# Patient Record
Sex: Female | Born: 1990 | Hispanic: Yes | Marital: Married | Smoking: Never smoker
Health system: Southern US, Community
[De-identification: ages and names within clinical notes are randomized; demographics above are authoritative.]

## PROBLEM LIST (undated history)

## (undated) DIAGNOSIS — E039 Hypothyroidism, unspecified: Secondary | ICD-10-CM

## (undated) DIAGNOSIS — O139 Gestational [pregnancy-induced] hypertension without significant proteinuria, unspecified trimester: Secondary | ICD-10-CM

## (undated) DIAGNOSIS — E119 Type 2 diabetes mellitus without complications: Secondary | ICD-10-CM

## (undated) HISTORY — DX: Hypothyroidism, unspecified: E03.9

---

## 2012-05-22 ENCOUNTER — Encounter (HOSPITAL_COMMUNITY): Payer: Self-pay | Admitting: *Deleted

## 2012-05-22 ENCOUNTER — Inpatient Hospital Stay (HOSPITAL_COMMUNITY)
Admission: AD | Admit: 2012-05-22 | Discharge: 2012-05-22 | Disposition: A | Discharge status: Home / Self Care | Payer: Self-pay | Source: Ambulatory Visit | Attending: Obstetrics & Gynecology | Admitting: Obstetrics & Gynecology

## 2012-05-22 ENCOUNTER — Inpatient Hospital Stay (HOSPITAL_COMMUNITY): Payer: Self-pay

## 2012-05-22 DIAGNOSIS — O00109 Unspecified tubal pregnancy without intrauterine pregnancy: Secondary | ICD-10-CM | POA: Insufficient documentation

## 2012-05-22 DIAGNOSIS — O039 Complete or unspecified spontaneous abortion without complication: Secondary | ICD-10-CM | POA: Insufficient documentation

## 2012-05-22 DIAGNOSIS — O209 Hemorrhage in early pregnancy, unspecified: Secondary | ICD-10-CM | POA: Insufficient documentation

## 2012-05-22 HISTORY — DX: Type 2 diabetes mellitus without complications: E11.9

## 2012-05-22 LAB — URINALYSIS, ROUTINE W REFLEX MICROSCOPIC
Ketones, ur: NEGATIVE mg/dL
Protein, ur: NEGATIVE mg/dL
Urobilinogen, UA: 0.2 mg/dL (ref 0.0–1.0)

## 2012-05-22 LAB — WET PREP, GENITAL
Clue Cells Wet Prep HPF POC: NONE SEEN
Trich, Wet Prep: NONE SEEN
Yeast Wet Prep HPF POC: NONE SEEN

## 2012-05-22 LAB — ABO/RH: ABO/RH(D): O POS

## 2012-05-22 LAB — CBC WITH DIFFERENTIAL/PLATELET
Basophils Absolute: 0 10*3/uL (ref 0.0–0.1)
Basophils Relative: 0 % (ref 0–1)
Eosinophils Absolute: 0.1 10*3/uL (ref 0.0–0.7)
Hemoglobin: 13.3 g/dL (ref 12.0–15.0)
MCHC: 34.3 g/dL (ref 30.0–36.0)
Neutro Abs: 4.8 10*3/uL (ref 1.7–7.7)
Neutrophils Relative %: 60 % (ref 43–77)
Platelets: 249 10*3/uL (ref 150–400)
RDW: 13.5 % (ref 11.5–15.5)

## 2012-05-22 LAB — URINE MICROSCOPIC-ADD ON

## 2012-05-22 IMAGING — US US OB COMP LESS 14 WK
1 series · 14 of 28 positions shown · non-contrast
Comparison: None.

CLINICAL DATA: Early pregnancy, vaginal bleeding, beta HCG 94

OBSTETRIC <14 WK US AND TRANSVAGINAL OB US
TECHNIQUE: Both transabdominal and transvaginal ultrasound
examinations were performed for complete evaluation of the
gestation as well as the maternal uterus, adnexal regions, and
pelvic cul-de-sac.  Transvaginal technique was performed to assess
early pregnancy.

[Series 1: us ob comp less 14 wks · 14 of 48 slices shown]
[im 2/48]
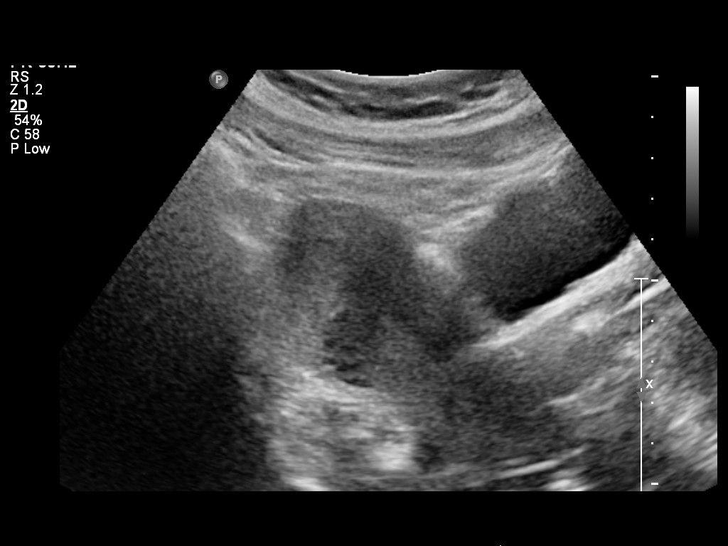
[im 6/48]
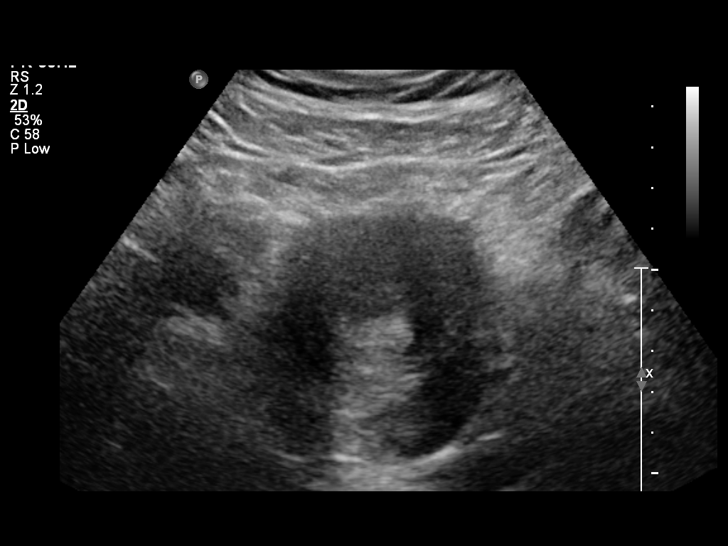
[im 9/48]
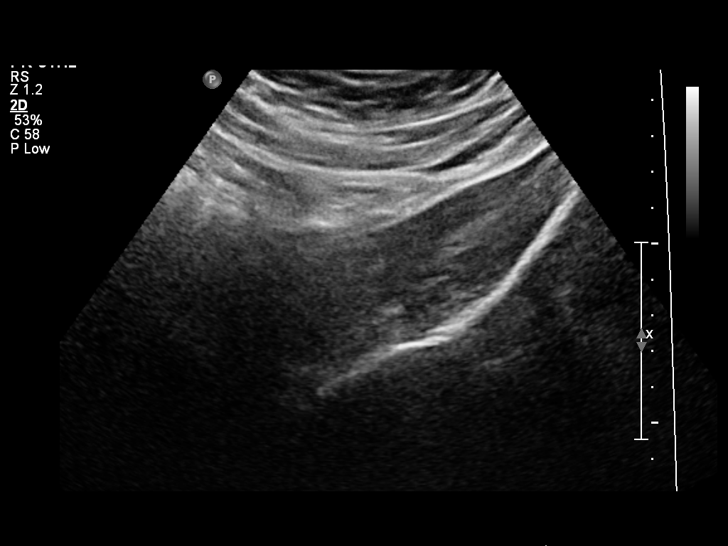
[im 13/48]
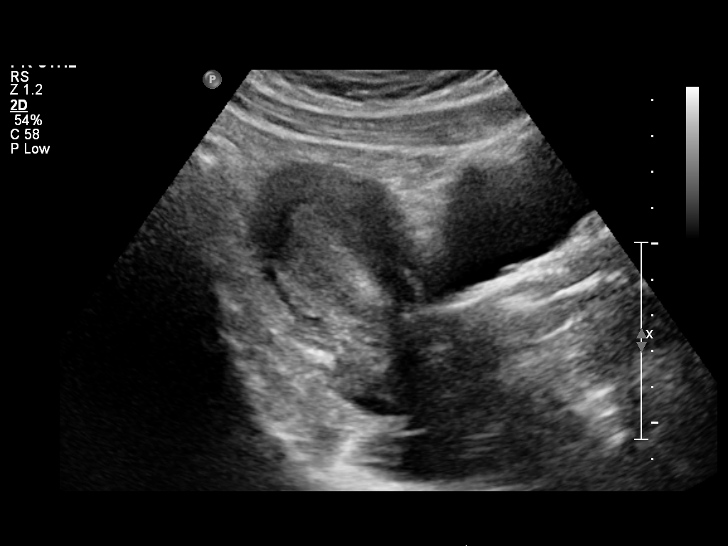
[im 16/48]
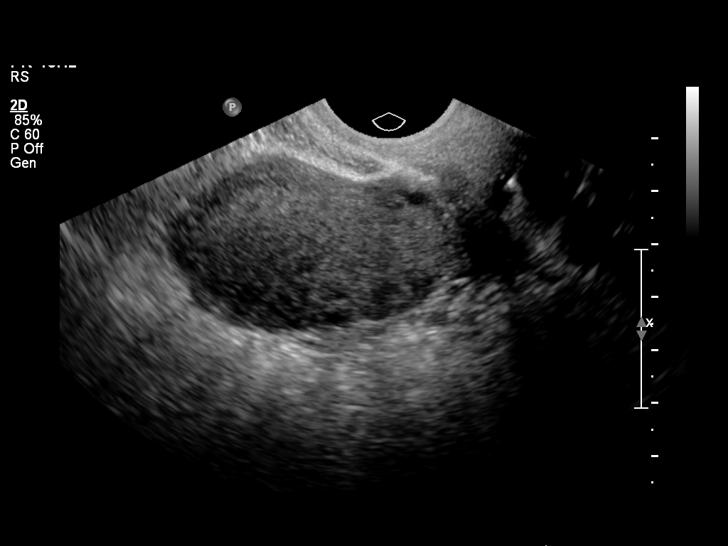
[im 20/48]
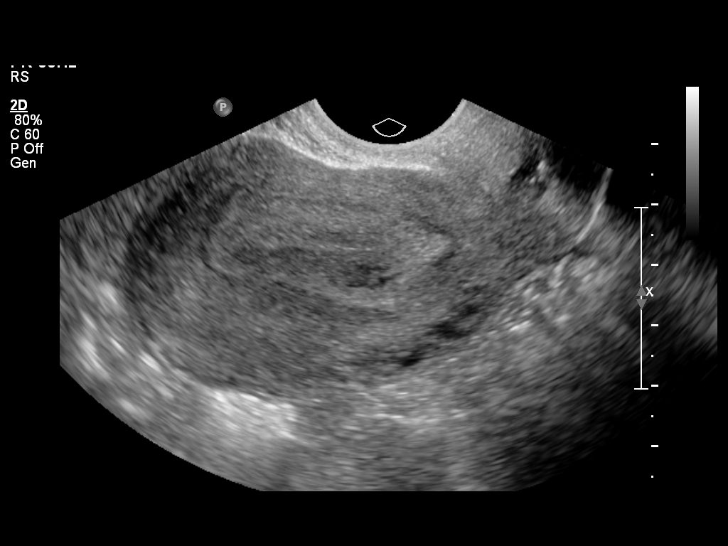
[im 23/48]
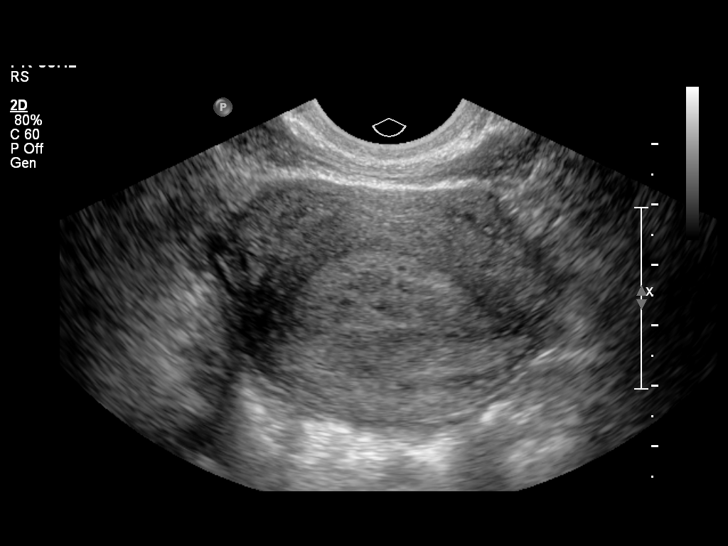
[im 27/48]
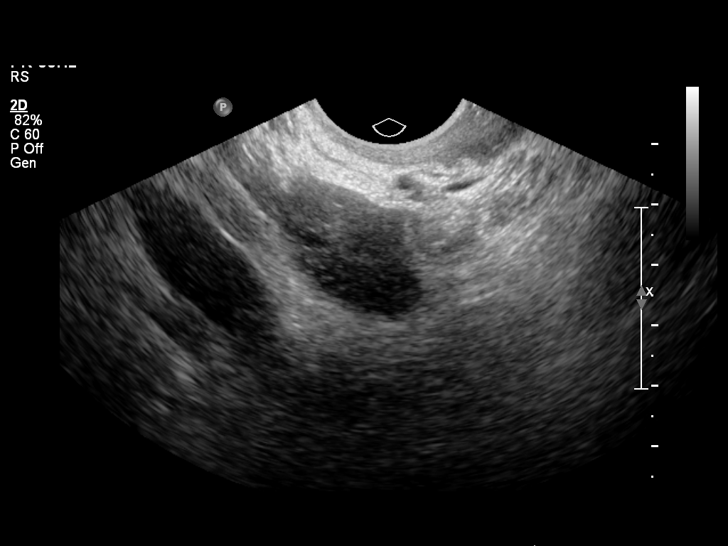
[im 30/48]
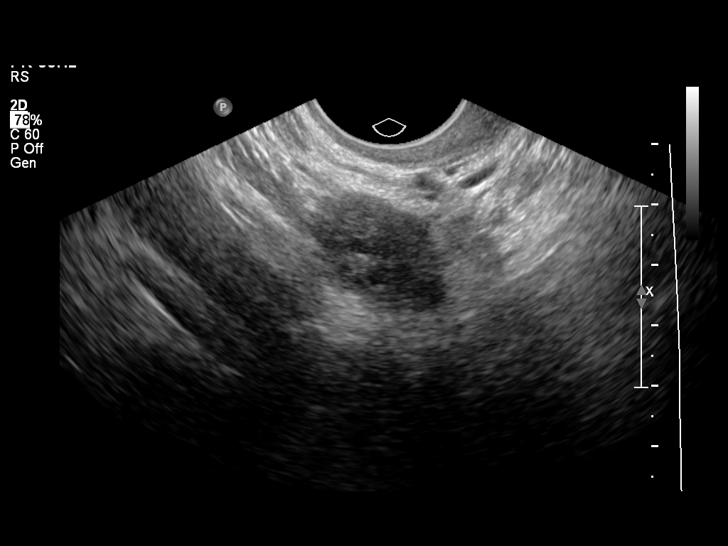
[im 34/48]
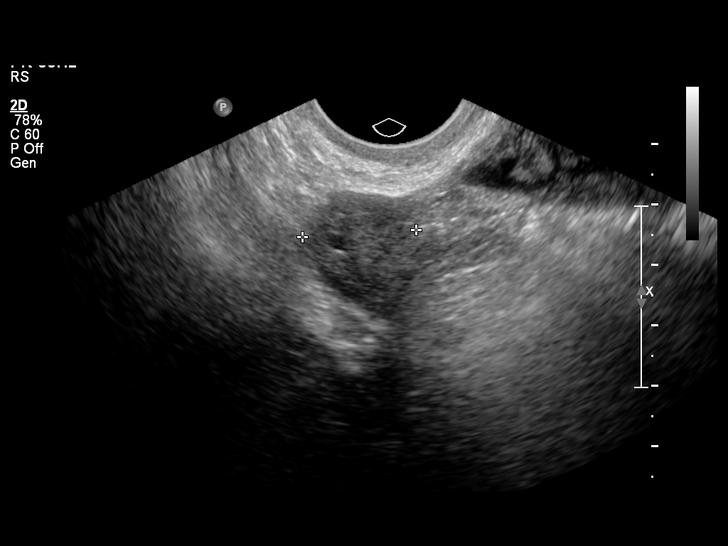
[im 37/48]
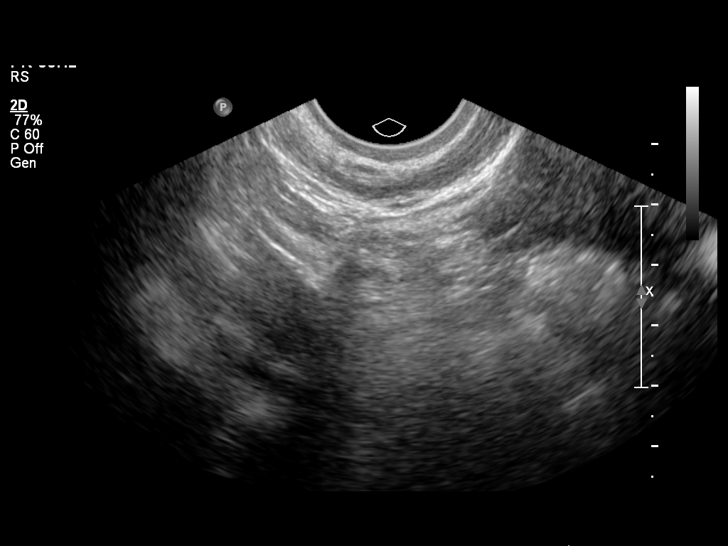
[im 41/48]
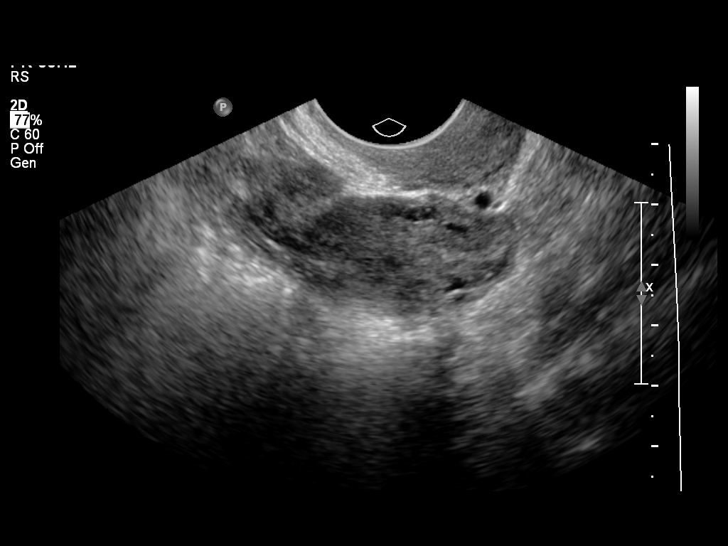
[im 44/48]
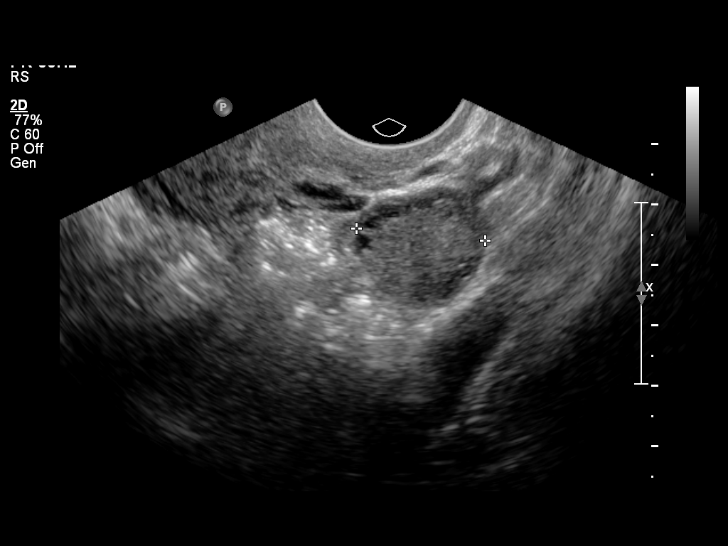
[im 48/48]
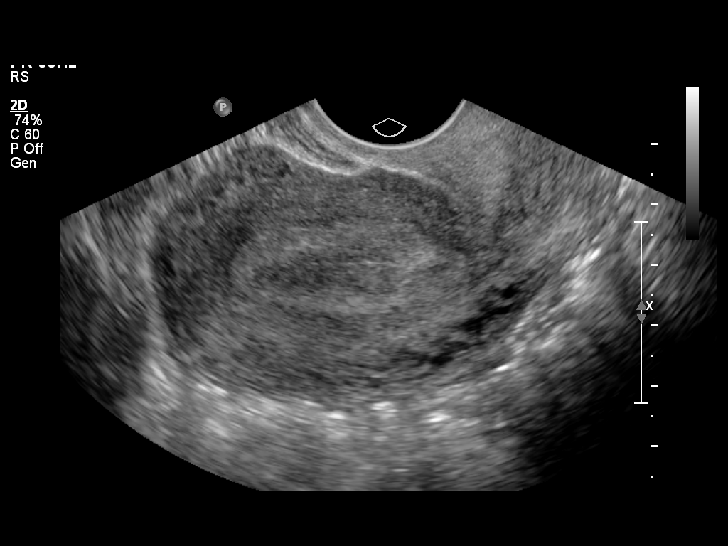

[14 of 28 positions shown; findings below may reference images not displayed]

Intrauterine gestational sac:  Not visualized.

Maternal uterus/adnexae:
[place] complex measures 1.5 cm in thickness.

Right ovary is within normal limits, measuring 2.8 x 1.9 x 1.9 cm.

Left ovary is within normal limits, measuring 2.0 x 3.3 x 2.1 cm.

No free fluid.
IMPRESSION: No IUP is seen.  This is not unexpected given the low beta HCG.

By definition, this reflects a pregnancy of unknown location.
Differential considerations include early IUP, abnormal IUP, or
nonvisualized ectopic pregnancy.

Serial beta HCG is suggested, supplemented by repeat ultrasound in
14 days.

## 2012-05-22 NOTE — MAU Provider Note (Signed)
Attestation of Attending Supervision of Advanced Practitioner (CNM/NP): Evaluation and management procedures were performed by the Advanced Practitioner under my supervision and collaboration. I have reviewed the Advanced Practitioner's note and chart, and I agree with the management and plan.  Texie Tupou H. 5:28 PM   

## 2012-05-22 NOTE — MAU Provider Note (Signed)
History     CSN: [id number]  Arrival date and time: 05/22/12 1451   None     Chief Complaint  Patient presents with  . Vaginal Bleeding   HPI Regina Jones Regina Jones is a 21 y.o. female who presents to MAU with vaginal bleeding.  She is early pregnant. The bleeding started 2 days ago. She describes the bleeding as less than a period.  Associated symptoms include lower abdominal pain that she rates as 4/10. The pain comes and goes. Last pap smear less than one year and was normal. Current sex partner x 6 years. No history of STI's. The history was provided by the patient using the Jones Spanish translator.  OB History    Grav Para Term Preterm Abortions TAB SAB Ect Mult Living   1               Past Medical History  Diagnosis Date  . Diabetes mellitus without complication   . Thyroid condition     History reviewed. No pertinent past surgical history.  History reviewed. No pertinent family history.  History  Substance Use Topics  . Smoking status: Never Smoker   . Smokeless tobacco: Not on file  . Alcohol Use: No    Allergies: No Known Allergies  Prescriptions prior to admission  Medication Sig Dispense Refill  . levothyroxine (SYNTHROID, LEVOTHROID) 25 MCG tablet Take 25 mcg by mouth daily.      . Prenatal Vit-Fe Fumarate-FA (PRENATAL MULTIVITAMIN) TABS Take 1 tablet by mouth at bedtime.        Review of Systems  Constitutional: Negative for fever, chills and weight loss.  HENT: Negative for ear pain, nosebleeds, congestion, sore throat and neck pain.   Eyes: Negative for blurred vision, double vision, photophobia and pain.  Respiratory: Negative for cough, shortness of breath and wheezing.   Cardiovascular: Negative for chest pain, palpitations and leg swelling.  Gastrointestinal: Positive for heartburn and abdominal pain. Negative for nausea, vomiting, diarrhea and constipation.  Genitourinary: Positive for dysuria and frequency. Negative for urgency.    Musculoskeletal: Positive for back pain. Negative for myalgias.  Skin: Negative for itching and rash.  Neurological: Negative for dizziness, sensory change, speech change, seizures, weakness and headaches.  Endo/Heme/Allergies: Does not bruise/bleed easily.  Psychiatric/Behavioral: Negative for depression. The patient is not nervous/anxious and does not have insomnia.    Physical Exam   Blood pressure 131/88, pulse 71, temperature 98.2 F (36.8 C), temperature source Oral, resp. rate 16, last menstrual period 03/19/2012.  Physical Exam  Nursing note and vitals reviewed. Constitutional: She is oriented to person, place, and time. She appears well-developed and well-nourished. No distress.  HENT:  Head: Normocephalic and atraumatic.  Eyes: EOM are normal.  Neck: Neck supple.  Cardiovascular: Normal rate.   Respiratory: Effort normal.  GI: Soft. There is no tenderness.  Genitourinary:       External genitalia without lesions. Small blood vaginal vault. Cervix inflamed, blood tinged mucous discharge from cervix. No CMT, no adnexal tenderness. Uterus without palpable enlargement.  Musculoskeletal: Normal range of motion.  Neurological: She is alert and oriented to person, place, and time.  Skin: Skin is warm and dry.  Psychiatric: She has a normal mood and affect. Her behavior is normal. Judgment and thought content normal.   Results for orders placed during the Jones encounter of 05/22/12 (from the past 24 hour(s))  URINALYSIS, ROUTINE W REFLEX MICROSCOPIC     Status: Abnormal   Collection Time   05/22/12  3:00 PM      Component Value Range   Color, Urine YELLOW  YELLOW   APPearance CLEAR  CLEAR   Specific Gravity, Urine 1.010  1.005 - 1.030   pH 6.0  5.0 - 8.0   Glucose, UA NEGATIVE  NEGATIVE mg/dL   Hgb urine dipstick TRACE (*) NEGATIVE   Bilirubin Urine NEGATIVE  NEGATIVE   Ketones, ur NEGATIVE  NEGATIVE mg/dL   Protein, ur NEGATIVE  NEGATIVE mg/dL   Urobilinogen, UA 0.2   0.0 - 1.0 mg/dL   Nitrite NEGATIVE  NEGATIVE   Leukocytes, UA NEGATIVE  NEGATIVE  URINE MICROSCOPIC-ADD ON     Status: Abnormal   Collection Time   05/22/12  3:00 PM      Component Value Range   Squamous Epithelial / LPF FEW (*) RARE   WBC, UA 0-2  <3 WBC/hpf   RBC / HPF 0-2  <3 RBC/hpf   Bacteria, UA FEW (*) RARE  POCT PREGNANCY, URINE     Status: Normal   Collection Time   05/22/12  3:11 PM      Component Value Range   Preg Test, Ur NEGATIVE  NEGATIVE  WET PREP, GENITAL     Status: Abnormal   Collection Time   05/22/12  3:45 PM      Component Value Range   Yeast Wet Prep HPF POC NONE SEEN  NONE SEEN   Trich, Wet Prep NONE SEEN  NONE SEEN   Clue Cells Wet Prep HPF POC NONE SEEN  NONE SEEN   WBC, Wet Prep HPF POC FEW (*) NONE SEEN  CBC WITH DIFFERENTIAL     Status: Normal   Collection Time   05/22/12  3:51 PM      Component Value Range   WBC 7.9  4.0 - 10.5 K/uL   RBC 4.11  3.87 - 5.11 MIL/uL   Hemoglobin 13.3  12.0 - 15.0 g/dL   HCT 16.1  09.6 - 04.5 %   MCV 94.4  78.0 - 100.0 fL   MCH 32.4  26.0 - 34.0 pg   MCHC 34.3  30.0 - 36.0 g/dL   RDW 40.9  81.1 - 91.4 %   Platelets 249  150 - 400 K/uL   Neutrophils Relative 60  43 - 77 %   Neutro Abs 4.8  1.7 - 7.7 K/uL   Lymphocytes Relative 31  12 - 46 %   Lymphs Abs 2.5  0.7 - 4.0 K/uL   Monocytes Relative 6  3 - 12 %   Monocytes Absolute 0.5  0.1 - 1.0 K/uL   Eosinophils Relative 2  0 - 5 %   Eosinophils Absolute 0.1  0.0 - 0.7 K/uL   Basophils Relative 0  0 - 1 %   Basophils Absolute 0.0  0.0 - 0.1 K/uL  HCG, QUANTITATIVE, PREGNANCY     Status: Abnormal   Collection Time   05/22/12  3:51 PM      Component Value Range   hCG, Beta Chain, Quant, S 94 (*) <5 mIU/mL  ABO/RH     Status: Normal (Preliminary result)   Collection Time   05/22/12  3:51 PM      Component Value Range   ABO/RH(D) O POS     MAU Course  Procedures Ultrasound shows no IUP or adnexal mass  Assessment: 21 y.o. female with bleeding in early  pregnancy  Diff Dx: Early IUP   SAB   Ectopic pregnancy  Plan:  Repeat Bhcg in  48 hours   Ectopic precautions  Regina Lesesne, RN, FNP, [facility] 05/22/2012, 4:42 PM

## 2012-05-23 ENCOUNTER — Inpatient Hospital Stay (HOSPITAL_COMMUNITY)
Admission: AD | Admit: 2012-05-23 | Discharge: 2012-05-23 | Disposition: A | Discharge status: Home / Self Care | Payer: Self-pay | Source: Ambulatory Visit | Attending: Obstetrics & Gynecology | Admitting: Obstetrics & Gynecology

## 2012-05-23 ENCOUNTER — Encounter (HOSPITAL_COMMUNITY): Payer: Self-pay

## 2012-05-23 DIAGNOSIS — O039 Complete or unspecified spontaneous abortion without complication: Secondary | ICD-10-CM | POA: Insufficient documentation

## 2012-05-23 LAB — HCG, QUANTITATIVE, PREGNANCY: hCG, Beta Chain, Quant, S: 70 m[IU]/mL — ABNORMAL HIGH (ref <5)

## 2012-05-23 MED ORDER — IBUPROFEN 600 MG PO TABS: 600.0000 mg | ORAL_TABLET | Freq: Four times a day (QID) | ORAL | Status: DC | PRN

## 2012-05-23 NOTE — MAU Note (Signed)
Patient is in with c/o increased abdominal pain and bleeding (the pad that she placed at 0400am at home have streak of blood). She was seen yesterday with the low bhcg and no iup visualized in ultrasound. Her abdomen is non tender to touch.

## 2012-05-23 NOTE — MAU Provider Note (Signed)
History   Chief Complaint:  Vaginal Bleeding   Regina Jones Regina Jones is  21 y.o. G1P0 Patient's last menstrual period was 03/19/2012.Marland Kitchen Seen in MAU yesterday for abd pain and bleeding in early pregnancy. US showed nothing in uterus or adnexa. Patient is here for worsening bleeding, passing clots and cramping, now significantly improved and ongoing surveillance of pregnancy status. She is [redacted]w[redacted]d weeks gestation  by LMP.  Pain 5/10 on pain scale.   General ROS:  negative  Her previous Quantitative HCG values are:  05/22/12: 94   Physical Exam   Blood pressure 140/80, pulse 73, temperature 97 F (36.1 C), temperature source Oral, resp. rate 18, last menstrual period 03/19/2012.  Focused Gynecological Exam: examination not indicated  Labs: Results for orders placed during the hospital encounter of 05/23/12 (from the past 24 hour(s))  HCG, QUANTITATIVE, PREGNANCY     Status: Abnormal   Collection Time   05/23/12  6:18 AM      Component Value Range   hCG, Beta Chain, Quant, S 70 (*) <5 mIU/mL   Ultrasound Studies:   NA  Assessment: 1. SAB (spontaneous abortion)   2.  IUP not confirmed  Plan: Follow-up Information    Follow up with [facility]. In 1 week. (for bloodwork) until quant hcg <1   Contact information:   [address] [place] [address] [phone]      Follow up with [facility] [place] [facility]ERN[facility] ADMISSIONS. (As needed if symptoms worsen)    Contact information:   [address] [id number] mc [place] [address] [phone]          Medication List     As of 05/23/2012  7:48 AM    START taking these medications         ibuprofen 600 MG tablet   Commonly known as: ADVIL,MOTRIN   Take 1 tablet (600 mg total) by mouth every 6 (six) hours as needed for pain.      CONTINUE taking these medications         levothyroxine 25 MCG tablet   Commonly known as: SYNTHROID, LEVOTHROID     prenatal multivitamin Tabs          Where to get your medications    These are the prescriptions that you need to pick up.   You may get these medications from any pharmacy.         ibuprofen 600 MG tablet            Regina Jones 05/23/2012, 6:05 AM

## 2012-05-24 LAB — POCT PREGNANCY, URINE: Preg Test, Ur: POSITIVE — AB

## 2012-05-31 ENCOUNTER — Other Ambulatory Visit: Payer: Self-pay

## 2012-05-31 DIAGNOSIS — O039 Complete or unspecified spontaneous abortion without complication: Secondary | ICD-10-CM

## 2012-06-07 NOTE — MAU Provider Note (Signed)
Attestation of Attending Supervision of Advanced Practitioner (CNM/NP): Evaluation and management procedures were performed by the Advanced Practitioner under my supervision and collaboration. I have reviewed the Advanced Practitioner's note and chart, and I agree with the management and plan.  LEGGETT,KELLY H. 9:23 PM

## 2012-06-16 ENCOUNTER — Ambulatory Visit (INDEPENDENT_AMBULATORY_CARE_PROVIDER_SITE_OTHER): Payer: Self-pay | Admitting: Obstetrics and Gynecology

## 2012-06-16 ENCOUNTER — Encounter: Payer: Self-pay | Admitting: Obstetrics and Gynecology

## 2012-06-16 VITALS — BP 125/84 | HR 81 | Temp 96.6°F | Resp 20 | Wt 154.1 lb

## 2012-06-16 DIAGNOSIS — O039 Complete or unspecified spontaneous abortion without complication: Secondary | ICD-10-CM

## 2012-06-16 NOTE — Progress Notes (Signed)
Pt here for f/u SAB

## 2012-06-16 NOTE — Patient Instructions (Signed)
Aborto espontneo  (Miscarriage) El aborto espontneo es la prdida de un beb que no ha nacido (feto) antes de la semana 20 del embarazo. La mayor parte de estos abortos ocurre en los primeros 3 meses. En algunos casos ocurre antes de que la mujer sepa que est embarazada. Tambin se denomina "aborto espontneo" o "prdida prematura del embarazo". El aborto espontneo puede ser una experiencia que afecte emocionalmente a la persona. Converse con su mdico si tiene dudas, cmo es el proceso de duelo, y sobre planes futuros de embarazo.  CAUSAS   Algunos problemas cromosmicos pueden hacer imposible que el beb se desarrolle normalmente. Los problemas con los genes o cromosomas del beb son generalmente el resultado de errores que se producen, por casualidad, cuando el embrin se divide y crece. Estos problemas no se heredan de los padres.  Infeccin en el cuello del tero.   Problemas hormonales.   Problemas en el cuello del tero, como tener un tero incompetente. Esto ocurre cuando los tejidos no son lo suficientemente fuertes como para contener el embarazo.   Problemas del tero, como un tero con forma anormal, los fibromas o anormalidades congnitas.   Ciertas enfermedades crnicas.   No fume, no beba alcohol, ni consuma drogas.   Traumatismos  A veces, la causa es desconocida.  SNTOMAS   Sangrado o manchado vaginal, con o sin clicos o dolor.  Dolor o clicos en el abdomen o en la cintura.  Eliminacin de lquido, tejidos o cogulos grandes por la vagina. DIAGNSTICO  El mdico le har un examen fsico. Tambin le indicar una ecografa para confirmar el aborto. Es posible que se realicen anlisis de sangre.  TRATAMIENTO   En algunos casos el tratamiento no es necesario, si se eliminan naturalmente todos los tejidos embrionarios que se encontraban en el tero. Si el feto o la placenta quedan dentro del tero (aborto incompleto), pueden infectarse, los tejidos que quedan  pueden infectarse y deben retirarse. Generalmente se realiza un procedimiento de dilatacin y curetaje (D y C). Durante el procedimiento de dilatacin y curetaje, el cuello del tero se abre (dilata) y se retira cualquier resto de tejido fetal o placentario del tero.  Si hay una infeccin, le recetarn antibiticos. Podrn recetarle otros medicamentos para reducir el tamao del tero (contraerlo) si hay una mucho sangrado.  Si su sangre es Rh negativa y su beb es Rh positivo, usted necesitar la inyeccin de inmunoglobulina Rh. Esta inyeccin proteger a los futuros bebs de tener problemas de compatibilidad Rh en futuros embarazos. INSTRUCCIONES PARA EL CUIDADO EN EL HOGAR   El mdico le indicar reposo en cama o le permitir realizar actividades livianas. Vuelva a la actividad lentamente o segn las indicaciones de su mdico.  Pdale a alguien que la ayude con las responsabilidades familiares y del hogar durante este tiempo.   Lleve un registro de la cantidad y la saturacin de las toallas higinicas que utiliza cada da. Anote esta informacin   No use tampones. No No se haga duchas vaginales ni tenga relaciones sexuales hasta que el mdico la autorice.   Slo tome medicamentos de venta libre o recetados para calmar el dolor o el malestar, segn las indicaciones de su mdico.   No tome aspirina. La aspirina puede ocasionar hemorragias.   Concurra puntualmente a las citas de control con el mdico.   Si usted o su pareja tienen dificultades con el duelo, hable con su mdico para buscar la ayuda psicolgica que los ayude a enfrentar la prdida   del embarazo. Permtase el tiempo suficiente de duelo antes de quedar embarazada nuevamente.  SOLICITE ATENCIN MDICA DE INMEDIATO SI:   Siente calambres intensos o dolor en la espalda o en el abdomen.  Tiene fiebre.  Elimina grandes cogulos de sangre (del tamao de una nuez o ms) o tejidos por la vagina. Guarde lo que ha eliminado para  que su mdico lo examine.   La hemorragia aumenta.   Observa una secrecin vaginal espesa y con mal olor.  Se siente mareada, dbil, o se desmaya.   Siente escalofros.  ASEGRESE DE QUE:   Comprende estas instrucciones.  Controlar su enfermedad.  Solicitar ayuda de inmediato si no mejora o si empeora. Document Released: 04/02/2005 Document Revised: 12/23/2011 [employer] Patient Information 2013 [employer].  

## 2012-06-17 NOTE — Progress Notes (Signed)
CC: Referral    None     HPI Regina Jones Regina Jones is a 21 y.o. G1P0010 who is here for F/U probable SAB. Had hx bleeding heavier than a period then came to MAU and had quant of 70 and [place] as below.  Past Medical History  Diagnosis Date  . Thyroid condition   . Diabetes mellitus without complication     OB History    Grav Para Term Preterm Abortions TAB SAB Ect Mult Living   1              # Outc Date GA Lbr Len/2nd Wgt Sex Del Anes PTL Lv   1 GRA            Comments: System Generated. Please review and update pregnancy details.      History reviewed. No pertinent past surgical history.  History   Social History  . Marital Status: Married    Spouse Name: N/A    Number of Children: N/A  . Years of Education: N/A   Occupational History  . Not on file.   Social History Main Topics  . Smoking status: Never Smoker   . Smokeless tobacco: Not on file  . Alcohol Use: No  . Drug Use: No  . Sexually Active: Not Currently    Birth Control/ Protection: None     Comment: desires to conceive in6 mos- will use condoms   Other Topics Concern  . Not on file   Social History Narrative  . No narrative on file    Current Outpatient Prescriptions on File Prior to Visit  Medication Sig Dispense Refill  . levothyroxine (SYNTHROID, LEVOTHROID) 25 MCG tablet Take 25 mcg by mouth daily.      . metFORMIN (GLUCOPHAGE) 500 MG tablet Take 500 mg by mouth 2 (two) times daily with a meal.      . Prenatal Vit-Fe Fumarate-FA (PRENATAL MULTIVITAMIN) TABS Take 1 tablet by mouth at bedtime.      Marland Kitchen ibuprofen (ADVIL,MOTRIN) 600 MG tablet Take 1 tablet (600 mg total) by mouth every 6 (six) hours as needed for pain.  30 tablet  1    No Known Allergies  ROS Pertinent items in HPI  PHYSICAL EXAM Filed Vitals:   06/16/12 1550  BP: 125/84  Pulse: 81  Temp: 96.6 F (35.9 C)  Resp: 20   General: Well nourished, well developed female in no acute distress Cardiovascular: Normal  rate Respiratory: Normal effort Abdomen: Soft, nontender Back: No CVAT Extremities: No edema Neurologic: Alert and oriented Speculum exam: NEFG; vagina with physiologic discharge, no blood; cervix clean Bimanual exam: cervix closed, no CMT; uterus NSSP; no adnexal tenderness or masses  LAB RESULTS No results found for this or any previous visit (from the past 24 hour(s)).  IMAGING US Ob Comp Less 14 Wks  05/22/2012  *RADIOLOGY REPORT*  Clinical Data: Early pregnancy, vaginal bleeding, beta HCG 94  OBSTETRIC <14 WK [place] AND TRANSVAGINAL OB US  Technique:  Both transabdominal and transvaginal ultrasound examinations were performed for complete evaluation of the gestation as well as the maternal uterus, adnexal regions, and pelvic cul-de-sac.  Transvaginal technique was performed to assess early pregnancy.  Comparison:  None.  Intrauterine gestational sac:  Not visualized.  Maternal uterus/adnexae: Regina Jones measures 1.5 cm in thickness.  Right ovary is within normal limits, measuring 2.8 x 1.9 x 1.9 cm.  Left ovary is within normal limits, measuring 2.0 x 3.3 x 2.1 cm.  No free fluid.  IMPRESSION: No IUP is seen.  This is not unexpected given the low beta HCG.  By definition, this reflects a pregnancy of unknown location. Differential considerations include early IUP, abnormal IUP, or nonvisualized ectopic pregnancy.  Serial beta HCG is suggested, supplemented by repeat ultrasound in 14 days.   Original Report Authenticated By: Regina Jones, M.D.    US Ob Transvaginal  05/22/2012  *RADIOLOGY REPORT*  Clinical Data: Early pregnancy, vaginal bleeding, beta HCG 94  OBSTETRIC <14 WK [place] AND TRANSVAGINAL OB US  Technique:  Both transabdominal and transvaginal ultrasound examinations were performed for complete evaluation of the gestation as well as the maternal uterus, adnexal regions, and pelvic cul-de-sac.  Transvaginal technique was performed to assess early pregnancy.  Comparison:  None.   Intrauterine gestational sac:  Not visualized.  Maternal uterus/adnexae: Regina Jones measures 1.5 cm in thickness.  Right ovary is within normal limits, measuring 2.8 x 1.9 x 1.9 cm.  Left ovary is within normal limits, measuring 2.0 x 3.3 x 2.1 cm.  No free fluid.  IMPRESSION: No IUP is seen.  This is not unexpected given the low beta HCG.  By definition, this reflects a pregnancy of unknown location. Differential considerations include early IUP, abnormal IUP, or nonvisualized ectopic pregnancy.  Serial beta HCG is suggested, supplemented by repeat ultrasound in 14 days.   Original Report Authenticated By: Regina Jones, M.D.      ASSESSMENT  1. SAB (spontaneous abortion)     PLAN Quant bHCG sent. Will follow to 0. Discussed waiting 1 menstrual cycle before attempting another pregnancy and continue PNVs.  See AVS for patient education.      Regina Jones, CNM 06/17/2012  Addendum: Regina Jones is <2 and pt will be called that no more F/U blood work needed.  8:23 PM

## 2012-06-18 ENCOUNTER — Telehealth: Payer: Self-pay | Admitting: *Deleted

## 2012-06-18 NOTE — Telephone Encounter (Signed)
Called patient with Regina Jones, her voicemail is not set up.

## 2012-06-18 NOTE — Telephone Encounter (Signed)
Message copied by Mannie Stabile on Fri Jun 18, 2012 11:04 AM ------      Message from: Odelia Gage A      Created: Fri Jun 18, 2012  7:43 AM       This was sent to the [facility]. Call her with an appointment, and we can put it in if it's just a lab                        ----- Message -----         From: Danae Orleans, CNM         Sent: 06/17/2012   8:25 PM           To: [facility] Admin Pool            Please call to tell her the [name] is down and she will not need F/U

## 2012-06-23 NOTE — Telephone Encounter (Signed)
Called pt with Spanish interpreter, Byrd Hesselbach, and informed pt that her levels are where they should be and she does not need to come in for a f/u.  Pt stated understanding and did not have any further questions.

## 2012-08-26 ENCOUNTER — Other Ambulatory Visit (HOSPITAL_COMMUNITY): Payer: Self-pay | Admitting: Obstetrics

## 2012-08-26 DIAGNOSIS — O3680X0 Pregnancy with inconclusive fetal viability, not applicable or unspecified: Secondary | ICD-10-CM

## 2012-08-31 ENCOUNTER — Encounter (HOSPITAL_COMMUNITY): Payer: Self-pay

## 2012-08-31 ENCOUNTER — Ambulatory Visit (HOSPITAL_COMMUNITY)
Admission: RE | Admit: 2012-08-31 | Discharge: 2012-08-31 | Disposition: A | Discharge status: Home / Self Care | Payer: Self-pay | Source: Ambulatory Visit | Attending: Obstetrics | Admitting: Obstetrics

## 2012-08-31 VITALS — BP 131/84 | HR 82 | Wt 155.8 lb

## 2012-08-31 DIAGNOSIS — O24911 Unspecified diabetes mellitus in pregnancy, first trimester: Secondary | ICD-10-CM

## 2012-08-31 DIAGNOSIS — E039 Hypothyroidism, unspecified: Secondary | ICD-10-CM | POA: Insufficient documentation

## 2012-08-31 DIAGNOSIS — O3680X0 Pregnancy with inconclusive fetal viability, not applicable or unspecified: Secondary | ICD-10-CM

## 2012-08-31 DIAGNOSIS — E079 Disorder of thyroid, unspecified: Secondary | ICD-10-CM | POA: Insufficient documentation

## 2012-08-31 DIAGNOSIS — O24919 Unspecified diabetes mellitus in pregnancy, unspecified trimester: Secondary | ICD-10-CM | POA: Insufficient documentation

## 2012-08-31 DIAGNOSIS — Z3689 Encounter for other specified antenatal screening: Secondary | ICD-10-CM | POA: Insufficient documentation

## 2012-08-31 LAB — COMPREHENSIVE METABOLIC PANEL
AST: 17 U/L (ref 0–37)
Alkaline Phosphatase: 53 U/L (ref 39–117)
BUN: 7 mg/dL (ref 6–23)
CO2: 24 mEq/L (ref 19–32)
Chloride: 104 mEq/L (ref 96–112)
Creatinine, Ser: 0.56 mg/dL (ref 0.50–1.10)
GFR calc non Af Amer: >90 mL/min (ref >90)
Potassium: 3.4 mEq/L — ABNORMAL LOW (ref 3.5–5.1)
Total Bilirubin: 0.1 mg/dL — ABNORMAL LOW (ref 0.3–1.2)

## 2012-08-31 LAB — TSH: TSH: 2.95 u[IU]/mL (ref 0.350–4.500)

## 2012-08-31 LAB — T4, FREE: Free T4: 1.21 ng/dL (ref 0.80–1.80)

## 2012-08-31 IMAGING — US US OB COMP LESS 14 WK
1 series · 13 of 19 positions shown · non-contrast
Comparison: none

[Series 1: us ob comp less 14 wk · 0.16mm/px · 13 of 19 slices shown]
[im 1/19]
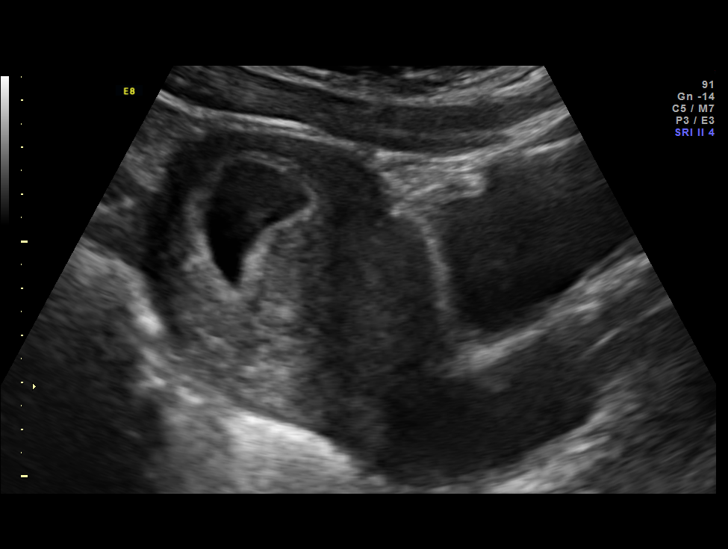
[im 3/19]
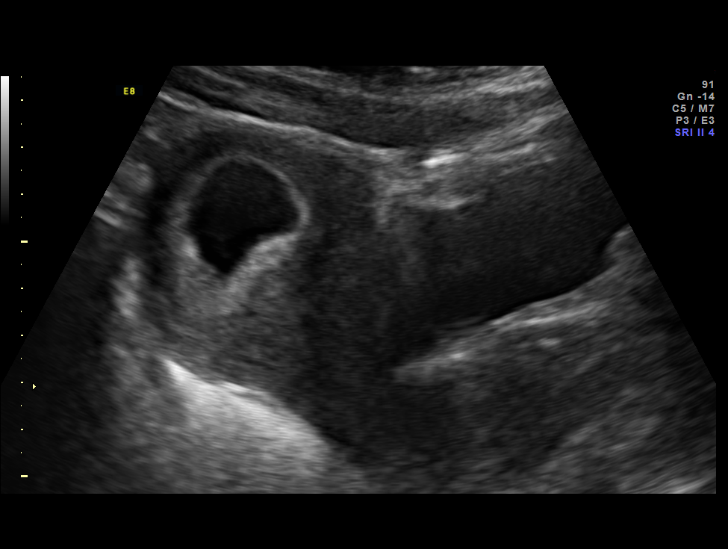
[im 4/19]
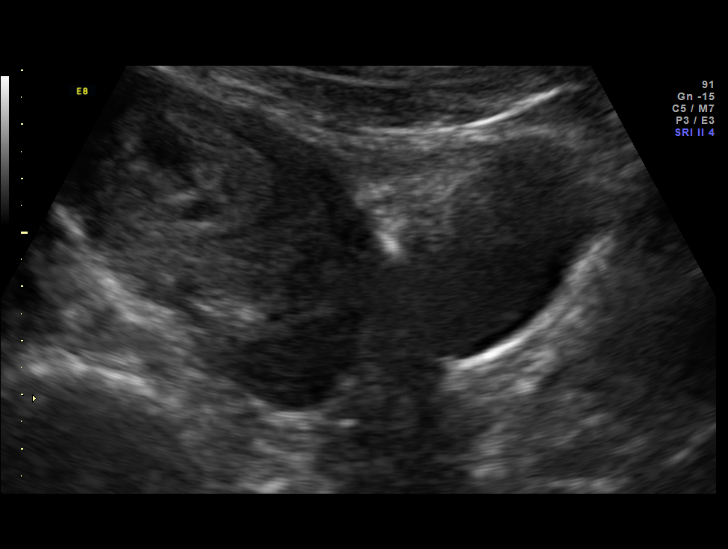
[im 6/19]
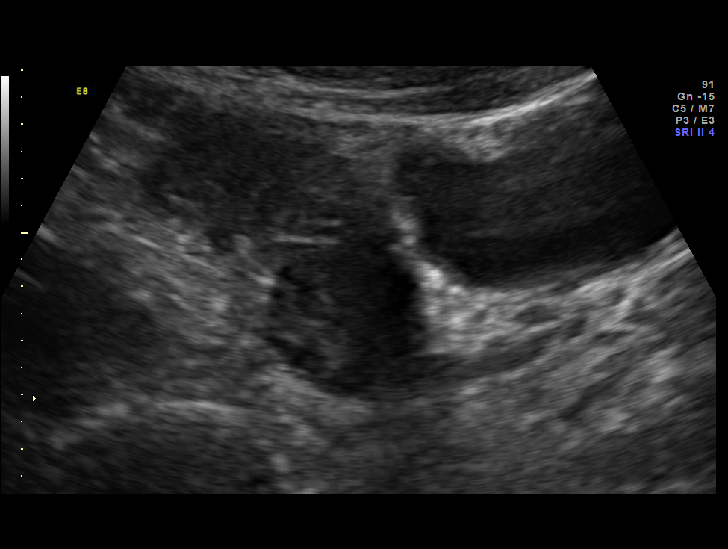
[im 7/19]
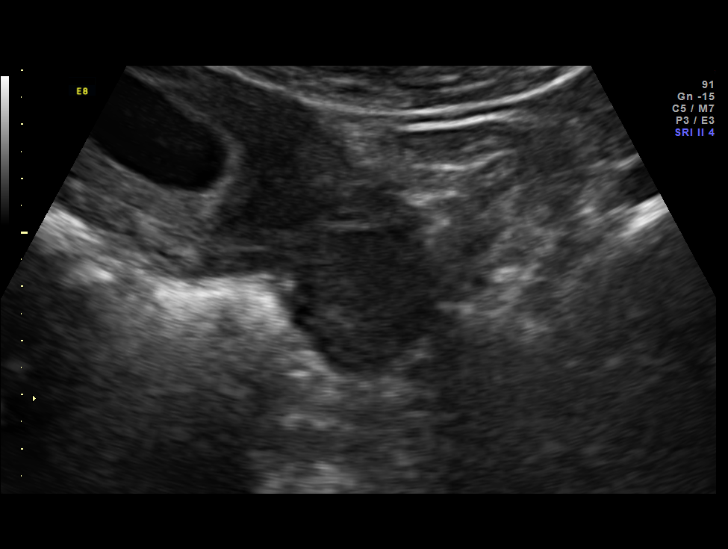
[im 9/19]
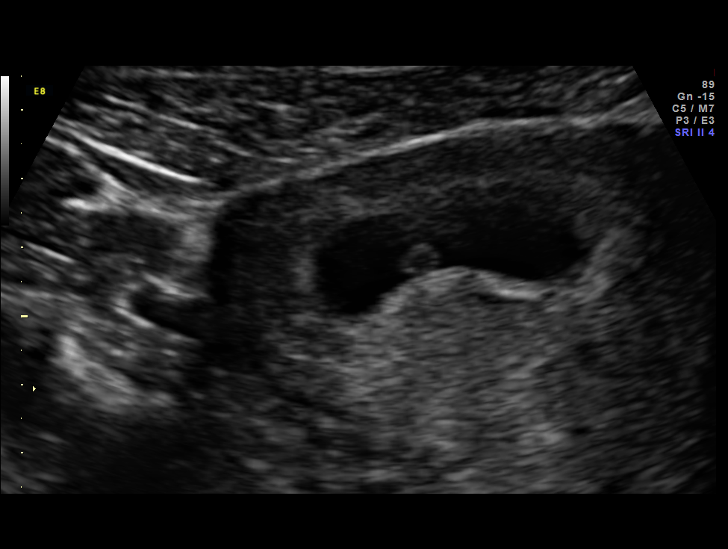
[im 10/19]
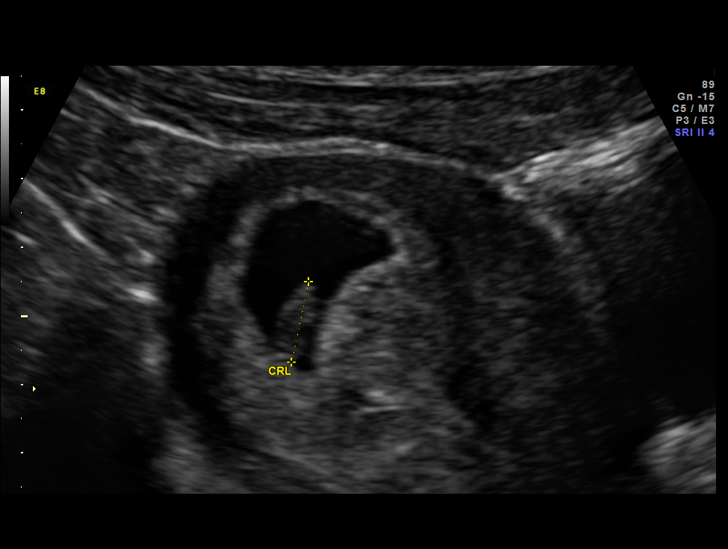
[im 11/19]
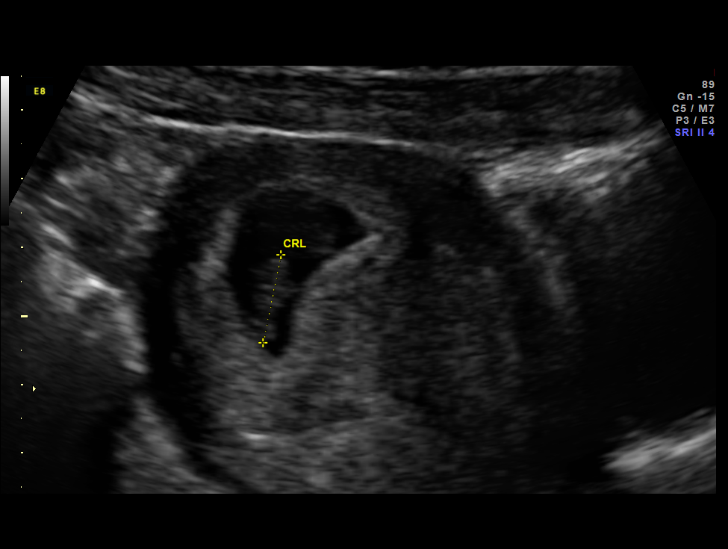
[im 13/19]
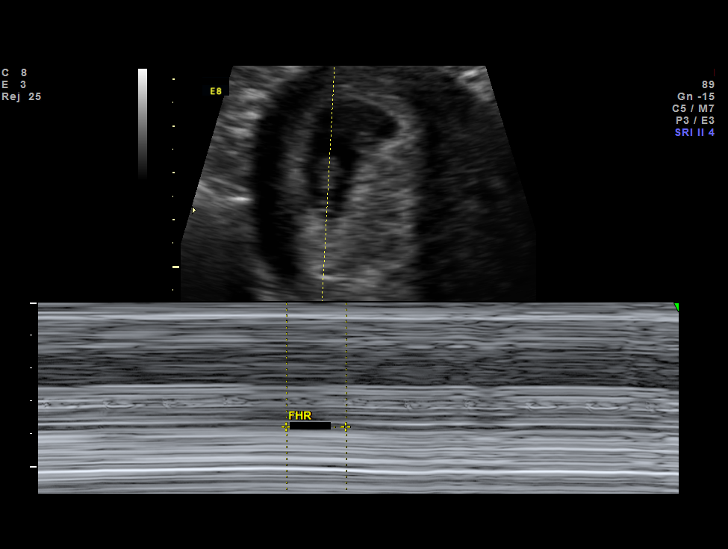
[im 14/19]
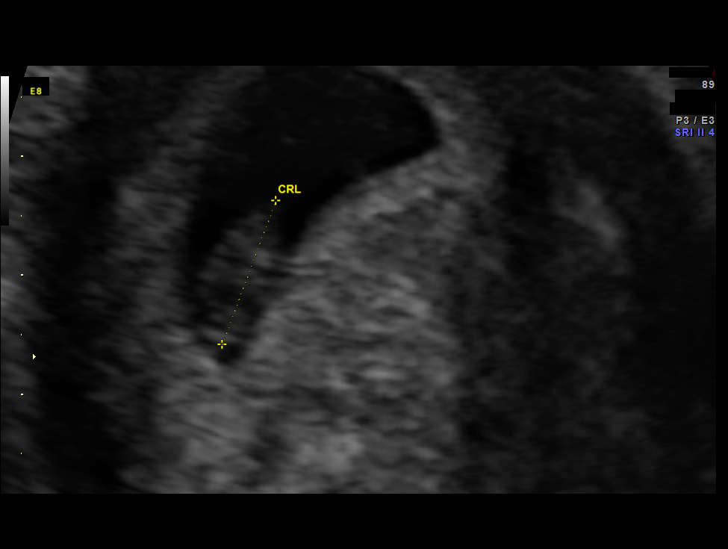
[im 16/19]
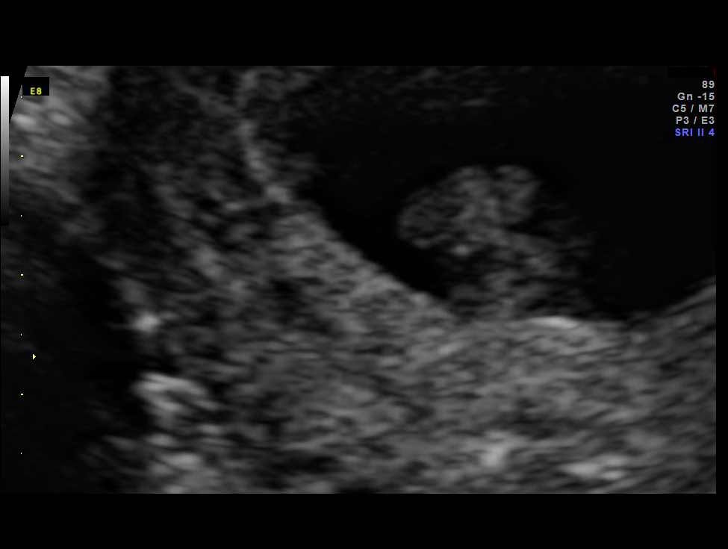
[im 17/19]
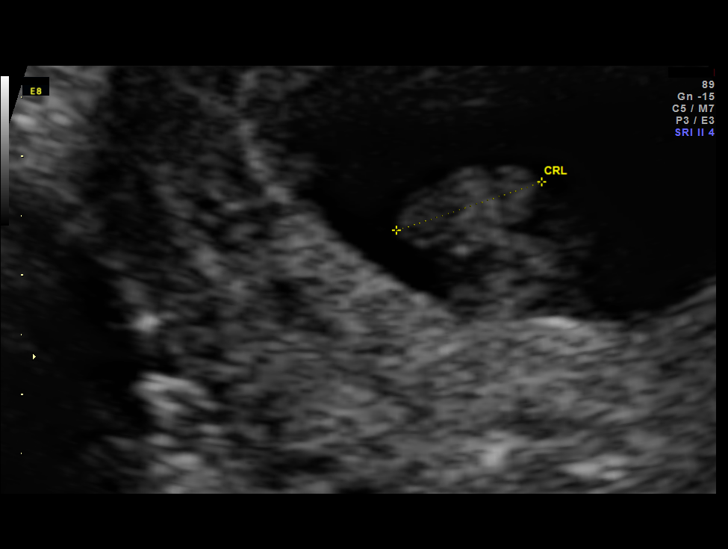
[im 19/19]
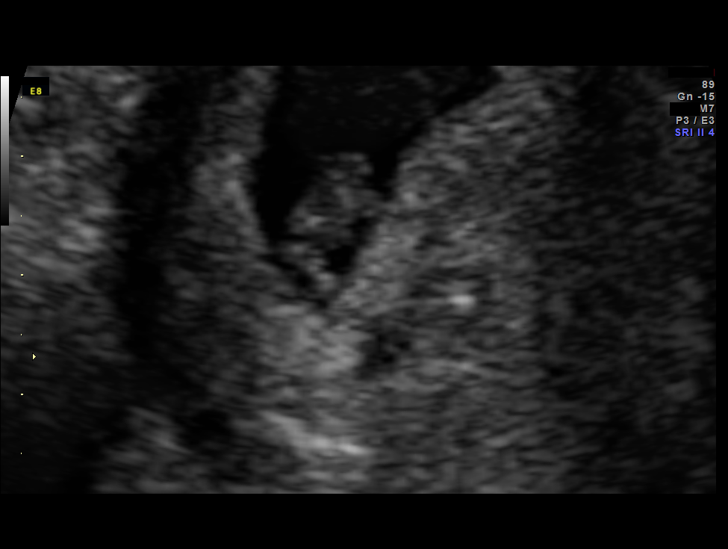

[13 of 19 positions shown; findings below may reference images not displayed]

OBSTETRICS REPORT
                      (Signed Final 08/31/2012 [DATE])

             WIANDA

Service(s) Provided

 US OB COMP LESS 14 WKS                                76801.0
Indications

 Unsure of LMP;  Establish Gestational [AGE]
 Hypothyroid
 Diabetes - Pregestational, Class C (onset ate 10-
 19, with duration of 10-19 yrs)
Fetal Evaluation

 Num Of Fetuses:    1
 Preg. Location:    Intrauterine
 Gest. Sac:         Intrauterine
 [place] Sac:          Visualized
 Fetal Pole:        Visualized
 Fetal Heart Rate:  169                          bpm
 Cardiac Activity:  Observed

 Amniotic Fluid
 AFI FV:      Subjectively within normal limits
Biometry

 CRL:       13  mm     G. Age:  7w 4d                  EDD:    04/15/13
Gestational Age

 LMP:           8w 2d         Date:  07/04/12                 EDD:   04/10/13
 Best:          7w 4d      Det. By:  U/S C R L (08/31/12)     EDD:   04/15/13
Cervix Uterus Adnexa

 Cervix:       Normal appearance by transabdominal scan.
 Left Ovary:    Within normal limits.
 Right Ovary:   Within normal limits.
Impression

 Single IUP at 7 [DATE] weeks by ultrasound today.
 Fetal heart activity noted.
Recommendations

 See separate consult letter.
 Recommend follow up at approx 18 weeks for detailed
 anatomy.
 Fetal echo at 20-22 weeks due to pregestational diabetes.

 BRODERICK LANTIGUA with us.  Please do not hesitate to contact

## 2012-08-31 NOTE — Progress Notes (Signed)
Maternal Fetal Medicine Consultation  Requesting Provider(s): Francoise Ceo, MD  Reason for consultation: hypothyroidism, class B diabetes  HPI: Regina Jones is a 22 yo G2P0010 currently at 8 2/7 weeks (by dates) who is referred for a dating ultrasound and consultation due to pre gestational diabetes and hypothyroidism.  The patient reports that she was first diagnosed with diabetes about 1 year ago - was treated with Metformin 500 mg BID.  No records available for review, but reports that her blood sugars were well-controlled on this regimen.  She has no known end-organ damage; had a normal eye grounds about a year ago, no hypertension or renal impairment to her knowledge.  Additionally, she reports a history of hypothyroidism- currently on 25 mcg of synthroid daily.  She stopped all medications when she found out that she was pregnant.  She has medications available, and after our discussion, will resume her medications again.  The patient reports some "old blood" vaginal discharge earlier during her pregnancy that has since resolved.  She is otherwise without complaints today.  OB History: OB History   Grav Para Term Preterm Abortions TAB SAB Ect Mult Living   2 0 0 0 1 0 1 0 0 0     Early SAB  PMH:  Past Medical History  Diagnosis Date  . Thyroid condition   . Diabetes mellitus without complication     PSH: History reviewed. No pertinent past surgical history.  Meds: Metformin 500 mg BID, Synthroid 25 mcg daily, PNV, folic acid and "nausea medications"  Allergies: NKDA FH: Soc:  Review of Systems: no vaginal bleeding or cramping/contractions, no LOF, no nausea/vomiting. All other systems reviewed and are negative.  PE:   Filed Vitals:   08/31/12 0803  BP: 131/84  Pulse: 82    GEN: well-appearing female ABD: gravid, NT  Ultrasound: Single IUP at 7 4/7 weeks by ultrasound today. Fetal heart activity noted.  A/P: 1) Single IUP at 7 4/7 weeks by ultrasound  today (recommend adjusting dates accordingly)         2) Hypothyroidism - will resume synthroid at previous dose (25 mcg daily).  Will check freeT4 and TSH today.         3) Class B / type 2 diabetes - will resume metformin as previously written.  Will check baseline labs (HbA1C, CMP, 24-hr urine protein and creatinine clearance).  The patient will meet with our diabetic educator and resume fingerstick checks (fasting, 2-hr post prandial values).  Recommend follow up ultrasound and 18 weeks for detailed anatomy (scheduled) and fetal echo at 290-[redacted] weeks gestation.  Will continue serial growth scans thereafter.  Recommend antepartum fetal testing (NSTs) beginning at 32 weeks and induction of labor at 39 weeks due to pre gestational diabetes.           Thank you for the opportunity to be a part of the care of [facility]. Please contact our office if we can be of further assistance.   I spent approximately 30 minutes with this patient with over 50% of time spent in face-to-face counseling.  Alpha Gula, MD Maternal Fetal Medicine

## 2012-09-01 ENCOUNTER — Ambulatory Visit (HOSPITAL_COMMUNITY)
Admission: RE | Admit: 2012-09-01 | Discharge: 2012-09-01 | Disposition: A | Discharge status: Home / Self Care | Payer: Self-pay | Source: Ambulatory Visit | Attending: Obstetrics | Admitting: Obstetrics

## 2012-09-01 DIAGNOSIS — O99280 Endocrine, nutritional and metabolic diseases complicating pregnancy, unspecified trimester: Secondary | ICD-10-CM | POA: Insufficient documentation

## 2012-09-01 DIAGNOSIS — O99810 Abnormal glucose complicating pregnancy: Secondary | ICD-10-CM | POA: Insufficient documentation

## 2012-09-01 DIAGNOSIS — E079 Disorder of thyroid, unspecified: Secondary | ICD-10-CM | POA: Insufficient documentation

## 2012-09-01 DIAGNOSIS — E039 Hypothyroidism, unspecified: Secondary | ICD-10-CM | POA: Insufficient documentation

## 2012-09-01 LAB — PROTEIN, URINE, 24 HOUR
Collection Interval-UPROT: 24 hours
Protein, 24H Urine: 30 mg/d — ABNORMAL LOW (ref 50–100)
Protein, Urine: 3 mg/dL
Urine Total Volume-UPROT: 1000 mL

## 2012-09-01 LAB — CREATININE CLEARANCE, URINE, 24 HOUR: Urine Total Volume-CRCL: 1000 mL

## 2012-09-09 ENCOUNTER — Ambulatory Visit (HOSPITAL_COMMUNITY): Payer: Self-pay

## 2012-09-09 ENCOUNTER — Encounter: Payer: Self-pay | Attending: Obstetrics | Admitting: Dietician

## 2012-09-09 ENCOUNTER — Ambulatory Visit (HOSPITAL_COMMUNITY)
Admission: RE | Admit: 2012-09-09 | Discharge: 2012-09-09 | Disposition: A | Discharge status: Home / Self Care | Payer: Self-pay | Source: Ambulatory Visit | Attending: Obstetrics | Admitting: Obstetrics

## 2012-09-09 DIAGNOSIS — Z713 Dietary counseling and surveillance: Secondary | ICD-10-CM | POA: Insufficient documentation

## 2012-09-09 DIAGNOSIS — E119 Type 2 diabetes mellitus without complications: Secondary | ICD-10-CM | POA: Insufficient documentation

## 2012-09-09 NOTE — ED Notes (Signed)
Spanish speaking lady seen with the assistance of the Spanish interpreter.  She is a G2P0, with a history of spontaneous abortion with the first pregnancy and a history of type 2 diabetes controlled with Metformin.  Has a family history (Mom and Dad) of type 2 diabetes.  Her EDD: 04/10/2013 Ht: 60.75 in  WT: 154 lbs  Meds: Metformin 500 mg daily, Levothyroxine, and Prenatal vitamin daily. Blood Glucose Monitoring:  Has a Reli-On meter and has been trying to monitor 4 times per day.  Unsure of specific times.   Fasting: 74, 86,   PC Breakfast: 141, 120   PC Lunch: 107 124,   PC Dinner: 100, 167.  Reviewed the recommended testing times and blood glucose goals. Reviewed the carbohydrate restricted diet and carb counting. Provided handouts: Nutrition, Diabetes, and Pregnancy; Carbohydrate Counting Guide, Blood Glucose Log and all were written in Spanish. Today at 4:30 about 3 hours after lunch, her glucose was 85 mg/dl. Instructed her to continue to take the Metformin as prescribed and to monitor her glucose levels and have her English speaking friend call in her glucose levels to [facility] Tuesday, [facility]th.  At that time the MD can evaluate the need for Medication changes.  Her pharmacy is th [address] on [address] [place], [place]:[phone]. Currently she has been advised by Dr. Gaynell Face to not walk during the first few months of the pregnancy. I will plan to see her if her blood glucose levels are not being controlled and she is in need of diet or medication review.  Maggie May, RN, RD, CDE

## 2012-10-01 ENCOUNTER — Other Ambulatory Visit (HOSPITAL_COMMUNITY): Payer: Self-pay | Admitting: Obstetrics

## 2012-10-01 ENCOUNTER — Ambulatory Visit (HOSPITAL_COMMUNITY)
Admission: RE | Admit: 2012-10-01 | Discharge: 2012-10-01 | Disposition: A | Discharge status: Home / Self Care | Payer: Self-pay | Source: Ambulatory Visit | Attending: Obstetrics | Admitting: Obstetrics

## 2012-10-01 DIAGNOSIS — O26859 Spotting complicating pregnancy, unspecified trimester: Secondary | ICD-10-CM

## 2012-10-01 DIAGNOSIS — O209 Hemorrhage in early pregnancy, unspecified: Secondary | ICD-10-CM | POA: Insufficient documentation

## 2012-10-01 DIAGNOSIS — E039 Hypothyroidism, unspecified: Secondary | ICD-10-CM | POA: Insufficient documentation

## 2012-10-01 DIAGNOSIS — E079 Disorder of thyroid, unspecified: Secondary | ICD-10-CM | POA: Insufficient documentation

## 2012-10-01 IMAGING — US US OB COMP LESS 14 WK
1 series · 13 of 17 positions shown · non-contrast
Comparison: none

[Series 1: us ob comp less 14 wk · 0.15mm/px · 13 of 17 slices shown]
[im 1/17]
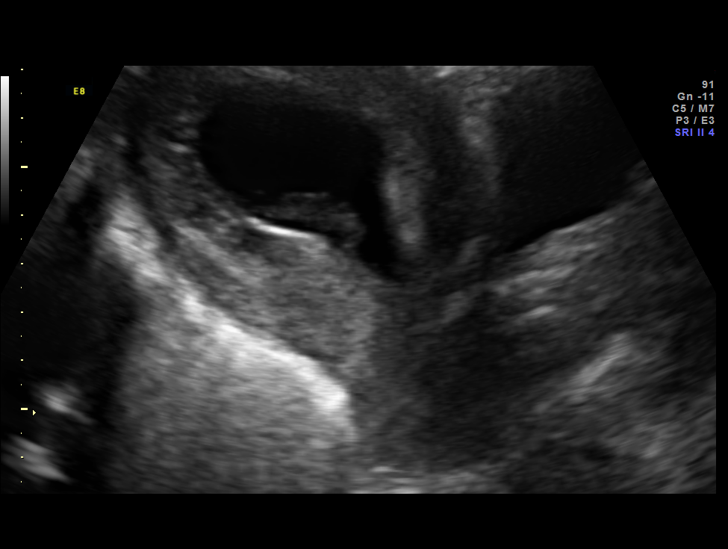
[im 2/17]
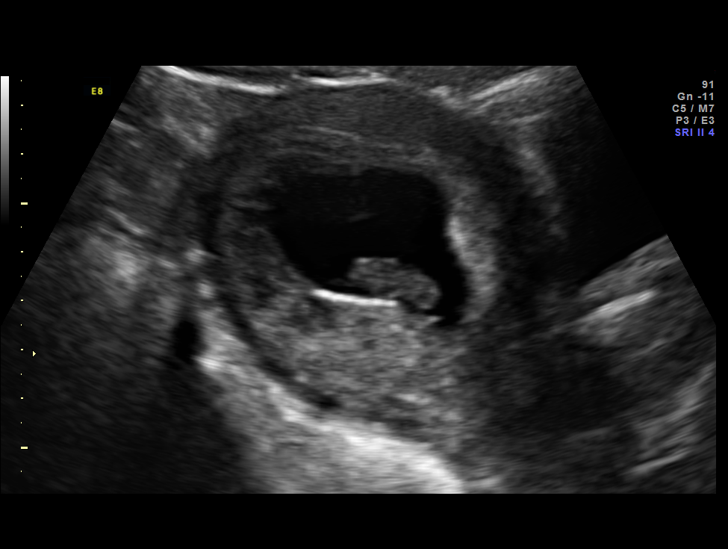
[im 4/17]
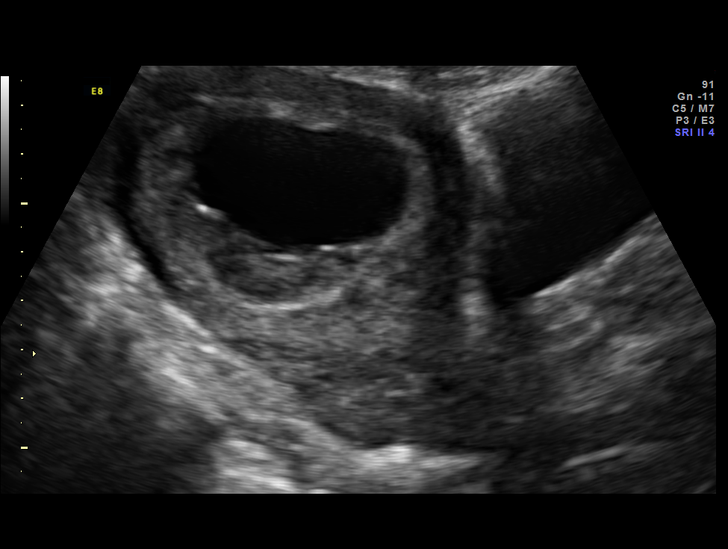
[im 5/17]
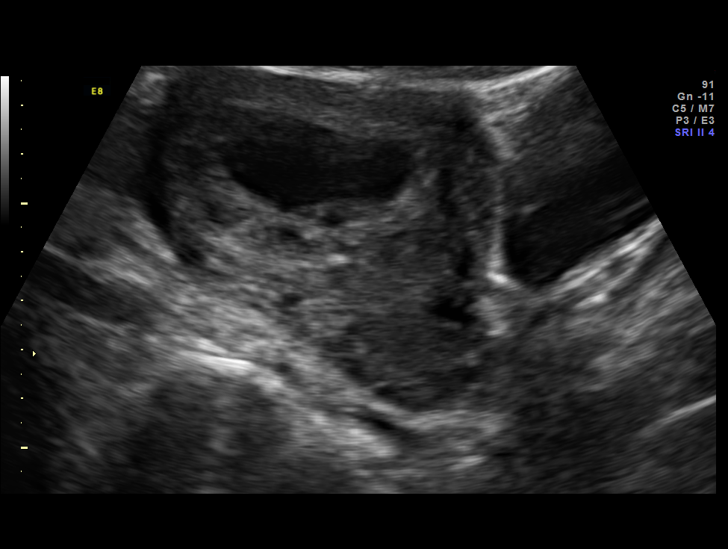
[im 6/17]
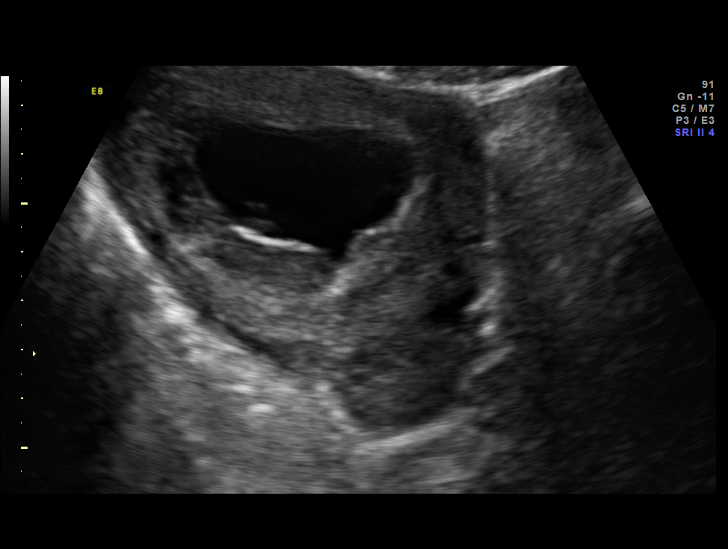
[im 8/17]
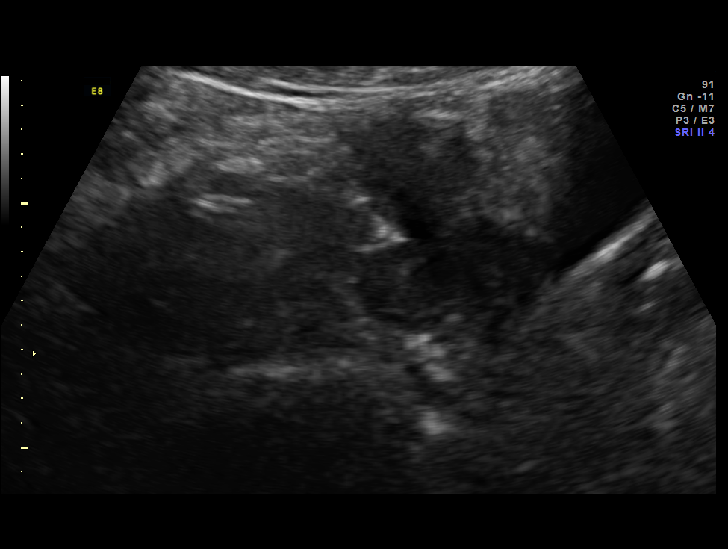
[im 9/17]
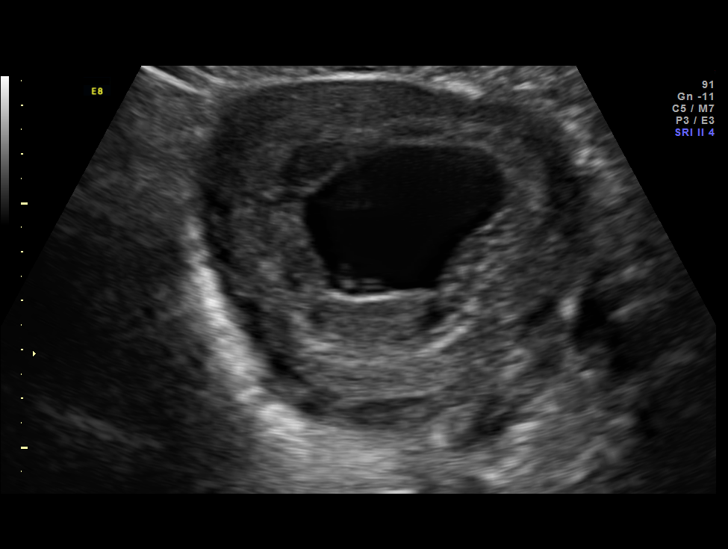
[im 10/17]
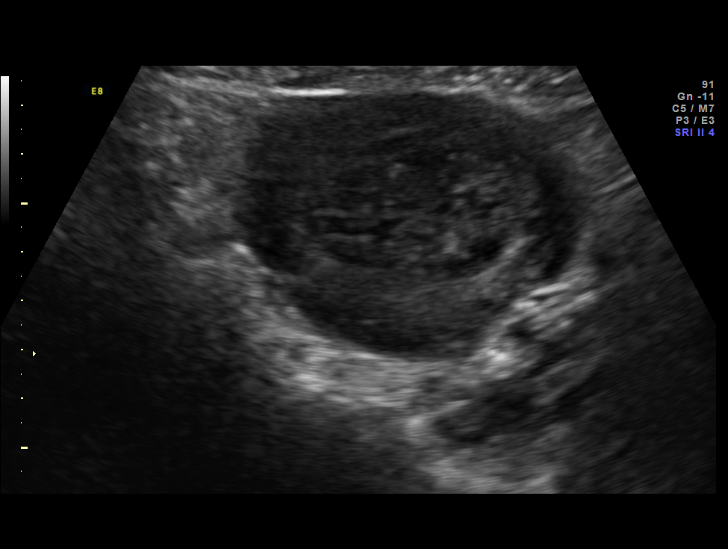
[im 12/17]
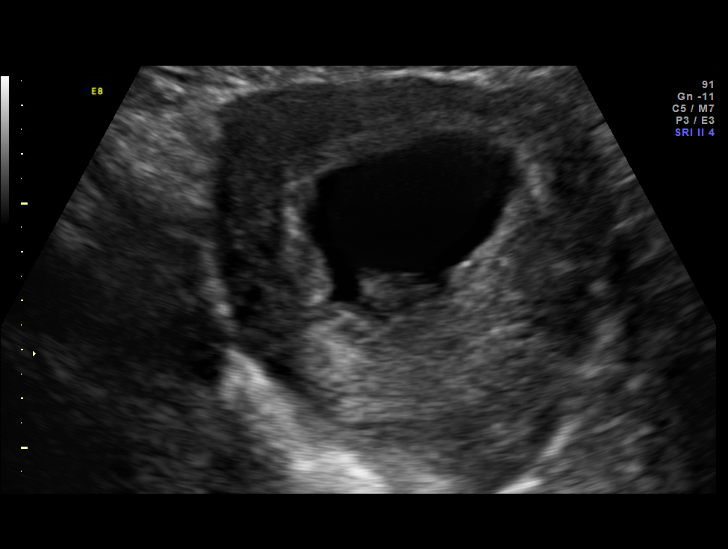
[im 13/17]
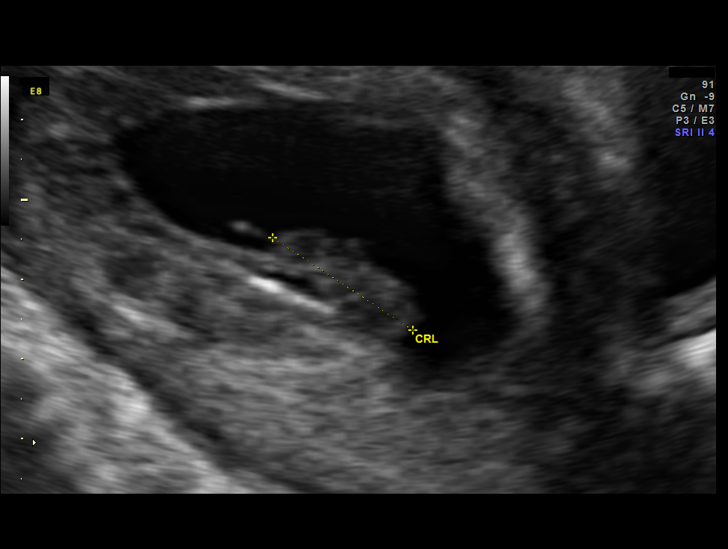
[im 14/17]
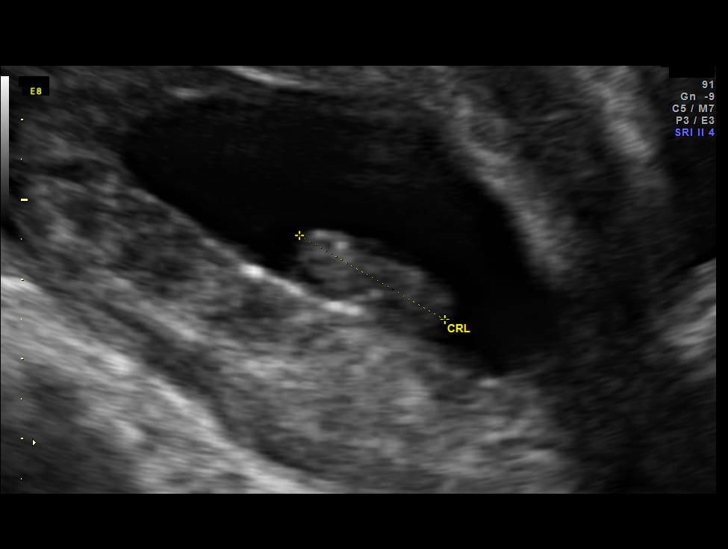
[im 16/17]
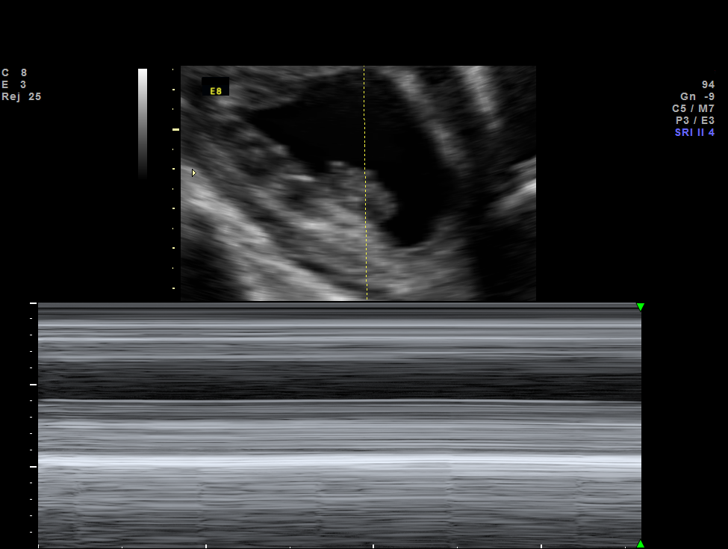
[im 17/17]
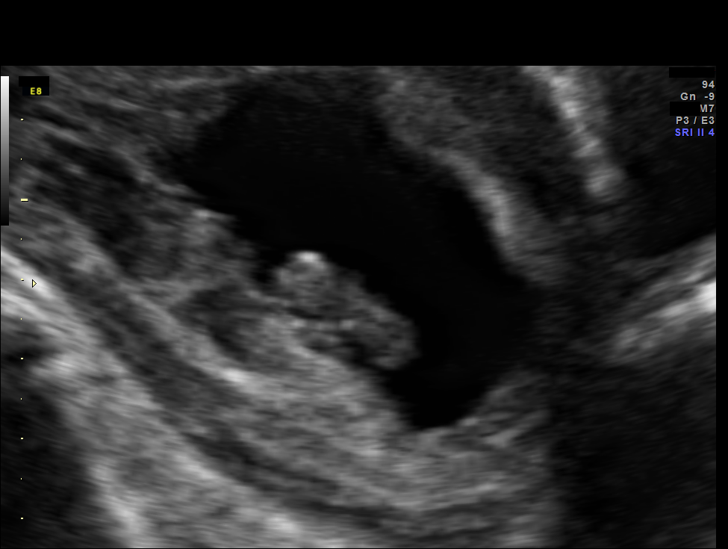

[13 of 17 positions shown; findings below may reference images not displayed]

OBSTETRICS REPORT
                      (Signed Final 10/01/2012 [DATE])

             [facility]

Service(s) Provided

 US OB COMP LESS 14 WKS                                76801.0
Indications

 Hypothyroid
 Vaginal bleeding, unknown etiology
Fetal Evaluation

 Num Of Fetuses:    1
 Preg. Location:    Intrauterine
 Gest. Sac:         Intrauterine
 Cardiac Activity:  Not visualized
Biometry

 CRL:       21  mm     G. Age:  8w 4d                  EDD:    05/09/13
Gestational Age

 LMP:           12w 5d        Date:  07/04/12                 EDD:   04/10/13
 Best:          12w 0d     Det. By:  U/S C R L (08/31/12)     EDD:   04/15/13
Cervix Uterus Adnexa

 Left Ovary:    Within normal limits.
 Right Ovary:   Within normal limits.
Comments

 Ms. Hetal Lundquist was seen due to vaginal bleeding since
 [REDACTED].  Unfortunately, today's ultrasound showed a missed
 abortion with the fetus measuring 8 weeks 4 days.  I
 explained the ultrasound findings with Ms. Hetal Lundquist
 and her husband using a Spanish interpreter.  We also
 discussed her management options including expectant
 management, medical management with misoprostol, and
 surgical management with D&C.  I reviewed the risks and
 benefits of each approach with Ms. Hetal Lundquist and her
 husband.  She elected to proceed with misoprostol.  I
 provided her with a prescription for 800 mcg of misoprostol to
 be placed her vagina once.  Ms. Hetal Lundquist requested to
 follow up in [facility] here in the
 [REDACTED].  I spoke with Dr. Peterson
 from the clinic who agreed to arrange a follow up
 appointment next week.  The clinic will call Ms. Elkier
 Locklear and arrange an appointment next week.  I provided
 Ms. Hetal Lundquist with the phone numbers for the [facility]
 and the [facility] and instructed her to call
 the clinic or come to the hospital if she experiences heavy
 bleeding (soaking through a heavy pad once per hour for two
 consecutive hours), severe abdominal pain, lightheadedness,
 or dizziness.  She will be accompanied by her husband all
 weekend, so will have emergency transportation to the
 hospital in the unlikely event it is needed.

 I spent 20 minutes counseling Ms. Gabriel Real Lebs
 face regarding her management options.
Impression

 Single living intrauterine pregnancy at 12 weeks 0 days.
 Missed abortion.
 See comments.
Recommendations

 Follow-up ultrasounds as clinically indicated.

 HHUMB SALAZAR ORELLANA with us.  Please do not hesitate to contact
                Noella, Alhajiba

## 2012-10-01 NOTE — Progress Notes (Signed)
Ms. Demitra Danley was seen for ultrasound appointment today.  Please see [facility] report for details.

## 2012-10-06 ENCOUNTER — Ambulatory Visit (INDEPENDENT_AMBULATORY_CARE_PROVIDER_SITE_OTHER): Payer: Self-pay | Admitting: Obstetrics & Gynecology

## 2012-10-06 ENCOUNTER — Encounter: Payer: Self-pay | Admitting: Obstetrics & Gynecology

## 2012-10-06 VITALS — BP 122/89 | HR 71 | Temp 98.4°F | Wt 154.0 lb

## 2012-10-06 DIAGNOSIS — O039 Complete or unspecified spontaneous abortion without complication: Secondary | ICD-10-CM

## 2012-10-06 DIAGNOSIS — E119 Type 2 diabetes mellitus without complications: Secondary | ICD-10-CM

## 2012-10-06 MED ORDER — METFORMIN HCL 500 MG PO TABS: 500.0000 mg | ORAL_TABLET | Freq: Two times a day (BID) | ORAL | Status: DC

## 2012-10-06 NOTE — Progress Notes (Signed)
Subjective:     Patient ID: Regina Jones, female   DOB: 1991-06-16, 22 y.o.   MRN: [id number]  HPI Pt is a G2P0020 who was seen 5 days prev my MFM for sono and noted to have a missed AB.  Pt was given a rx for cytotec which she did not take because she was given the option to take it or to allow the tissue to pass on its own.  She had heavier bleeding following the appt and was afraid to take the meds.  She is worried about her 2 SABs.  She recently relocated here from [place] and does not have a primary care physician.  Past Medical History  Diagnosis Date  . Thyroid condition   . Diabetes mellitus without complication   History reviewed. No pertinent past surgical history. Current Outpatient Prescriptions on File Prior to Visit  Medication Sig Dispense Refill  . ibuprofen (ADVIL,MOTRIN) 600 MG tablet Take 1 tablet (600 mg total) by mouth every 6 (six) hours as needed for pain.  30 tablet  1  . levothyroxine (SYNTHROID, LEVOTHROID) 25 MCG tablet Take 25 mcg by mouth daily.      . Prenatal Vit-Fe Fumarate-FA (PRENATAL MULTIVITAMIN) TABS Take 1 tablet by mouth at bedtime.       No current facility-administered medications on file prior to visit.  No Known Allergies History   Social History  . Marital Status: Married    Spouse Name: N/A    Number of Children: N/A  . Years of Education: N/A   Occupational History  . Not on file.   Social History Main Topics  . Smoking status: Never Smoker   . Smokeless tobacco: Not on file  . Alcohol Use: No  . Drug Use: No  . Sexually Active: Yes    Birth Control/ Protection: None     Comment: desires to conceive in6 mos- will use condoms   Other Topics Concern  . Not on file   Social History Narrative  . No narrative on file        Review of Systems     Objective:   Physical Exam BP 122/89  Pulse 71  Temp(Src) 98.4 F (36.9 C)  Wt 154 lb (69.854 kg)  LMP 07/04/2012  Breastfeeding? Unknown Pt in NAD Abd: soft, NT,  ND GYN: not done  sono 10/01/12 missed ab @ 8 6/[redacted] weeks gestation      Assessment:     completed Ab at 8 weeks- pt counseled not to get pregnant within 3 months to decrease the chance of further miscarriage DM- d/w pt importance of control to decrease the chance to SAB        Plan:     Pt to take cytotec F/u in 3 months or sooner prn to discuss recurrent AB F/u with primary care physician Refilled Metformin 500mg  bid

## 2012-10-06 NOTE — Patient Instructions (Addendum)
Aborto espontneo  (Miscarriage) El aborto espontneo es la prdida de un beb que no ha nacido (feto) antes de la semana 20 del embarazo. La mayor parte de estos abortos ocurre en los primeros 3 meses. En algunos casos ocurre antes de que la mujer sepa que est embarazada. Tambin se denomina "aborto espontneo" o "prdida prematura del embarazo". El aborto espontneo puede ser una experiencia que afecte emocionalmente a la persona. Converse con su mdico si tiene dudas, cmo es el proceso de duelo, y sobre planes futuros de embarazo.  CAUSAS   Algunos problemas cromosmicos pueden hacer imposible que el beb se desarrolle normalmente. Los problemas con los genes o cromosomas del beb son generalmente el resultado de errores que se producen, por casualidad, cuando el embrin se divide y crece. Estos problemas no se heredan de los padres.  Infeccin en el cuello del tero.   Problemas hormonales.   Problemas en el cuello del tero, como tener un tero incompetente. Esto ocurre cuando los tejidos no son lo suficientemente fuertes como para contener el embarazo.   Problemas del tero, como un tero con forma anormal, los fibromas o anormalidades congnitas.   Ciertas enfermedades crnicas.   No fume, no beba alcohol, ni consuma drogas.   Traumatismos  A veces, la causa es desconocida.  SNTOMAS   Sangrado o manchado vaginal, con o sin clicos o dolor.  Dolor o clicos en el abdomen o en la cintura.  Eliminacin de lquido, tejidos o cogulos grandes por la vagina. DIAGNSTICO  El mdico le har un examen fsico. Tambin le indicar una ecografa para confirmar el aborto. Es posible que se realicen anlisis de sangre.  TRATAMIENTO   En algunos casos el tratamiento no es necesario, si se eliminan naturalmente todos los tejidos embrionarios que se encontraban en el tero. Si el feto o la placenta quedan dentro del tero (aborto incompleto), pueden infectarse, los tejidos que quedan  pueden infectarse y deben retirarse. Generalmente se realiza un procedimiento de dilatacin y curetaje (D y C). Durante el procedimiento de dilatacin y curetaje, el cuello del tero se abre (dilata) y se retira cualquier resto de tejido fetal o placentario del tero.  Si hay una infeccin, le recetarn antibiticos. Podrn recetarle otros medicamentos para reducir el tamao del tero (contraerlo) si hay una mucho sangrado.  Si su sangre es Rh negativa y su beb es Rh positivo, usted necesitar la inyeccin de inmunoglobulina Rh. Esta inyeccin proteger a los futuros bebs de tener problemas de compatibilidad Rh en futuros embarazos. INSTRUCCIONES PARA EL CUIDADO EN EL HOGAR   El mdico le indicar reposo en cama o le permitir realizar actividades livianas. Vuelva a la actividad lentamente o segn las indicaciones de su mdico.  Pdale a alguien que la ayude con las responsabilidades familiares y del hogar durante este tiempo.   Lleve un registro de la cantidad y la saturacin de las toallas higinicas que utiliza cada da. Anote esta informacin   No use tampones. No No se haga duchas vaginales ni tenga relaciones sexuales hasta que el mdico la autorice.   Slo tome medicamentos de venta libre o recetados para calmar el dolor o el malestar, segn las indicaciones de su mdico.   No tome aspirina. La aspirina puede ocasionar hemorragias.   Concurra puntualmente a las citas de control con el mdico.   Si usted o su pareja tienen dificultades con el duelo, hable con su mdico para buscar la ayuda psicolgica que los ayude a enfrentar la prdida   del embarazo. Permtase el tiempo suficiente de duelo antes de quedar embarazada nuevamente.  SOLICITE ATENCIN MDICA DE INMEDIATO SI:   Siente calambres intensos o dolor en la espalda o en el abdomen.  Tiene fiebre.  Elimina grandes cogulos de sangre (del tamao de una nuez o ms) o tejidos por la vagina. Guarde lo que ha eliminado para  que su mdico lo examine.   La hemorragia aumenta.   Observa una secrecin vaginal espesa y con mal olor.  Se siente mareada, dbil, o se desmaya.   Siente escalofros.  ASEGRESE DE QUE:   Comprende estas instrucciones.  Controlar su enfermedad.  Solicitar ayuda de inmediato si no mejora o si empeora. Document Released: 04/02/2005 Document Revised: 12/23/2011 [employer] Patient Information 2013 [employer].  

## 2012-10-07 ENCOUNTER — Ambulatory Visit (HOSPITAL_COMMUNITY): Payer: Self-pay

## 2012-11-15 ENCOUNTER — Ambulatory Visit (HOSPITAL_COMMUNITY): Payer: Self-pay

## 2014-01-19 ENCOUNTER — Encounter: Payer: Self-pay | Admitting: Obstetrics & Gynecology

## 2014-01-19 ENCOUNTER — Ambulatory Visit (INDEPENDENT_AMBULATORY_CARE_PROVIDER_SITE_OTHER): Payer: Self-pay | Admitting: Obstetrics & Gynecology

## 2014-01-19 VITALS — BP 109/74 | HR 57 | Ht 61.0 in | Wt 151.2 lb

## 2014-01-19 DIAGNOSIS — E039 Hypothyroidism, unspecified: Secondary | ICD-10-CM | POA: Insufficient documentation

## 2014-01-19 DIAGNOSIS — O24919 Unspecified diabetes mellitus in pregnancy, unspecified trimester: Secondary | ICD-10-CM

## 2014-01-19 DIAGNOSIS — O99281 Endocrine, nutritional and metabolic diseases complicating pregnancy, first trimester: Secondary | ICD-10-CM

## 2014-01-19 DIAGNOSIS — E038 Other specified hypothyroidism: Secondary | ICD-10-CM

## 2014-01-19 DIAGNOSIS — Z3480 Encounter for supervision of other normal pregnancy, unspecified trimester: Secondary | ICD-10-CM

## 2014-01-19 DIAGNOSIS — O24311 Unspecified pre-existing diabetes mellitus in pregnancy, first trimester: Secondary | ICD-10-CM

## 2014-01-19 DIAGNOSIS — E119 Type 2 diabetes mellitus without complications: Secondary | ICD-10-CM | POA: Insufficient documentation

## 2014-01-19 DIAGNOSIS — O99280 Endocrine, nutritional and metabolic diseases complicating pregnancy, unspecified trimester: Secondary | ICD-10-CM

## 2014-01-19 DIAGNOSIS — E0590 Thyrotoxicosis, unspecified without thyrotoxic crisis or storm: Secondary | ICD-10-CM

## 2014-01-19 DIAGNOSIS — Z3200 Encounter for pregnancy test, result unknown: Secondary | ICD-10-CM

## 2014-01-19 DIAGNOSIS — E079 Disorder of thyroid, unspecified: Secondary | ICD-10-CM

## 2014-01-19 DIAGNOSIS — O24319 Unspecified pre-existing diabetes mellitus in pregnancy, unspecified trimester: Secondary | ICD-10-CM | POA: Insufficient documentation

## 2014-01-19 MED ORDER — METFORMIN HCL 500 MG PO TABS: 750.0000 mg | ORAL_TABLET | Freq: Two times a day (BID) | ORAL | Status: DC

## 2014-01-19 NOTE — Patient Instructions (Signed)
Cuidados prenatales  (Prenatal Care ) QU SON LOS CUIDADOS PRENATALES?  Los cuidados prenatales se relacionan con la atencin de la salud durante el embarazo, antes de que nazca el beb. Durante el embarazo, es muy importante que haga lo siguiente para cuidar de usted y de su beb:   Recibir cuidados prenatales de forma temprana. Si sabe que est embarazada, o cree que podra estarlo, llame a su mdico lo antes posible. Programe una visita para un examen prenatal.  Recibir cuidados prenatales con regularidad. Cumpla con el esquema de anlisis de sangre y otros estudios necesarios que le indique su mdico. No falte a las citas.  Hacer todo lo posible para que usted y su beb estn sanos durante el embarazo.  Recibir todos los cuidados necesarios. Los cuidados prenatales deben incluir la evaluacin de las necesidades mdicas, nutricionales, educativas, psicolgicas y sociales de la embarazada y su pareja. Debe hablar con su mdico sobre la historia clnica y gentica de su familia y de la familia del padre del beb.  Informar a su mdico sobre lo siguiente:  Los medicamentos recetados, de venta libre y a base de hierbas que toma.  Cualquier antecedente de abuso de sustancias, consumo de alcohol, hbito de fumar y uso de drogas ilegales.  Cualquier antecedente de violencia domstica o de otro tipo.  Las vacunas que ha recibido.  Su nutricin y su dieta.  La cantidad de ejercicio que hace.  Cualquier peligro ambiental y ocupacional al que est expuesta.  Antecedentes de infecciones de transmisin sexual, tanto suyos como de su pareja.  Embarazos previos. POR QU SON TAN IMPORTANTES LOS CUIDADOS PRENATALES?  Al visitar con regularidad a su mdico, usted ayuda a garantizar que los problemas se identifiquen de forma temprana, para poder tratarlos lo antes posible. Tambin podran prevenirse otros problemas. Muchos estudios han demostrado que los cuidados prenatales brindados de forma  temprana y con regularidad son importantes para la salud de las madres y los bebs.  CMO PUEDO CUIDARME DURANTE EL EMBARAZO?  Puede cuidar de usted y de su beb de los siguientes modos:   Comience a tomar un multivitamnico con 400microgramos (mcg) de cido flico todos los das, o siga tomndolo.  Reciba cuidados prenatales de forma temprana y con regularidad. Es muy importante que vea a un mdico durante el embarazo. Su mdico la examinar en cada visita para asegurarse de que usted y el beb estn sanos. Si hay algn problema, se puede actuar de inmediato para ayudarlos a usted y al beb.  Consuma una dieta saludable que incluya:  Frutas.  Verduras.  Alimentos con bajo contenido de grasas saturadas.  Cereales integrales.  Alimentos con alto contenido de calcio, como leche, yogur y quesos duros.  Beba entre 6 y 8 vasos de lquidos por da.  A menos que su mdico le indique otra cosa, intente hacer actividad fsica durante 30minutos, todos los das. Si no le alcanza el tiempo, puede hacer actividad fsica en perodos de 10minutos, tres veces al da.  No fume, no beba alcohol ni consuma drogas. Estos pueden causarle daos a largo plazo al beb. Hable con su mdico sobre los pasos a seguir para dejar de fumar. Hable con un miembro de su comunidad religiosa, un orientador, un amigo de confianza o con su mdico en el caso de que consuma alcohol o drogas y est preocupada al respecto.  Pregntele a su mdico antes de tomar cualquier medicamento, incluso los de venta libre. No es seguro tomar algunos medicamentos durante el embarazo.    Descanse y duerma lo suficiente.  Evite jacuzzis y saunas durante el embarazo.  No se haga radiografas, a menos que sean absolutamente necesarias y que se las indique su mdico. Pueden colocarle una pantalla de plomo sobre el abdomen para proteger al beb cuando le hagan radiografas en otras partes del cuerpo.  No limpie la arena higinica para gatos  durante el embarazo. Puede contener un parsito que causa una infeccin llamada toxoplasmosis, que puede provocar defectos congnitos. Adems, use guantes al trabajar en reas del jardn que usen los gatos.  No coma carne de vaca, pollo o pescado que est cruda o poco cocida.  No coma quesos madurados con hongos (brie, camembert y de cabra) ni quesos azules blandos (dans y roquefort).  Aljese de sustancias qumicas txicas como las siguientes:  Insecticidas.  Solventes (algunos agentes de limpieza o diluyentes de pintura).  Plomo.  Mercurio.  Puede tener relaciones sexuales hasta el final del embarazo, excepto si tiene algn problema mdico o alguna dificultad en el embarazo y su mdico le indica que no las tenga.  No use tacones altos, especialmente durante la segunda mitad del embarazo. Puede perder el equilibrio y caerse.  No haga viajes largos, a menos que sean absolutamente necesarios. Asegrese de visitar al mdico antes de hacer el viaje.  No permanezca sentada en una posicin durante ms de 2 horas cuando viaje.  Lleve una copia de su historia clnica cuando viaje. Averige adnde hay un hospital en la ciudad que visitar, en caso de emergencia.  La mayora de los productos de limpieza tienen advertencias sobre el embarazo en la etiqueta. Si no est segura, consulte a su mdico acerca de los productos.  Limite o elimine la cafena de su dieta, evite el caf, t, bebidas gaseosas, medicamentos y chocolate.  Muchas mujeres siguen trabajando durante el embarazo. Estar activa puede ayudarla a estar ms sana. Si tiene dudas sobre la seguridad de un trabajo especfico o las horas que debera trabajar, hable con su mdico.  Infrmese:  Lea libros.  Mire videos.  Vaya a las clases de preparto con su pareja.  Hable con mams experimentadas.  Pregntele a su mdico sobre las clases preparto para usted y su pareja. Las clases la ayudarn a usted y a su compaero a prepararse  para el nacimiento de su beb.  Busque un mdico de bebs (pediatra) y consulte acerca de los mtodos y los medicamentos para aliviar el dolor durante el trabajo de parto, el parto y en el caso de que se le haga una cesrea. CON QU FRECUENCIA DEBO VISITAR A MI MDICO DURANTE EL EMBARAZO?  Su mdico le dar un esquema con las visitas prenatales. Las visitas sern ms frecuentes a medida que se acerque el final del embarazo. Un embarazo promedio dura aproximadamente de 40semanas.  Un esquema habitual incluye visitar al mdico con la siguiente frecuencia:   Aproximadamente una vez al mes durante los primeros 6meses de embarazo.  Cada 2semanas durante los prximos 2meses.  Una vez por semana durante el ltimo mes, hasta la fecha de parto. Es probable que su mdico quiera verla ms a menudo en los siguientes casos:  Tiene ms de 35aos.  Su embarazo es de alto riesgo debido a que usted tiene ciertos problemas de salud o con el embarazo, por ejemplo:  Diabetes.  Hipertensin arterial.  El beb no se desarrolla como debera, segn las fechas del embarazo. Su mdico le har estudios especiales para asegurarse de que usted y el beb no tengan   problemas graves. QU SUCEDE DURANTE LAS VISITAS PRENATALES?   En la primera visita prenatal, su mdico le har un examen fsico y hablar con usted sobre sus antecedentes mdicos, los de su pareja y su familia. Su mdico podr decirle la fecha probable de parto.  Su primer examen fsico incluir controles de la presin arterial, el peso y la altura, y un examen de los rganos de la pelvis. Su mdico le har un Papan[place]ola[place] si no le han hecho uno recientemente y har cultivos del cuello del tero para descartar infecciones.  En cada visita prenatal, le harn anlisis de [place] y [place], Cytogeneticist la presin arterial, le controlarn el peso y verificarn el desarrollo del beb.  En sus ltimas visitas prenatales, su mdico examinar su estado y el  desarrollo del beb. Es posible que le hagan varios estudios a medida que avance el [place].  A menudo, se hacen ecografas para verificar el crecimiento y la salud del beb.  Puede ser que le hagan ms anlisis de sangre y [place], [place] de estudios especiales, segn sea necesario. Estos pueden incluir una amniocentesis para examinar el lquido del saco amnitico, pruebas de estrs para ver cmo responde el beb a las contracciones o un perfil biofsico para Teacher, music del beb. Su mdico le [employer] estudios y la necesidad de cada uno.  Entre la semana 24 y 28 de Psychiatrist, Chief Executive Officer harn un anlisis para saber si tiene un nivel alto de azcar en la sangre (diabetes gestacional).  Debe hablar con el mdico sobre sus planes de amamantar o darle el bibern a su beb.  En cada visita, tambin tendr la posibilidad de aprender a mantenerse saludable durante el embarazo y de hacer preguntas. Document Released: 12/10/2007 Document Revised: 06/28/2013 [facility] Patient Information 2015 [place], [place]. This information is not intended to replace advice given to you by your health care provider. Make sure you discuss any questions you have with your health care provider.   Embarazo - Primer trimestre  (Pregnancy - First Trimester)  Durante el acto sexual, millones de espermatozoides entran en la vagina. Slo 1 espermatozoide penetra y fertiliza al vulo mientras se encuentra en la trompa de [place]. Una semana ms tarde, el vulo fertilizado se implanta en la pared del tero. Un embrin comienza a desarrollarse para ser un beb. A las 6 a 8 semanas se forman los ojos y la cara y los latidos del corazn se pueden ver en la ecografa. Al final de las 12 semanas (primer trimestre) todos los rganos del beb estn formados. Ahora que est embarazada, querr hacer todo lo que est a su alcance para tener un beb sano. Dos de las cosas ms importantes son: Winferd Humphrey buena atencin prenatal y seguir las  indicaciones del profesional que la asiste. La atencin prenatal incluye toda la asistencia mdica que usted recibe antes del nacimiento del beb. Se lleva a cabo para prevenir y tratar problemas durante el [address] y [place]. EXAMENES PRENATALES   Durante las visitas prenatales se [employer], la presin arterial y se solicitan anlisis de [place]. Esto se hace para asegurarse de que usted est sana y el embarazo progrese normalmente.  Una mujer embarazada debe aumentar de 25 a 35 libras durante el embarazo. Sin embargo, si usted tiene sobrepeso o Cam[place], su mdico Administrator, Civil Service.  El podr hacerle preguntas y responder todas las que usted le haga.  Durante los exmenes prenatales se solicitan anlisis de [place], cultivos del [place],  un [place] y otros anlisis necesarios. Estas pruebas se realizan para controlar su salud y la del beb. Se recomienda que se haga la prueba para el diagnstico del VIH, con su autorizacin. Este es el virus que causa el [place]. Estas pruebas se realizan porque existen medicamentos que podran administrarle para prevenir que el beb nazca con esta infeccin, si usted estuviera infectada y no lo supiera. Los [employer] Corporation de sangre tambin se Radiographer, therapeutic para Warehouse manager tipo de [place], si tuvo infecciones previas y Chief Operating Officer sus niveles en la sangre (hemoglobina).  Tener un recuento bajo de hemoglobina (anemia) es comn durante Firefighter. Para prevenirla, se administran hierro y vitaminas. En una etapa ms avanzada del [place], le indicarn exmenes de sangre para saber si tiene diabetes, junto con otros anlisis, en caso de que [place].  Es necesario que se haga las pruebas para asegurarse de que usted y el beb estn bien. CAMBIOS DURANTE EL PRIMER TRIMESTRE  Su organismo atravesar numerosos cambios durante el [place]. Estos pueden variar de Neomia Dear persona a otra. Converse con el mdico acerca los cambios que usted nota y que la preocupan. Ellos  son:   El perodo menstrual se detiene.  El vulo y los espermatozoides llevan los genes que determinan cmo seremos. Sus genes y los de su pareja forman el beb. Los genes del varn determinan si ser un nio o una nia.  La circunferencia de la cintura va a ir aumentando y podr sentirse hinchada.  Puede tener Programme researcher, broadcasting/film/video (nuseas) y vmitos. Si no puede controlar los vmitos, consulte a su mdico.  Sus mamas comenzarn a agrandarse y sensibilizarse.  Los pezones pueden sobresalir ms y ser ms oscuros.  Tendr necesidad de orinar ms. El dolor al orinar puede significar que usted tiene una infeccin de la vejiga.  Se cansar con facilidad.  Prdida del apetito.  Sentir un fuerte deseo de consumir ciertos alimentos.  Al principio, usted puede ganar o perder un par de kilos.  Podr tener cambios emocionales de un da a otro (entusiasmo por estar embarazada o preocupacin por Firefighter y el beb).  Tendr sueos ms vvidos y extraos. INSTRUCCIONES PARA EL CUIDADO EN EL HOGAR   Es muy importante evitar el cigarrillo, el alcohol y los frmacos no recetados Academic librarian. Estas sustancias afectan la formacin y el desarrollo del beb. Evite los productos qumicos durante el embarazo para Games developer salud del beb.  Comience las consultas prenatales alrededor de la 12 semana de [place]. Generalmente se programan cada mes al principio y se hacen ms frecuentes en los 2 ltimos meses antes del parto. Cumpla con las citas de control. Siga las indicaciones del mdico con respecto al uso de [place], los anlisis y pruebas de [place], los ejercicios y Psychologist, forensic.  Durante el embarazo debe obtener nutrientes para usted y para su beb. Consuma alimentos balanceados. Elija alimentos como carne, pescado, [place] y otros productos lcteos descremados, vegetales, frutas, panes integrales y cereales. El [employer] informar cul es el aumento de peso ideal.  Las nuseas  matinales pueden aliviarse si come algunas galletitas saladas en la cama. Coma dos galletitas antes de levantarse por la maana. Tambin puede comer galletitas sin sal.  Hacer 4 o 5 comidas pequeas en lugar de 3 comidas grandes por da tambin puede [employer] nuseas y los vmitos.  Beber lquidos [place] comidas en lugar de tomarlos durante las comidas tambin puede ayudar a Optician, dispensing las nuseas y los vmitos.  Puede continuar teniendo  relaciones sexuales durante todo el embarazo si no hay otros problemas. Los problemas pueden ser una prdida precoz (prematura) de lquido amnitico, sangrado vaginal, o dolor en el vientre (abdominal).  Realice Tes[employer] Corporation, si no tiene restricciones. Consulte con su mdico o terapeuta fsico si no est segura de algunos de sus ejercicios. El mayor aumento de peso se producir en los ltimos 2 trimestres del Psychiatrist. El ejercicio le ayudar a:  Engineering geologist.  Mantenerse en forma.  Prepararse para el parto.  La ayudar a perder el peso del embarazo despus de que nazca su beb.  Use un buen sostn o como los que se usan para hacer deportes para Paramedic la sensibilidad de las [place]. Tambin puede serle til si lo [place] mientras duerme.  Consulte cuando puede comenzar con las clases de pre parto. Comience con las clases cuando estn disponibles.  No utilice la baera con agua caliente, baos turcos y saunas.   Colquese el cinturn de seguridad cuando conduzca. Este la proteger a usted y al beb en caso de accidente.  Evite comer carne cruda y el contacto con los utensilios y desperdicios de los gatos. Estos elementos contienen grmenes que pueden causar defectos de nacimiento en el beb.  El primer trimestre es un buen momento para visitar a su dentista y Software engineer. Es importante mantener los dientes limpios. Use un cepillo de dientes suave y cepllese con ms suavidad durante el [employer].  Pida ayuda si tienen  necesidades financieras, teraputicas o nutricionales. El profesional podr ayudarla con respecto a estas necesidades, o derivarla a otros especialistas.  No tome medicamentos o hierbas excepto aquellos que Fish farm manager.  Informe a su mdico si sufre violencia familiar mental o fsica.  Haga una lista de nmeros de telfono de Associate Professor de la familia, los amigos, el hospital y los departamentos de polica y bomberos.  Escriba sus preguntas. Llvelas cuando concurra a su visita prenatal.  No se haga duchas vaginales.  No cruce las piernas.  Si usted tiene que estar parada por largos perodos de [place], gire los pies o de pequeos pasos en crculo.  Es posible que tenga ms secreciones vaginales que puedan requerir una toalla higinica. No use tampones o toallas higinicas perfumadas. EL CONSUMO DE MEDICAMENTOS Y FRMACOS DURANTE EL EMBARAZO   Tome las vitaminas para la etapa prenatal tal como se le indic. Las vitaminas deben contener un miligramo de cido flico. Guarde todas las vitaminas fuera del alcance de los nios. La ingestin de slo un par de vitaminas o tabletas que contengan hierro pueden ocasionar la [employer] en un beb o en un nio pequeo.  Evite el uso de [employer] Mutual [employer] Omaha, incluyendo hierbas, medicamentos de [place], sin receta o que no hayan sido sugeridos por su mdico. Slo tome medicamentos de venta libre o medicamentos recetados para Chief Technology Officer, Environmental health practitioner o fiebre como lo indique su mdico. No tome aspirina, ibuprofeno o naproxeno excepto que su mdico se lo indique.  Infrmele al profesional si consume medicamentos de hierbas.  El alcohol se relaciona con ciertos defectos congnitos. Incluye el sndrome de alcoholismo fetal. Debe evitar absolutamente el consumo de alcohol, en cualquier forma. El fumar causar baja tasa de natalidad y bebs prematuros.  Las drogas ilegales o de la calle son muy perjudiciales para el beb. Estn absolutamente  prohibidas. Un beb que nace de [employer], ser adicto al nacer. Ese beb tendr los mismos sntomas de abstinencia que un  adulto.  Informe a su mdico acerca de los medicamentos que ha tomado y el motivo por el que los tom. SOLICITE ATENCIN MDICA SI:  Tiene preguntas o preocupaciones relacionadas con el embarazo. Es mejor que llame para formular las preguntas si no puede esperar hasta la prxima visita, que sentirse preocupada por ellas.  SOLICITE ATENCIN MDICA DE INMEDIATO SI:   La temperatura oral le sube a ms de 38,9 C (102 F) o lo que su mdico le indique.  Tiene una prdida de lquido por la vagina (canal de parto). Si sospecha una ruptura de las Lake Morton-Berrydalemembranas, tmese la temperatura y llame al profesional para informarlo sobre esto.  Observa unas pequeas manchas o una hemorragia vaginal. Notifique al profesional acerca de la cantidad y de cuntos apsitos est utilizando.  Presenta un olor desagradable en la secrecin vaginal y observa un cambio en el color.  Contina con las nuseas y no obtiene alivio de los remedios indicados. Vomita sangre o algo similar a la borra del caf.  Pierde ms de 2 libras (1 Kg) en una semana.  Aumenta ms de 1 Kg en una semana y [address] [place] el rostro, las manos, los pies o las piernas hinchados.  Aumenta ms de 2,5 Kg en una semana (aunque no tenga las manos, pies, piernas o el rostro hinchados).  Ha estado expuesta a la rubola y no ha sufrido la enfermedad.  Ha estado expuesta a la quinta enfermedad o a la varicela.  Siente dolor en el vientre (abdominal). Las Federal-Mogulmolestias en el ligamento redondo son Neomia Dearuna causa benigna frecuente de dolor abdominal durante el embarazo. El profesional que la asiste deber evaluarla.  Presenta dolor de Renne Muscacabeza, fiebre, diarrea, dolor al orinar o le falta la respiracin.  Se cae, se ve involucrada en un accidente automovilstico o sufre algn tipo de traumatismo.  En su hogar hay violencia mental o  fsica. Document Released: 04/02/2005 Document Revised: 03/17/2012 [facility] Patient Information 2015 [place], [place]. This information is not intended to replace advice given to you by your health care provider. Make sure you discuss any questions you have with your health care provider.

## 2014-01-19 NOTE — Progress Notes (Signed)
Subjective:    [facility] a 23 y.o.[id number] being seen today for her first obstetrical visit.  Patient is Spanish-speaking only, Spanish interpreter present for this encounter.  Her obstetrical history is significant for Type II DM and hypothyroidism; also early SAB x 2. Last HgA1C was 6.3 on 08/31/12; normal TSH at that time. She checks her blood sugars but will run out of supplies soon. Did not bring meter or log book but reports that fastings are in 100-130s, postprandials are less than 140s. On Metformin 500 mg bid and Synthroid 25 mcg qd. Patient does intend to breast feed. Pregnancy history fully reviewed.  Patient reports no complaints.  Filed Vitals:   01/19/14 0942 01/19/14 0944  BP: 109/74   Pulse: 57   Height:  5\' 1"  (1.549 m)  Weight: 151 lb 3.2 oz (68.584 kg)     HISTORY: OB History  Gravida Para Term Preterm AB SAB TAB Ectopic Multiple Living  3 0 0 0 2 2 0 0 0 0     # Outcome Date GA Lbr Len/2nd Weight Sex Delivery Anes PTL Lv  3 CUR           2 SAB              Comments: System Generated. Please review and update pregnancy details.  1 SAB              Past Medical History  Diagnosis Date  . Hypothyroidism   . Diabetes mellitus without complication    History reviewed. No pertinent past surgical history. Family History  Problem Relation Age of Onset  . Diabetes Mother   . Asthma Mother   . Diabetes Father      Exam    Uterus:     Pelvic Exam: Deferred. Normal pap in 06/2013.   System: Breast:  normal appearance, no masses or tenderness   Skin: normal coloration and turgor, no rashes   Neurologic: oriented, normal   Extremities: normal strength, tone, and muscle mass   HEENT PERRLA and extra ocular movement intact   Mouth/Teeth mucous membranes moist, pharynx normal without lesions and dental hygiene good   Neck supple and no masses   Cardiovascular: regular rate and rhythm   Respiratory:  appears well, vitals normal, no  respiratory distress, acyanotic, normal RR, chest clear, no wheezing, crepitations, rhonchi, normal symmetric air entry   Abdomen: soft, non-tender; bowel sounds normal; no masses,  no organomegaly   Clinic scan: 5 week IUGS, no YS, no fetal pole  Assessment:    Pregnancy: [id number] Patient Active Problem List   Diagnosis Date Noted  . Preexisting diabetes complicating pregnancy, antepartum 01/19/2014  . Hypothyroidism affecting pregnancy, antepartum 01/19/2014  . Diabetes mellitus without complication   . Hypothyroidism   Probable intrauterine pregnancy ~ 5 weeks     Plan:   [facility] Will get DM testing supplies there. Return in 4 weeks for viability check, OB visit Discussed implications of DM in pregnancy, need for optimizing glycemic control to decrease DM associated maternal -fetal morbidity and mortality. Metformin increased to 750 mg bid for now, reevaluate at next visit. Discussed the need for items below for DM in pregnancy. [ ]  Baseline labs [ ]  Baby ASA prescribed at 13 weeks [ ]  Fetal ECHO [ ]  Optho exam [ ]  Serial growth scans 20-24-28-32-35-38 [ ]  Antenatal testing starting at 32 weeks [ ]  Delivery by 39 weeks or earlier  if needed At next visit, once viability is confirmed, will check prenatal labs and baseline DM labs and TSH Continue prenatal vitamins. Problem list reviewed and updated. Genetic Screening discussed First Screen, Integrated Screen and Quad Screen: undecided. Ultrasound discussed; fetal survey: to be ordered later. Follow up in 4 weeks.  Routine obstetric precautions reviewed.     Jaynie Collins, MD, FACOG Attending Obstetrician & Gynecologist Center for [employer], [facility]

## 2014-01-19 NOTE — Progress Notes (Signed)
Informal ultrasound shows gestational sac of 5wks 2 days. Fetal pole was seen but not large enough to take an accurate measurement.  Not able to detect fetal heart rate on ultrasound.  Patient is reassured that she is early and will have a return visit to determine viability and dating in approximately two to three weeks.  Patient had normal pap smear done in December [place] [address] [facility] [address]

## 2014-01-30 ENCOUNTER — Ambulatory Visit (INDEPENDENT_AMBULATORY_CARE_PROVIDER_SITE_OTHER): Payer: Self-pay | Admitting: Family Medicine

## 2014-01-30 ENCOUNTER — Encounter: Payer: Self-pay | Attending: Obstetrics & Gynecology | Admitting: *Deleted

## 2014-01-30 DIAGNOSIS — O24919 Unspecified diabetes mellitus in pregnancy, unspecified trimester: Secondary | ICD-10-CM | POA: Insufficient documentation

## 2014-01-30 DIAGNOSIS — E119 Type 2 diabetes mellitus without complications: Secondary | ICD-10-CM | POA: Insufficient documentation

## 2014-01-30 DIAGNOSIS — Z713 Dietary counseling and surveillance: Secondary | ICD-10-CM | POA: Insufficient documentation

## 2014-01-30 DIAGNOSIS — O24311 Unspecified pre-existing diabetes mellitus in pregnancy, first trimester: Secondary | ICD-10-CM

## 2014-01-30 MED ORDER — METFORMIN HCL 500 MG PO TABS: 1000.0000 mg | ORAL_TABLET | Freq: Two times a day (BID) | ORAL | Status: DC

## 2014-01-30 MED ORDER — GLYBURIDE 2.5 MG PO TABS: ORAL_TABLET | ORAL | Status: DC

## 2014-01-30 NOTE — Progress Notes (Signed)
  Patient was seen on 01/30/14 for Diabetes self-management complicated by pregnancy. Regina Jones has been testing her glucose with ReliOn meter she purchased herself. She has run out of supplies. At last visit patient Metformin was increased from Metformin 500BID to Metformin 7.5BID. In review of her glucose readings her FBS range 89-154m/dl and 2hpp range 110-1613mdl. Per Dr. AnHarolyn Rutherforde will increase Metformin to 100037mD and add Glyburide 1.69m75mth dinner. The following learning objectives were met by the patient :   States why dietary management is important in controlling blood glucose  Describes the effects of carbohydrates on blood glucose levels  Demonstrates ability to create a balanced meal plan  Demonstrates carbohydrate counting   States when to check blood glucose levels  States the effect of stress and exercise on blood glucose levels  States the importance of limiting caffeine and abstaining from alcohol and smoking  Plan:  Aim for 2 Carb Choices per meal (30 grams) +/- 1 either way for breakfast Aim for 3 Carb Choices per meal (45 grams) +/- 1 either way from lunch and dinner Aim for 1-2 Carbs per snack Begin reading food labels for Total Carbohydrate and sugar grams of foods Consider  increasing your activity level by walking daily as tolerated Begin checking BG before breakfast and 2 hours after first bit of breakfast, lunch and dinner after  as directed by MD  Take medication  as directed by MD  Blood glucose monitor given: TrueTest, lancets and strips Lot # [id number]: 2016/03/26 Blood glucose reading: FBS this AM 93mg69mper patient  Patient instructed to monitor glucose levels: FBS: 60 - <90 2 hour: <120  Patient received the following handouts:  Nutrition Diabetes and Pregnancy  Carbohydrate Counting List  Meal Planning worksheet  [facility] then will transfer [facility] to [facility]

## 2014-02-15 ENCOUNTER — Ambulatory Visit (INDEPENDENT_AMBULATORY_CARE_PROVIDER_SITE_OTHER): Payer: Self-pay | Admitting: Obstetrics & Gynecology

## 2014-02-15 ENCOUNTER — Encounter: Payer: Self-pay | Admitting: Obstetrics & Gynecology

## 2014-02-15 VITALS — BP 136/87 | HR 68 | Wt 145.0 lb

## 2014-02-15 DIAGNOSIS — E079 Disorder of thyroid, unspecified: Secondary | ICD-10-CM

## 2014-02-15 DIAGNOSIS — O24311 Unspecified pre-existing diabetes mellitus in pregnancy, first trimester: Secondary | ICD-10-CM

## 2014-02-15 DIAGNOSIS — E119 Type 2 diabetes mellitus without complications: Secondary | ICD-10-CM

## 2014-02-15 DIAGNOSIS — O99280 Endocrine, nutritional and metabolic diseases complicating pregnancy, unspecified trimester: Secondary | ICD-10-CM

## 2014-02-15 DIAGNOSIS — O99281 Endocrine, nutritional and metabolic diseases complicating pregnancy, first trimester: Secondary | ICD-10-CM

## 2014-02-15 DIAGNOSIS — O24919 Unspecified diabetes mellitus in pregnancy, unspecified trimester: Secondary | ICD-10-CM

## 2014-02-15 DIAGNOSIS — E0590 Thyrotoxicosis, unspecified without thyrotoxic crisis or storm: Secondary | ICD-10-CM

## 2014-02-15 DIAGNOSIS — Z3480 Encounter for supervision of other normal pregnancy, unspecified trimester: Secondary | ICD-10-CM

## 2014-02-15 DIAGNOSIS — E039 Hypothyroidism, unspecified: Secondary | ICD-10-CM

## 2014-02-15 LAB — HEMOGLOBIN A1C
Hgb A1c MFr Bld: 5.9 % — ABNORMAL HIGH (ref <5.7)
Mean Plasma Glucose: 123 mg/dL — ABNORMAL HIGH (ref <117)

## 2014-02-15 LAB — COMPREHENSIVE METABOLIC PANEL
ALBUMIN: 4.8 g/dL (ref 3.5–5.2)
ALK PHOS: 48 U/L (ref 39–117)
ALT: 31 U/L (ref 0–35)
AST: 17 U/L (ref 0–37)
BUN: 9 mg/dL (ref 6–23)
CALCIUM: 9.4 mg/dL (ref 8.4–10.5)
CHLORIDE: 100 meq/L (ref 96–112)
CO2: 23 meq/L (ref 19–32)
Creat: 0.59 mg/dL (ref 0.50–1.10)
GLUCOSE: 69 mg/dL — AB (ref 70–99)
POTASSIUM: 4.7 meq/L (ref 3.5–5.3)
SODIUM: 135 meq/L (ref 135–145)
TOTAL PROTEIN: 7.6 g/dL (ref 6.0–8.3)
Total Bilirubin: 0.3 mg/dL (ref 0.2–1.2)

## 2014-02-15 LAB — TSH: TSH: 1.997 u[IU]/mL (ref 0.350–4.500)

## 2014-02-15 NOTE — Patient Instructions (Signed)
Return to clinic for any obstetric concerns or go to [facility] for evaluation  

## 2014-02-15 NOTE — Progress Notes (Signed)
Patient is Spanish-speaking only, Spanish interpreter present for this encounter. Accompanied by her husband. Clinic ultrasound showed [id number] viable IUP. On review of blood sugars, she has three fastings >100.  A few elevated postprandials in 130s (about 2 or 3).  Will increase Glyburide to 2.5 mg po qhs, continue Metformin 1000 mg bid and diet adherence. Initial prenatal labs, baseline DM labs and TSH checked today.  Patient also wants quad screen, will be drawn later. [facility] given her medical issues and need for multiple [facility]; she understands and accepts this plan. No other significant complaints or concerns.  Routine obstetric precautions reviewed.

## 2014-02-16 LAB — PROTEIN / CREATININE RATIO, URINE
Creatinine, Urine: 99.3 mg/dL
Protein Creatinine Ratio: 0.03 (ref <0.15)
TOTAL PROTEIN, URINE: 3 mg/dL

## 2014-02-16 LAB — CULTURE, OB URINE
Colony Count: NO GROWTH
Organism ID, Bacteria: NO GROWTH

## 2014-02-16 LAB — HIV ANTIBODY (ROUTINE TESTING W REFLEX): HIV: NONREACTIVE

## 2014-02-27 ENCOUNTER — Ambulatory Visit (INDEPENDENT_AMBULATORY_CARE_PROVIDER_SITE_OTHER): Payer: Self-pay | Admitting: Obstetrics & Gynecology

## 2014-02-27 VITALS — BP 128/74 | HR 74 | Temp 98.3°F | Wt 143.7 lb

## 2014-02-27 DIAGNOSIS — O99280 Endocrine, nutritional and metabolic diseases complicating pregnancy, unspecified trimester: Secondary | ICD-10-CM

## 2014-02-27 DIAGNOSIS — E119 Type 2 diabetes mellitus without complications: Secondary | ICD-10-CM

## 2014-02-27 DIAGNOSIS — E039 Hypothyroidism, unspecified: Secondary | ICD-10-CM

## 2014-02-27 DIAGNOSIS — E0590 Thyrotoxicosis, unspecified without thyrotoxic crisis or storm: Secondary | ICD-10-CM

## 2014-02-27 DIAGNOSIS — O24311 Unspecified pre-existing diabetes mellitus in pregnancy, first trimester: Secondary | ICD-10-CM

## 2014-02-27 DIAGNOSIS — O24919 Unspecified diabetes mellitus in pregnancy, unspecified trimester: Secondary | ICD-10-CM

## 2014-02-27 DIAGNOSIS — O99281 Endocrine, nutritional and metabolic diseases complicating pregnancy, first trimester: Secondary | ICD-10-CM

## 2014-02-27 DIAGNOSIS — E079 Disorder of thyroid, unspecified: Secondary | ICD-10-CM

## 2014-02-27 LAB — POCT URINALYSIS DIP (DEVICE)
Bilirubin Urine: NEGATIVE
Glucose, UA: NEGATIVE mg/dL
Hgb urine dipstick: NEGATIVE
KETONES UR: NEGATIVE mg/dL
LEUKOCYTES UA: NEGATIVE
Nitrite: NEGATIVE
PH: 6.5 (ref 5.0–8.0)
PROTEIN: NEGATIVE mg/dL
Specific Gravity, Urine: 1.02 (ref 1.005–1.030)
UROBILINOGEN UA: 0.2 mg/dL (ref 0.0–1.0)

## 2014-02-27 NOTE — Progress Notes (Signed)
Needs OB panel drawn.

## 2014-02-27 NOTE — Patient Instructions (Signed)
Regrese a la clinica cuando tenga su cita. Si tiene problemas o preguntas, llama a la clinica o vaya a la sala de emerg[facility] al [facility].    

## 2014-02-27 NOTE — Progress Notes (Signed)
Patient is Spanish-speaking only, Spanish interpreter present for this encounter. On blood sugar review, just 3 abnormal PP lunch; continue Glyburide 2.5 mg po qpm and Metformin 1000 mg po bid Patient had normal pap in 06/2013; does not need another pap smear. No other complaints or concerns.  Routine obstetric precautions reviewed.

## 2014-02-28 LAB — OBSTETRIC PANEL
ANTIBODY SCREEN: NEGATIVE
BASOS ABS: 0 10*3/uL (ref 0.0–0.1)
BASOS PCT: 0 % (ref 0–1)
EOS PCT: 1 % (ref 0–5)
Eosinophils Absolute: 0.1 10*3/uL (ref 0.0–0.7)
HEMATOCRIT: 38.6 % (ref 36.0–46.0)
Hemoglobin: 13.2 g/dL (ref 12.0–15.0)
Hepatitis B Surface Ag: NEGATIVE
Lymphocytes Relative: 34 % (ref 12–46)
Lymphs Abs: 2.2 10*3/uL (ref 0.7–4.0)
MCH: 32 pg (ref 26.0–34.0)
MCHC: 34.2 g/dL (ref 30.0–36.0)
MCV: 93.5 fL (ref 78.0–100.0)
MONO ABS: 0.4 10*3/uL (ref 0.1–1.0)
Monocytes Relative: 6 % (ref 3–12)
Neutro Abs: 3.8 10*3/uL (ref 1.7–7.7)
Neutrophils Relative %: 59 % (ref 43–77)
PLATELETS: 259 10*3/uL (ref 150–400)
RBC: 4.13 MIL/uL (ref 3.87–5.11)
RDW: 14.5 % (ref 11.5–15.5)
Rh Type: POSITIVE
Rubella: 6.75 Index — ABNORMAL HIGH (ref <0.90)
WBC: 6.5 10*3/uL (ref 4.0–10.5)

## 2014-03-20 ENCOUNTER — Encounter: Payer: Self-pay | Admitting: Obstetrics and Gynecology

## 2014-03-20 ENCOUNTER — Ambulatory Visit (INDEPENDENT_AMBULATORY_CARE_PROVIDER_SITE_OTHER): Payer: Self-pay | Admitting: Obstetrics and Gynecology

## 2014-03-20 VITALS — BP 129/77 | HR 80 | Temp 98.3°F | Wt 142.6 lb

## 2014-03-20 DIAGNOSIS — E0590 Thyrotoxicosis, unspecified without thyrotoxic crisis or storm: Secondary | ICD-10-CM

## 2014-03-20 DIAGNOSIS — O24311 Unspecified pre-existing diabetes mellitus in pregnancy, first trimester: Secondary | ICD-10-CM

## 2014-03-20 DIAGNOSIS — O24919 Unspecified diabetes mellitus in pregnancy, unspecified trimester: Secondary | ICD-10-CM

## 2014-03-20 DIAGNOSIS — E119 Type 2 diabetes mellitus without complications: Secondary | ICD-10-CM

## 2014-03-20 DIAGNOSIS — O99280 Endocrine, nutritional and metabolic diseases complicating pregnancy, unspecified trimester: Secondary | ICD-10-CM

## 2014-03-20 DIAGNOSIS — E039 Hypothyroidism, unspecified: Secondary | ICD-10-CM

## 2014-03-20 DIAGNOSIS — E079 Disorder of thyroid, unspecified: Secondary | ICD-10-CM

## 2014-03-20 DIAGNOSIS — Z23 Encounter for immunization: Secondary | ICD-10-CM

## 2014-03-20 DIAGNOSIS — O99282 Endocrine, nutritional and metabolic diseases complicating pregnancy, second trimester: Secondary | ICD-10-CM

## 2014-03-20 LAB — POCT URINALYSIS DIP (DEVICE)
BILIRUBIN URINE: NEGATIVE
GLUCOSE, UA: NEGATIVE mg/dL
Hgb urine dipstick: NEGATIVE
KETONES UR: NEGATIVE mg/dL
Leukocytes, UA: NEGATIVE
Nitrite: NEGATIVE
Protein, ur: NEGATIVE mg/dL
SPECIFIC GRAVITY, URINE: 1.02 (ref 1.005–1.030)
Urobilinogen, UA: 0.2 mg/dL (ref 0.0–1.0)
pH: 6.5 (ref 5.0–8.0)

## 2014-03-20 NOTE — Progress Notes (Signed)
Patient is doing well without complaints. Patient is following the diet and all CBGs are within range. Patient with majority of fastings values in the 60's. She states she feels very jittery and diaphoretic. Will decrease glyburide to 1.25 mg qpm. Anatomy ultrasound ordered

## 2014-03-20 NOTE — Progress Notes (Signed)
Has some back pain. Would like flu vaccine.

## 2014-03-20 NOTE — Progress Notes (Signed)
04/24/14 @ 830a.  Spanish interpreter present.

## 2014-04-24 ENCOUNTER — Ambulatory Visit (HOSPITAL_COMMUNITY)
Admission: RE | Admit: 2014-04-24 | Discharge: 2014-04-24 | Disposition: A | Discharge status: Home / Self Care | Payer: Self-pay | Source: Ambulatory Visit | Attending: Obstetrics and Gynecology | Admitting: Obstetrics and Gynecology

## 2014-04-24 ENCOUNTER — Other Ambulatory Visit: Payer: Self-pay | Admitting: Obstetrics and Gynecology

## 2014-04-24 DIAGNOSIS — O24311 Unspecified pre-existing diabetes mellitus in pregnancy, first trimester: Secondary | ICD-10-CM

## 2014-04-24 DIAGNOSIS — E039 Hypothyroidism, unspecified: Secondary | ICD-10-CM | POA: Insufficient documentation

## 2014-04-24 DIAGNOSIS — E119 Type 2 diabetes mellitus without complications: Secondary | ICD-10-CM | POA: Insufficient documentation

## 2014-04-24 DIAGNOSIS — Z3A00 Weeks of gestation of pregnancy not specified: Secondary | ICD-10-CM | POA: Insufficient documentation

## 2014-04-24 DIAGNOSIS — O99280 Endocrine, nutritional and metabolic diseases complicating pregnancy, unspecified trimester: Secondary | ICD-10-CM | POA: Insufficient documentation

## 2014-04-24 DIAGNOSIS — O99282 Endocrine, nutritional and metabolic diseases complicating pregnancy, second trimester: Secondary | ICD-10-CM

## 2014-04-24 DIAGNOSIS — O24119 Pre-existing diabetes mellitus, type 2, in pregnancy, unspecified trimester: Secondary | ICD-10-CM | POA: Insufficient documentation

## 2014-04-24 DIAGNOSIS — O24312 Unspecified pre-existing diabetes mellitus in pregnancy, second trimester: Secondary | ICD-10-CM

## 2014-04-24 IMAGING — US US OB DETAIL+14 WK
1 series · 12 of 28 positions shown · non-contrast
Comparison: none

[Series 1: us ob comp +14 wk mfm · 12 of 105 slices shown]
[im 4/105]
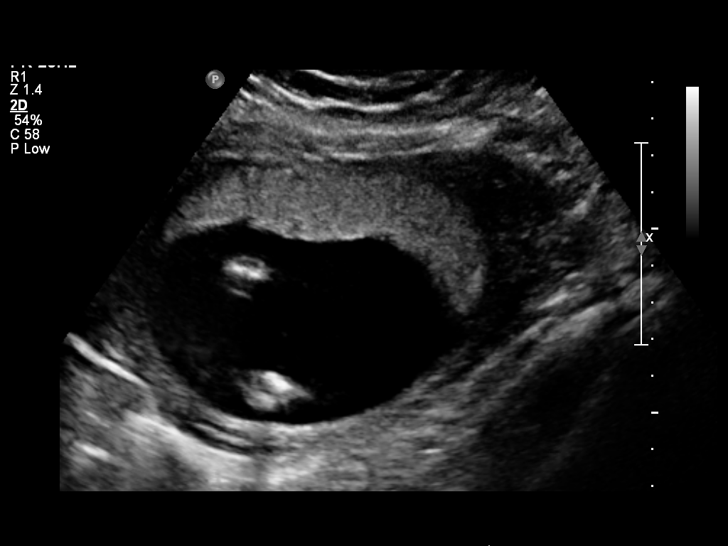
[im 12/105]
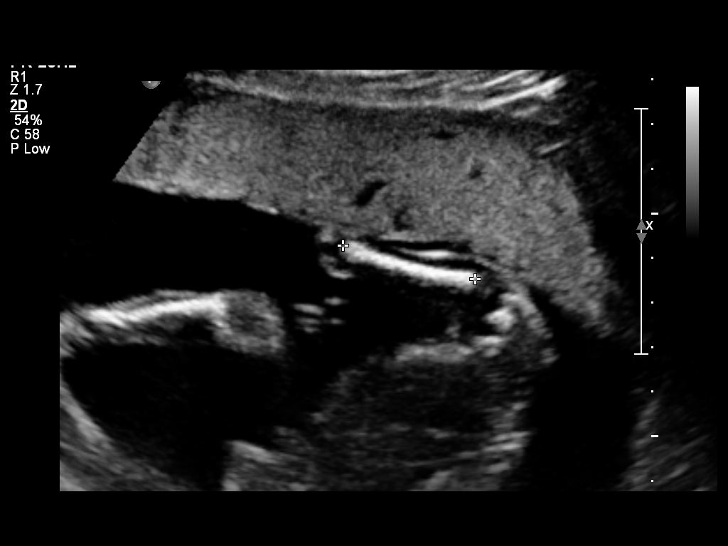
[im 20/105]
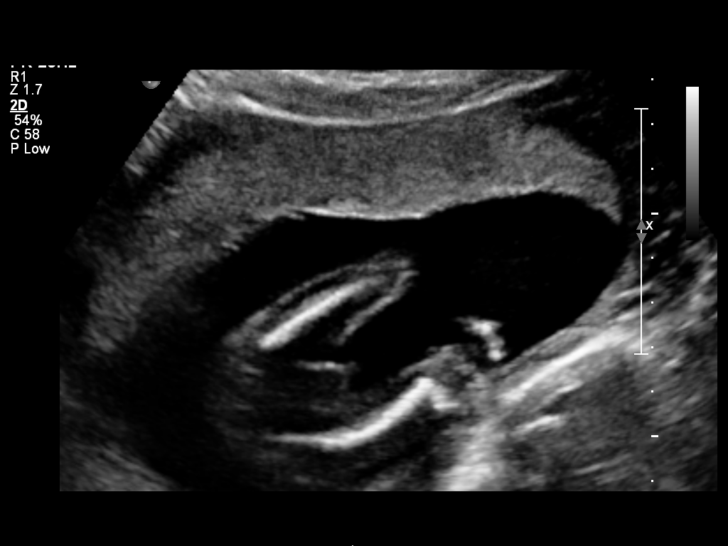
[im 31/105]
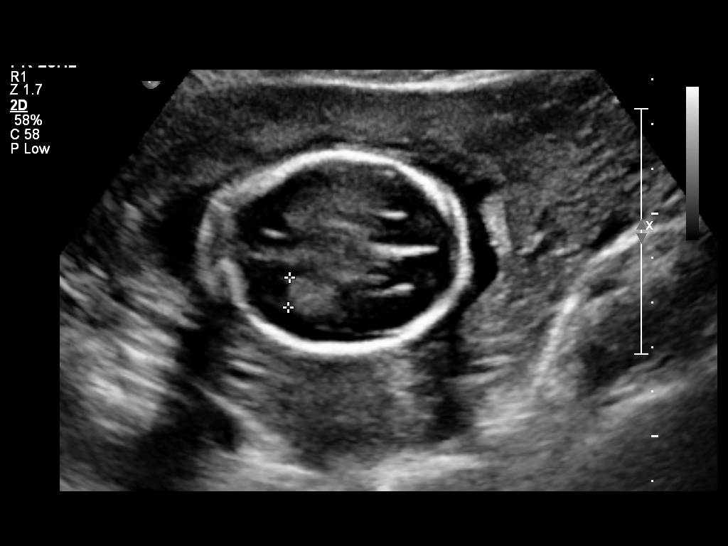
[im 39/105]
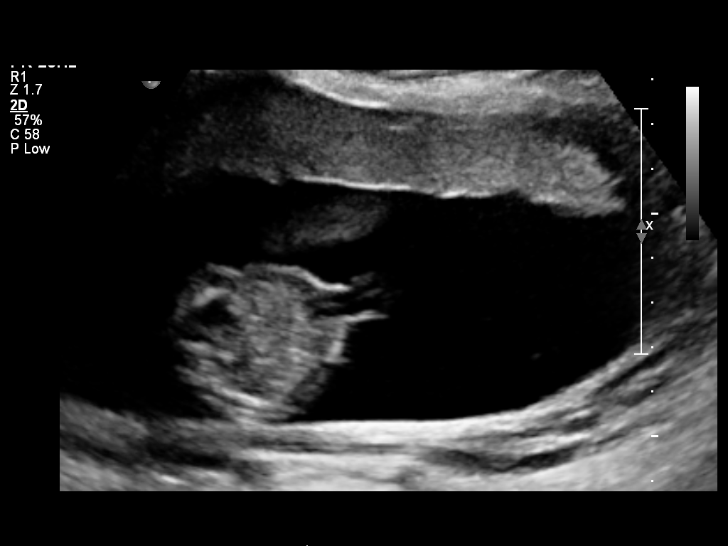
[im 47/105]
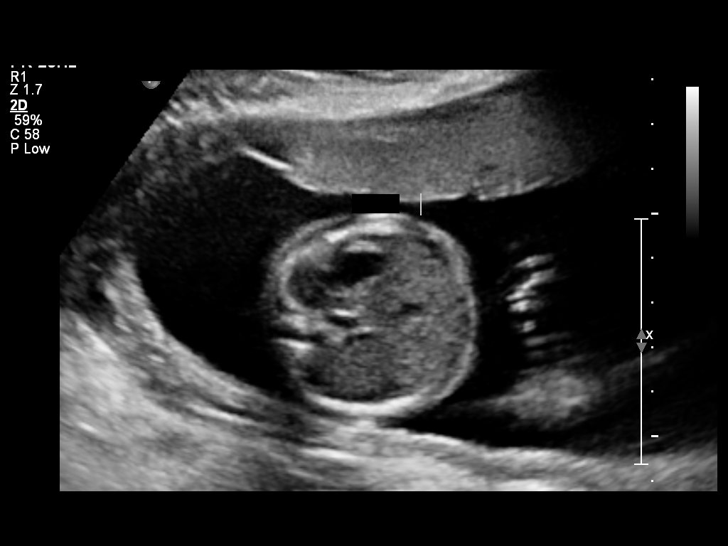
[im 58/105]
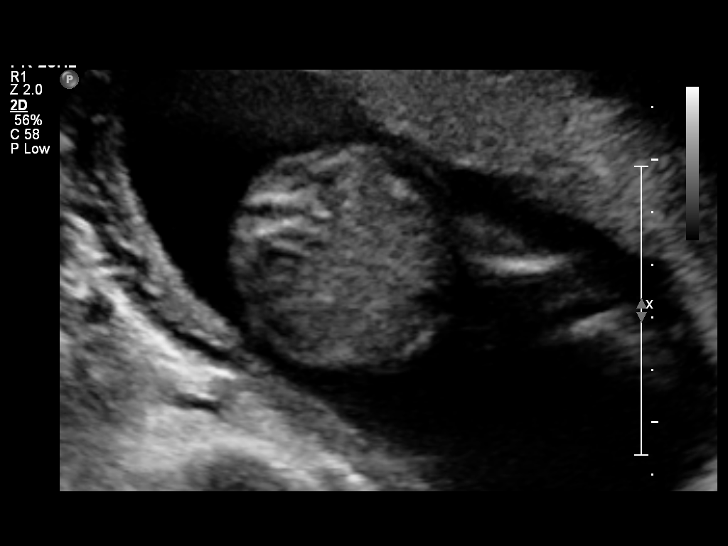
[im 66/105]
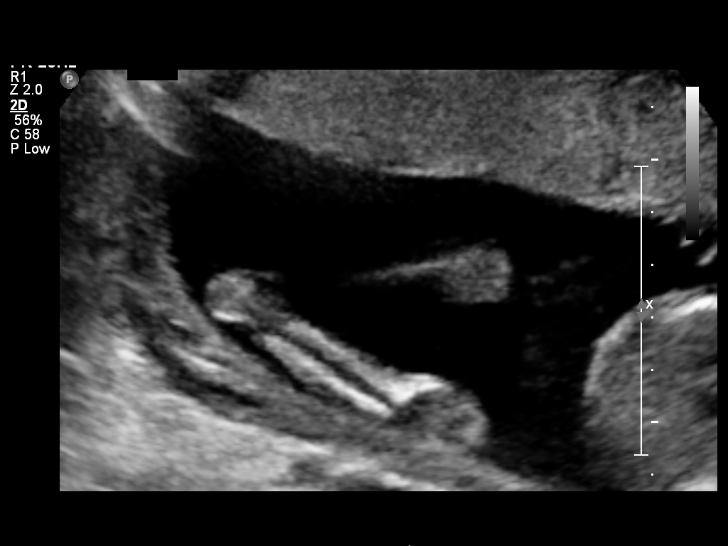
[im 74/105]
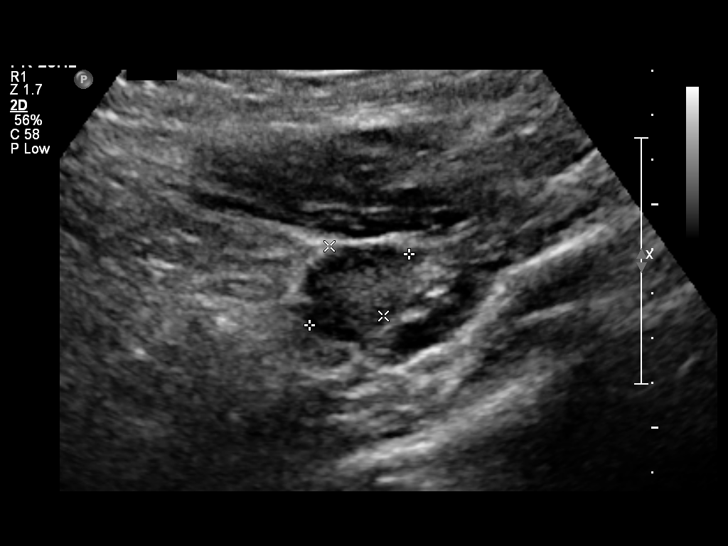
[im 85/105]
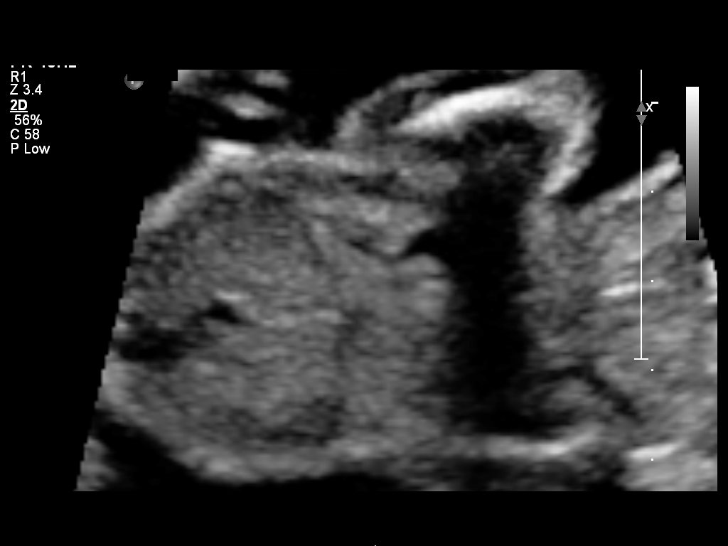
[im 93/105]
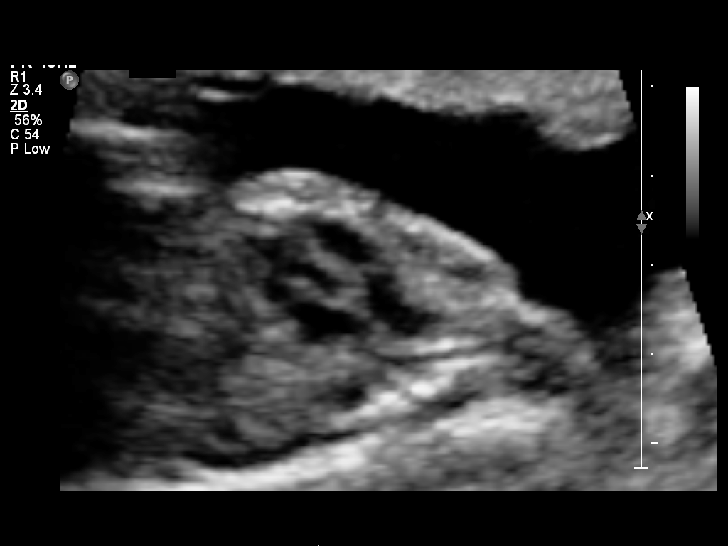
[im 101/105]
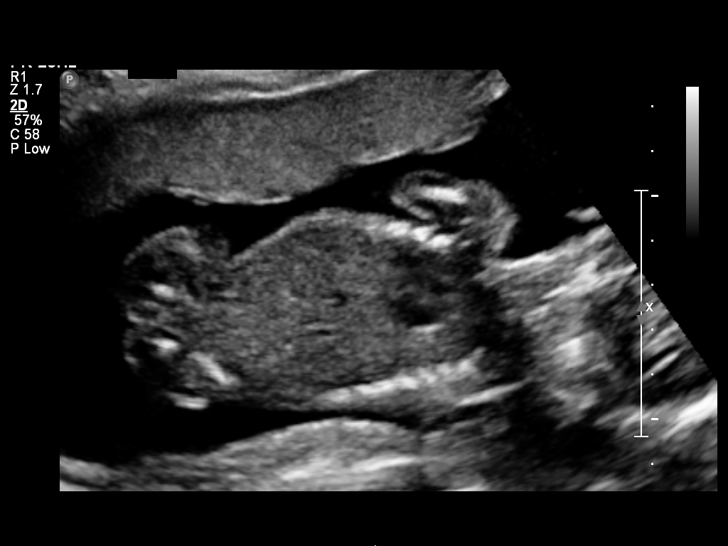

[12 of 28 positions shown; findings below may reference images not displayed]

OBSTETRICS REPORT
                      (Signed Final 04/24/2014 [DATE])

             NERIDA

Service(s) Provided

 US OB DETAIL + 14 WK                                  76811.0
Indications

 Detailed fetal anatomic survey                        Z36
 Diabetes - Pregestational, 2nd trimester -
 metformin
 Hypothyroid w/meds
 20 weeks gestation of pregnancy
Fetal Evaluation

 Num Of Fetuses:    1
 Fetal Heart Rate:  150                          bpm
 Cardiac Activity:  Observed
 Presentation:      Cephalic
 Placenta:          Anterior, above cervical os
 P. Cord            Not well visualized
 Insertion:

 Amniotic Fluid
 AFI FV:      Subjectively within normal limits
                                             Larg Pckt:     5.1  cm
Biometry

 BPD:     45.4  mm     G. Age:  19w 5d                CI:        75.33   70 - 86
                                                      FL/HC:      19.8   16.8 -

 HC:     165.9  mm     G. Age:  19w 2d        8  %    HC/AC:      1.08   1.09 -

 AC:     153.3  mm     G. Age:  20w 4d       52  %    FL/BPD:
 FL:      32.9  mm     G. Age:  20w 2d       42  %    FL/AC:      21.5   20 - 24
 HUM:     30.1  mm     G. Age:  20w 0d       41  %
 CER:     18.5  mm     G. Age:  18w 2d      < 5  %
 NFT:     3.98  mm

 Est. FW:     344  gm    0 lb 12 oz      48  %
Gestational Age

 LMP:           20w 2d        Date:  12/03/13                 EDD:   09/09/14
 U/S Today:     20w 0d                                        EDD:   09/11/14
 Best:          20w 2d     Det. By:  LMP  (12/03/13)          EDD:   09/09/14
Anatomy

 Cranium:          Appears normal         Aortic Arch:      Appears normal
 Fetal Cavum:      Appears normal         Ductal Arch:      Appears normal
 Ventricles:       Appears normal         Diaphragm:        Appears normal
 Choroid Plexus:   Appears normal         Stomach:          Appears normal, left
                                                            sided
 Cerebellum:       Appears normal         Abdomen:          Appears normal
 Posterior Fossa:  Appears normal         Abdominal Wall:   Appears nml (cord
                                                            insert, abd wall)
 Nuchal Fold:      Appears normal         Cord Vessels:     Appears normal (3
                                                            vessel cord)
 Face:             Appears normal         Kidneys:          Appear normal
                   (orbits and profile)
 Lips:             Appears normal         Bladder:          Appears normal
 Heart:            Appears normal         Spine:            Appears normal
                   (4CH, axis, and
                   situs)
 RVOT:             Appears normal         Lower             Appears normal
                                          Extremities:
 LVOT:             Appears normal         Upper             Appears normal
                                          Extremities:

 Other:  Male gender. Heels and 5th digit visualized.
Targeted Anatomy

 Fetal Central Nervous System
 Lat. Ventricles:  6.6                    Cisterna Magna:
Cervix Uterus Adnexa

 Cervical Length:    4.1      cm

 Cervix:       Normal appearance by transabdominal scan.

 Left Ovary:    Within normal limits.
 Right Ovary:   Within normal limits.
Impression

 Single IUP at 20w 2d
 Pregestataional diabetes on Metformin, Hypothyroidism
 Normal fetal anatomic survey
 No markers associated with aneuploidy noted
 Normal amniotic fluid volume
Recommendations

 Offer fetal echo due to pregestational diabetes
 Recommend follow-up ultrasound examination in 6 weeks for
 growth and monthy thereafter due to pregestational diabetes

 ALMA IR EDMANTAS STILIUS with us.  Please do not hesitate to contact

## 2014-04-26 ENCOUNTER — Encounter: Payer: Self-pay | Admitting: Obstetrics and Gynecology

## 2014-04-27 ENCOUNTER — Ambulatory Visit (INDEPENDENT_AMBULATORY_CARE_PROVIDER_SITE_OTHER): Payer: Self-pay | Admitting: Obstetrics & Gynecology

## 2014-04-27 VITALS — BP 126/67 | HR 88 | Wt 144.8 lb

## 2014-04-27 DIAGNOSIS — O24312 Unspecified pre-existing diabetes mellitus in pregnancy, second trimester: Secondary | ICD-10-CM

## 2014-04-27 DIAGNOSIS — E039 Hypothyroidism, unspecified: Secondary | ICD-10-CM

## 2014-04-27 DIAGNOSIS — O24912 Unspecified diabetes mellitus in pregnancy, second trimester: Secondary | ICD-10-CM

## 2014-04-27 DIAGNOSIS — O99282 Endocrine, nutritional and metabolic diseases complicating pregnancy, second trimester: Secondary | ICD-10-CM

## 2014-04-27 LAB — POCT URINALYSIS DIP (DEVICE)
BILIRUBIN URINE: NEGATIVE
GLUCOSE, UA: NEGATIVE mg/dL
HGB URINE DIPSTICK: NEGATIVE
KETONES UR: NEGATIVE mg/dL
NITRITE: NEGATIVE
Protein, ur: NEGATIVE mg/dL
SPECIFIC GRAVITY, URINE: 1.025 (ref 1.005–1.030)
Urobilinogen, UA: 0.2 mg/dL (ref 0.0–1.0)
pH: 7 (ref 5.0–8.0)

## 2014-04-27 MED ORDER — ASPIRIN 81 MG PO TABS: 81.0000 mg | ORAL_TABLET | Freq: Every day | ORAL | Status: DC

## 2014-04-27 NOTE — Progress Notes (Signed)
[employer] Interpreter Marly Adams.

## 2014-04-27 NOTE — Patient Instructions (Signed)
Second Trimester of Pregnancy The second trimester is from week 13 through week 28, months 4 through 6. The second trimester is often a time when you feel your best. Your body has also adjusted to being pregnant, and you begin to feel better physically. Usually, morning sickness has lessened or quit completely, you may have more energy, and you may have an increase in appetite. The second trimester is also a time when the fetus is growing rapidly. At the end of the sixth month, the fetus is about 9 inches long and weighs about 1 pounds. You will likely begin to feel the baby move (quickening) between 18 and 20 weeks of the pregnancy. BODY CHANGES Your body goes through many changes during pregnancy. The changes vary from woman to woman.   Your weight will continue to increase. You will notice your lower abdomen bulging out.  You may begin to get stretch marks on your hips, abdomen, and breasts.  You may develop headaches that can be relieved by medicines approved by your health care provider.  You may urinate more often because the fetus is pressing on your bladder.  You may develop or continue to have heartburn as a result of your pregnancy.  You may develop constipation because certain hormones are causing the muscles that push waste through your intestines to slow down.  You may develop hemorrhoids or swollen, bulging veins (varicose veins).  You may have back pain because of the weight gain and pregnancy hormones relaxing your joints between the bones in your pelvis and as a result of a shift in weight and the muscles that support your balance.  Your breasts will continue to grow and be tender.  Your gums may bleed and may be sensitive to brushing and flossing.  Dark spots or blotches (chloasma, mask of pregnancy) may develop on your face. This will likely fade after the baby is born.  A dark line from your belly button to the pubic area (linea nigra) may appear. This will likely fade  after the baby is born.  You may have changes in your hair. These can include thickening of your hair, rapid growth, and changes in texture. Some women also have hair loss during or after pregnancy, or hair that feels dry or thin. Your hair will most likely return to normal after your baby is born. WHAT TO EXPECT AT YOUR PRENATAL VISITS During a routine prenatal visit:  You will be weighed to make sure you and the fetus are growing normally.  Your blood pressure will be taken.  Your abdomen will be measured to track your baby's growth.  The fetal heartbeat will be listened to.  Any test results from the previous visit will be discussed. Your health care provider may ask you:  How you are feeling.  If you are feeling the baby move.  If you have had any abnormal symptoms, such as leaking fluid, bleeding, severe headaches, or abdominal cramping.  If you have any questions. Other tests that may be performed during your second trimester include:  Blood tests that check for:  Low iron levels (anemia).  Gestational diabetes (between 24 and 28 weeks).  Rh antibodies.  Urine tests to check for infections, diabetes, or protein in the urine.  An ultrasound to confirm the proper growth and development of the baby.  An amniocentesis to check for possible genetic problems.  Fetal screens for spina bifida and Down syndrome. HOME CARE INSTRUCTIONS   Avoid all smoking, herbs, alcohol, and unprescribed   drugs. These chemicals affect the formation and growth of the baby.  Follow your health care provider's instructions regarding medicine use. There are medicines that are either safe or unsafe to take during pregnancy.  Exercise only as directed by your health care provider. Experiencing uterine cramps is a good sign to stop exercising.  Continue to eat regular, healthy meals.  Wear a good support bra for breast tenderness.  Do not use hot tubs, steam rooms, or saunas.  Wear your  seat belt at all times when driving.  Avoid raw meat, uncooked cheese, cat litter boxes, and soil used by cats. These carry germs that can cause birth defects in the baby.  Take your prenatal vitamins.  Try taking a stool softener (if your health care provider approves) if you develop constipation. Eat more high-fiber foods, such as fresh vegetables or fruit and whole grains. Drink plenty of fluids to keep your urine clear or pale yellow.  Take warm sitz baths to soothe any pain or discomfort caused by hemorrhoids. Use hemorrhoid cream if your health care provider approves.  If you develop varicose veins, wear support hose. Elevate your feet for 15 minutes, 3-4 times a day. Limit salt in your diet.  Avoid heavy lifting, wear low heel shoes, and practice good posture.  Rest with your legs elevated if you have leg cramps or low back pain.  Visit your dentist if you have not gone yet during your pregnancy. Use a soft toothbrush to brush your teeth and be gentle when you floss.  A sexual relationship may be continued unless your health care provider directs you otherwise.  Continue to go to all your prenatal visits as directed by your health care provider. SEEK MEDICAL CARE IF:   You have dizziness.  You have mild pelvic cramps, pelvic pressure, or nagging pain in the abdominal area.  You have persistent nausea, vomiting, or diarrhea.  You have a bad smelling vaginal discharge.  You have pain with urination. SEEK IMMEDIATE MEDICAL CARE IF:   You have a fever.  You are leaking fluid from your vagina.  You have spotting or bleeding from your vagina.  You have severe abdominal cramping or pain.  You have rapid weight gain or loss.  You have shortness of breath with chest pain.  You notice sudden or extreme swelling of your face, hands, ankles, feet, or legs.  You have not felt your baby move in over an hour.  You have severe headaches that do not go away with  medicine.  You have vision changes. Document Released: 06/17/2001 Document Revised: 06/28/2013 Document Reviewed: 08/24/2012 ExitCare Patient Information 2015 [employer]. This information is not intended to replace advice given to you by your health care provider. Make sure you discuss any questions you have with your health care provider.  

## 2014-04-27 NOTE — Progress Notes (Signed)
FBS <96, PP <128, continue present medications. Nl anatomy on US 3 days ago. Needs fetal echo and eye exam.

## 2014-05-08 ENCOUNTER — Encounter: Payer: Self-pay | Admitting: Obstetrics and Gynecology

## 2014-05-18 ENCOUNTER — Ambulatory Visit (INDEPENDENT_AMBULATORY_CARE_PROVIDER_SITE_OTHER): Payer: Self-pay | Admitting: Obstetrics & Gynecology

## 2014-05-18 ENCOUNTER — Encounter: Payer: Self-pay | Admitting: Obstetrics & Gynecology

## 2014-05-18 VITALS — BP 128/77 | HR 81 | Wt 148.1 lb

## 2014-05-18 DIAGNOSIS — O24919 Unspecified diabetes mellitus in pregnancy, unspecified trimester: Secondary | ICD-10-CM

## 2014-05-18 DIAGNOSIS — O24319 Unspecified pre-existing diabetes mellitus in pregnancy, unspecified trimester: Secondary | ICD-10-CM

## 2014-05-18 LAB — POCT URINALYSIS DIP (DEVICE)
BILIRUBIN URINE: NEGATIVE
GLUCOSE, UA: NEGATIVE mg/dL
HGB URINE DIPSTICK: NEGATIVE
Ketones, ur: NEGATIVE mg/dL
Leukocytes, UA: NEGATIVE
NITRITE: NEGATIVE
Protein, ur: NEGATIVE mg/dL
SPECIFIC GRAVITY, URINE: 1.01 (ref 1.005–1.030)
Urobilinogen, UA: 0.2 mg/dL (ref 0.0–1.0)
pH: 6 (ref 5.0–8.0)

## 2014-05-18 LAB — TSH: TSH: 4.32 u[IU]/mL (ref 0.350–4.500)

## 2014-05-18 NOTE — Progress Notes (Signed)
appt made with koala eye for 12/3 @ 10:45

## 2014-05-18 NOTE — Progress Notes (Signed)
Routine visit. Good FM. No problems. Her sugars are amazing! She has her fetal echo today and I will order an eye exam. Check TSH today.

## 2014-05-31 ENCOUNTER — Ambulatory Visit (INDEPENDENT_AMBULATORY_CARE_PROVIDER_SITE_OTHER): Payer: Self-pay | Admitting: Obstetrics & Gynecology

## 2014-05-31 VITALS — BP 114/71 | HR 66 | Temp 98.1°F | Wt 147.4 lb

## 2014-05-31 DIAGNOSIS — O99282 Endocrine, nutritional and metabolic diseases complicating pregnancy, second trimester: Secondary | ICD-10-CM

## 2014-05-31 DIAGNOSIS — E039 Hypothyroidism, unspecified: Secondary | ICD-10-CM

## 2014-05-31 LAB — POCT URINALYSIS DIP (DEVICE)
BILIRUBIN URINE: NEGATIVE
Glucose, UA: 500 mg/dL — AB
Hgb urine dipstick: NEGATIVE
KETONES UR: NEGATIVE mg/dL
Leukocytes, UA: NEGATIVE
NITRITE: NEGATIVE
PH: 7 (ref 5.0–8.0)
PROTEIN: NEGATIVE mg/dL
Specific Gravity, Urine: 1.02 (ref 1.005–1.030)
UROBILINOGEN UA: 0.2 mg/dL (ref 0.0–1.0)

## 2014-05-31 NOTE — Patient Instructions (Signed)
Segundo trimestre de embarazo (Second Trimester of Pregnancy) El segundo trimestre va desde la semana13 hasta la 28, desde el cuarto hasta el sexto mes, y suele ser el momento en el que mejor se siente. Su organismo se ha adaptado a estar embarazada y comienza a sentirse fsicamente mejor. En general, las nuseas matutinas han disminuido o han desaparecido completamente, p El segundo trimestre es tambin la poca en la que el feto se desarrolla rpidamente. Hacia el final del sexto mes, el feto mide aproximadamente 9pulgadas (23cm) y pesa alrededor de 1 libras (700g). Es probable que sienta que el beb se mueve (da pataditas) entre las 18 y 20semanas del embarazo. CAMBIOS EN EL ORGANISMO Su organismo atraviesa por muchos cambios durante el embarazo, y estos varan de una mujer a otra.   Seguir aumentando de peso. Notar que la parte baja del abdomen sobresale.  Podrn aparecer las primeras estras en las caderas, el abdomen y las mamas.  Es posible que tenga dolores de cabeza que pueden aliviarse con los medicamentos que su mdico autorice.  Tal vez tenga necesidad de orinar con ms frecuencia porque el feto est ejerciendo presin sobre la vejiga.  Debido al embarazo podr sentir acidez estomacal con frecuencia.  Puede estar estreida, ya que ciertas hormonas enlentecen los movimientos de los msculos que empujan los desechos a travs de los intestinos.  Pueden aparecer hemorroides o abultarse e hincharse las venas (venas varicosas).  Puede tener dolor de espalda que se debe al aumento de peso y a que las hormonas del embarazo relajan las articulaciones entre los huesos de la pelvis, y como consecuencia de la modificacin del peso y los msculos que mantienen el equilibrio.  Las mamas seguirn creciendo y le dolern.  Las encas pueden sangrar y estar sensibles al cepillado y al hilo dental.  Pueden aparecer zonas oscuras o manchas (cloasma, mscara del embarazo) en el rostro que  probablemente se atenuarn despus del nacimiento del beb.  Es posible que se forme una lnea oscura desde el ombligo hasta la zona del pubis (linea nigra) que probablemente se atenuarn despus del nacimiento del beb.  Tal vez haya cambios en el cabello que pueden incluir su engrosamiento, crecimiento rpido y cambios en la textura. Adems, a algunas mujeres se les cae el cabello durante o despus del embarazo, o tienen el cabello seco o fino. Lo ms probable es que el cabello se le normalice despus del nacimiento del beb. QU DEBE ESPERAR EN LAS CONSULTAS PRENATALES Durante una visita prenatal de rutina:  La pesarn para asegurarse de que usted y el feto estn creciendo normalmente.  Le tomarn la presin arterial.  Le medirn el abdomen para controlar el desarrollo del beb.  Se escucharn los latidos cardacos fetales.  Se evaluarn los resultados de los estudios solicitados en visitas anteriores. El mdico puede preguntarle lo siguiente:  Cmo se siente.  Si siente los movimientos del beb.  Si ha tenido sntomas anormales, como prdida de lquido, sangrado, dolores de cabeza intensos o clicos abdominales.  Si tiene alguna pregunta. Otros estudios que podrn realizarse durante el segundo trimestre incluyen lo siguiente:  Anlisis de sangre para detectar:  Concentraciones de hierro bajas (anemia).  Diabetes gestacional (entre la semana 24 y la 28).  Anticuerpos Rh.  Anlisis de orina para detectar infecciones, diabetes o protenas en la orina.  Una ecografa para confirmar que el beb crece y se desarrolla correctamente.  Una amniocentesis para diagnosticar posibles problemas genticos.  Estudios del feto para descartar espina   bfida y sndrome de Down. INSTRUCCIONES PARA EL CUIDADO EN EL HOGAR   Evite fumar, consumir hierbas, beber alcohol y tomar frmacos que no le hayan recetado. Estas sustancias qumicas afectan la formacin y el desarrollo del beb.  Siga  las indicaciones del mdico en relacin con el uso de medicamentos. Durante el embarazo, hay medicamentos que son seguros de tomar y otros que no.  Haga actividad fsica solo en la forma indicada por el mdico. Sentir clicos uterinos es un buen signo para detener la actividad fsica.  Contine comiendo alimentos que sanos con regularidad.  Use un sostn que le brinde buen soporte si le duelen las mamas.  No se d baos de inmersin en agua caliente, baos turcos ni saunas.  Colquese el cinturn de seguridad cuando conduzca.  No coma carne cruda ni queso sin cocinar; evite el contacto con las bandejas sanitarias de los gatos y la tierra que estos animales usan. Estos elementos contienen grmenes que pueden causar defectos congnitos en el beb.  Tome las vitaminas prenatales.  Si est estreida, pruebe un laxante suave (si el mdico lo autoriza). Consuma ms alimentos ricos en fibra, como vegetales y frutas frescos y cereales integrales. Beba gran cantidad de lquido para mantener la orina de tono claro o color amarillo plido.  Dese baos de asiento con agua tibia para aliviar el dolor o las molestias causadas por las hemorroides. Use una crema para las hemorroides si el mdico la autoriza.  Si tiene venas varicosas, use medias de descanso. Eleve los pies durante 15minutos, 3 o 4veces por da. Limite la cantidad de sal en su dieta.  No levante objetos pesados, use zapatos de tacones bajos y mantenga una buena postura.  Descanse con las piernas elevadas si tiene calambres o dolor de cintura.  Visite a su dentista si an no lo ha hecho durante el embarazo. Use un cepillo de dientes blando para higienizarse los dientes y psese el hilo dental con suavidad.  Puede seguir manteniendo relaciones sexuales, a menos que el mdico le indique lo contrario.  Concurra a todas las visitas prenatales segn las indicaciones de su mdico. SOLICITE ATENCIN MDICA SI:   Tiene mareos.  Siente  clicos leves, presin en la pelvis o dolor persistente en el abdomen.  Tiene nuseas, vmitos o diarrea persistentes.  Tiene secrecin vaginal con mal olor.  Siente dolor al orinar. SOLICITE ATENCIN MDICA DE INMEDIATO SI:   Tiene fiebre.  Tiene una prdida de lquido por la vagina.  Tiene sangrado o pequeas prdidas vaginales.  Siente dolor intenso o clicos en el abdomen.  Sube o baja de peso rpidamente.  Tiene dificultad para respirar y siente dolor de pecho.  Sbitamente se le hinchan mucho el rostro, las manos, los tobillos, los pies o las piernas.  No ha sentido los movimientos del beb durante una hora.  Siente un dolor de cabeza intenso que no se alivia con medicamentos.  Hay cambios en la visin. Document Released: 04/02/2005 Document Revised: 06/28/2013 ExitCare Patient Information 2015 [employer]. This information is not intended to replace advice given to you by your health care provider. Make sure you discuss any questions you have with your health care provider.  

## 2014-05-31 NOTE — Progress Notes (Signed)
Interpreter [employer] Mor[employer] used for this encounter.

## 2014-05-31 NOTE — Progress Notes (Signed)
BG still doing well no change in meds.  States nl echo last week. Nl TSH

## 2014-06-05 ENCOUNTER — Encounter: Payer: Self-pay | Admitting: Obstetrics & Gynecology

## 2014-06-05 ENCOUNTER — Telehealth: Payer: Self-pay | Admitting: Obstetrics & Gynecology

## 2014-06-05 NOTE — Telephone Encounter (Signed)
Called patient informed her of missed appointment today at 8:05. Patient stated she had forgotten about appointment and could not come in today. Patient said she would make her next scheduled appointment 06/12/14.

## 2014-06-12 ENCOUNTER — Ambulatory Visit (INDEPENDENT_AMBULATORY_CARE_PROVIDER_SITE_OTHER): Payer: Self-pay | Admitting: Obstetrics & Gynecology

## 2014-06-12 VITALS — BP 117/65 | HR 73 | Temp 98.1°F | Ht 60.0 in | Wt 151.5 lb

## 2014-06-12 DIAGNOSIS — O24912 Unspecified diabetes mellitus in pregnancy, second trimester: Secondary | ICD-10-CM

## 2014-06-12 DIAGNOSIS — O99282 Endocrine, nutritional and metabolic diseases complicating pregnancy, second trimester: Secondary | ICD-10-CM

## 2014-06-12 DIAGNOSIS — O24911 Unspecified diabetes mellitus in pregnancy, first trimester: Secondary | ICD-10-CM

## 2014-06-12 DIAGNOSIS — E039 Hypothyroidism, unspecified: Secondary | ICD-10-CM

## 2014-06-12 DIAGNOSIS — O24311 Unspecified pre-existing diabetes mellitus in pregnancy, first trimester: Secondary | ICD-10-CM

## 2014-06-12 DIAGNOSIS — E119 Type 2 diabetes mellitus without complications: Secondary | ICD-10-CM

## 2014-06-12 LAB — POCT URINALYSIS DIP (DEVICE)
BILIRUBIN URINE: NEGATIVE
GLUCOSE, UA: NEGATIVE mg/dL
HGB URINE DIPSTICK: NEGATIVE
Ketones, ur: NEGATIVE mg/dL
LEUKOCYTES UA: NEGATIVE
NITRITE: NEGATIVE
PH: 7 (ref 5.0–8.0)
Protein, ur: NEGATIVE mg/dL
Specific Gravity, Urine: 1.015 (ref 1.005–1.030)
UROBILINOGEN UA: 0.2 mg/dL (ref 0.0–1.0)

## 2014-06-12 MED ORDER — GLYBURIDE 2.5 MG PO TABS: 2.5000 mg | ORAL_TABLET | Freq: Every evening | ORAL | Status: DC

## 2014-06-12 MED ORDER — LEVOTHYROXINE SODIUM 50 MCG PO TABS: 50.0000 ug | ORAL_TABLET | Freq: Every day | ORAL | Status: DC

## 2014-06-12 MED ORDER — METFORMIN HCL 500 MG PO TABS: 1000.0000 mg | ORAL_TABLET | Freq: Two times a day (BID) | ORAL | Status: DC

## 2014-06-12 NOTE — Progress Notes (Signed)
Growth U/S with Radiology 07/03/14 @ 815a.  Spanish interpreter present.

## 2014-06-12 NOTE — Progress Notes (Signed)
Translator used.   

## 2014-06-12 NOTE — Progress Notes (Signed)
Nml TSH in pregnancy is less than 3.0.  Will increase synthroid form 25 mcg to 50 mcg.  Recheck TSH in 4 weeks. Fastings All 90 or less.  2 hour pp break 92-122; 2 hour 106-128; 2 hour dinner 99-120.   Pt requesting 3 month prescriptions from [facility] so she doesn't have to go as often. [place] ordered for growth at 28 weeks. Patient was obese when she became pregnant (per patient she was 155 pounds) (BMI >30).  Total weight gain should be 12-20 pounds.

## 2014-06-26 ENCOUNTER — Ambulatory Visit (INDEPENDENT_AMBULATORY_CARE_PROVIDER_SITE_OTHER): Payer: Self-pay | Admitting: Obstetrics and Gynecology

## 2014-06-26 ENCOUNTER — Encounter: Payer: Self-pay | Admitting: Obstetrics and Gynecology

## 2014-06-26 VITALS — BP 116/71 | HR 105 | Temp 97.9°F | Wt 151.6 lb

## 2014-06-26 DIAGNOSIS — O99283 Endocrine, nutritional and metabolic diseases complicating pregnancy, third trimester: Secondary | ICD-10-CM

## 2014-06-26 DIAGNOSIS — E038 Other specified hypothyroidism: Secondary | ICD-10-CM

## 2014-06-26 DIAGNOSIS — O24913 Unspecified diabetes mellitus in pregnancy, third trimester: Secondary | ICD-10-CM

## 2014-06-26 DIAGNOSIS — E039 Hypothyroidism, unspecified: Secondary | ICD-10-CM

## 2014-06-26 DIAGNOSIS — E119 Type 2 diabetes mellitus without complications: Secondary | ICD-10-CM

## 2014-06-26 DIAGNOSIS — O24313 Unspecified pre-existing diabetes mellitus in pregnancy, third trimester: Secondary | ICD-10-CM

## 2014-06-26 LAB — POCT URINALYSIS DIP (DEVICE)
Bilirubin Urine: NEGATIVE
Glucose, UA: NEGATIVE mg/dL
Hgb urine dipstick: NEGATIVE
KETONES UR: NEGATIVE mg/dL
LEUKOCYTES UA: NEGATIVE
NITRITE: NEGATIVE
PH: 6 (ref 5.0–8.0)
PROTEIN: NEGATIVE mg/dL
Specific Gravity, Urine: 1.01 (ref 1.005–1.030)
UROBILINOGEN UA: 0.2 mg/dL (ref 0.0–1.0)

## 2014-06-26 NOTE — Progress Notes (Signed)
Patient is doing well without complaints. FM/PTL precautions reviewed. Patient has a follow up growth ultrasound next week. CBGs reviewed and all within range, continue glyburide and metformin

## 2014-07-03 ENCOUNTER — Ambulatory Visit (HOSPITAL_COMMUNITY)
Admission: RE | Admit: 2014-07-03 | Discharge: 2014-07-03 | Disposition: A | Discharge status: Home / Self Care | Payer: Self-pay | Source: Ambulatory Visit | Attending: Obstetrics & Gynecology | Admitting: Obstetrics & Gynecology

## 2014-07-03 DIAGNOSIS — Z3A30 30 weeks gestation of pregnancy: Secondary | ICD-10-CM | POA: Insufficient documentation

## 2014-07-03 DIAGNOSIS — E039 Hypothyroidism, unspecified: Secondary | ICD-10-CM | POA: Insufficient documentation

## 2014-07-03 DIAGNOSIS — O24113 Pre-existing diabetes mellitus, type 2, in pregnancy, third trimester: Secondary | ICD-10-CM | POA: Insufficient documentation

## 2014-07-03 DIAGNOSIS — O99280 Endocrine, nutritional and metabolic diseases complicating pregnancy, unspecified trimester: Secondary | ICD-10-CM

## 2014-07-03 DIAGNOSIS — O24912 Unspecified diabetes mellitus in pregnancy, second trimester: Secondary | ICD-10-CM

## 2014-07-03 DIAGNOSIS — O99283 Endocrine, nutritional and metabolic diseases complicating pregnancy, third trimester: Secondary | ICD-10-CM | POA: Insufficient documentation

## 2014-07-03 DIAGNOSIS — E119 Type 2 diabetes mellitus without complications: Secondary | ICD-10-CM | POA: Insufficient documentation

## 2014-07-03 IMAGING — US US OB FOLLOW-UP
1 series · 12 of 28 positions shown · non-contrast
Comparison: none

[Series 1: us ob follow-up · 0.22mm/px · 51 acquisitions, 12 frames shown]
[im 2/51]
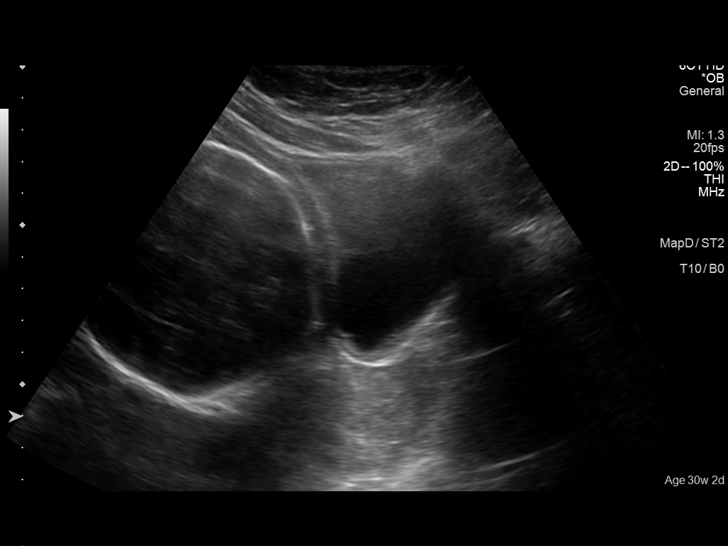
[im 6/51]
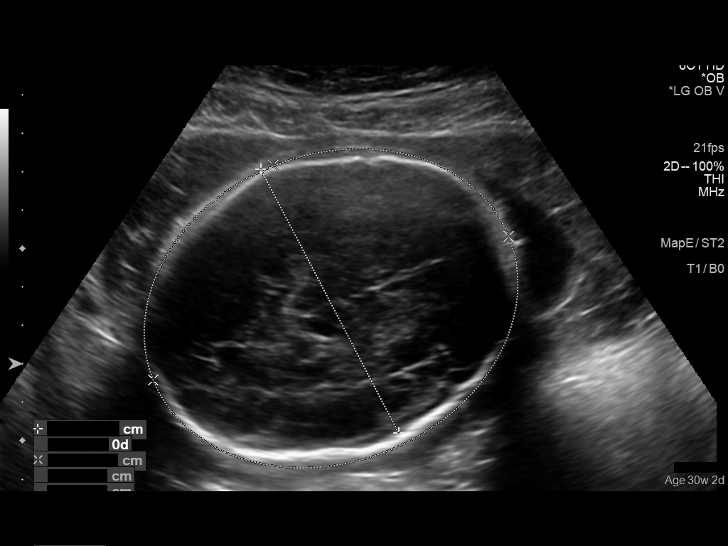
[im 10/51]
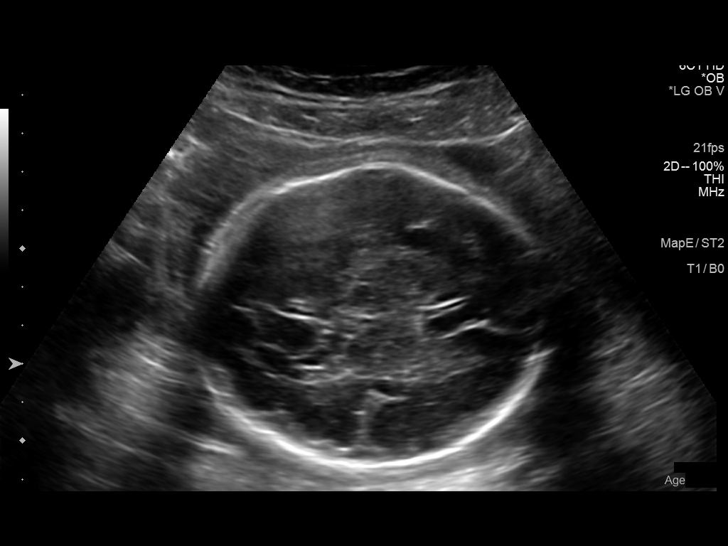
[im 15/51]
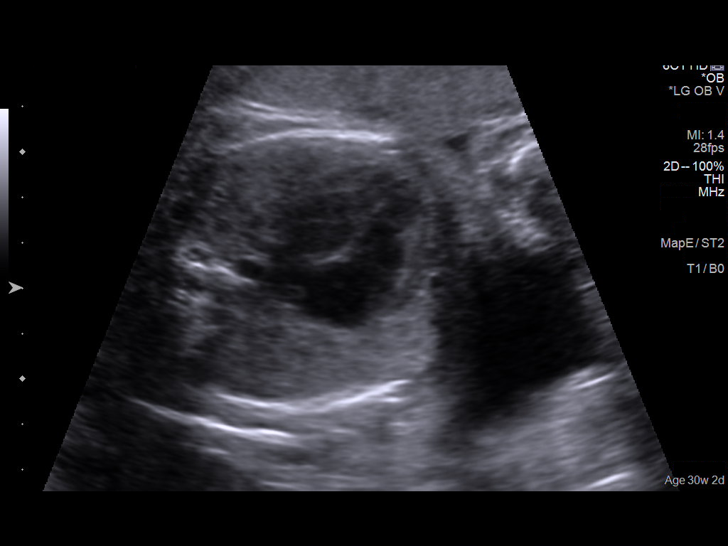
[im 19/51]
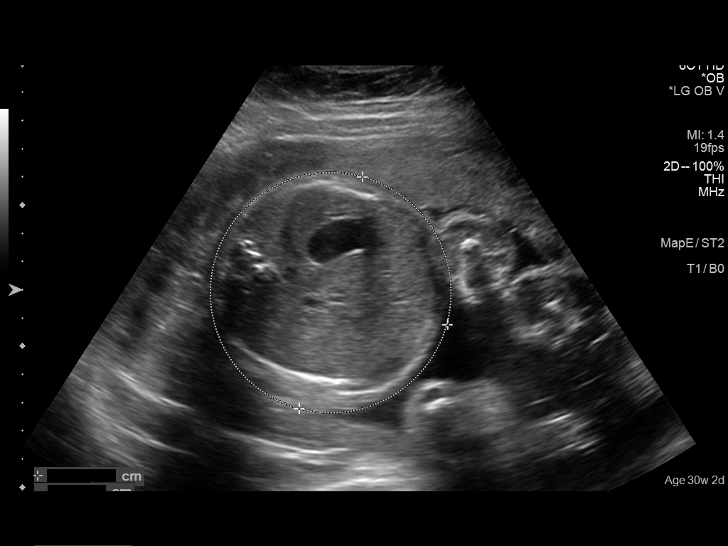
[im 23/51]
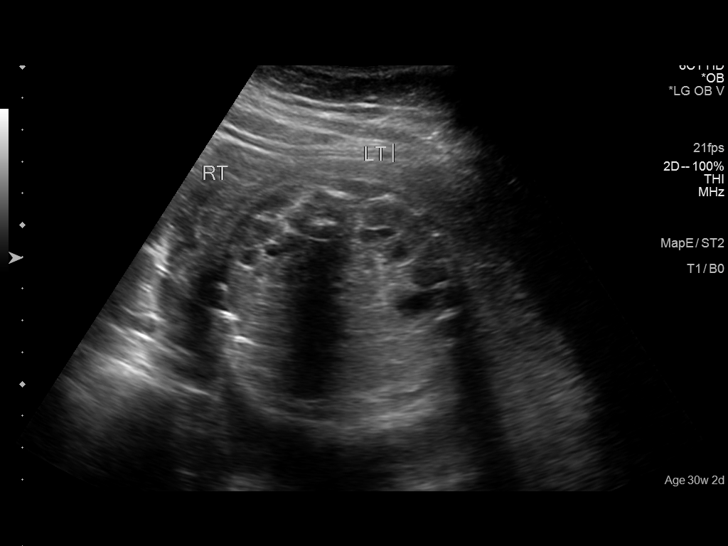
[im 28/51]
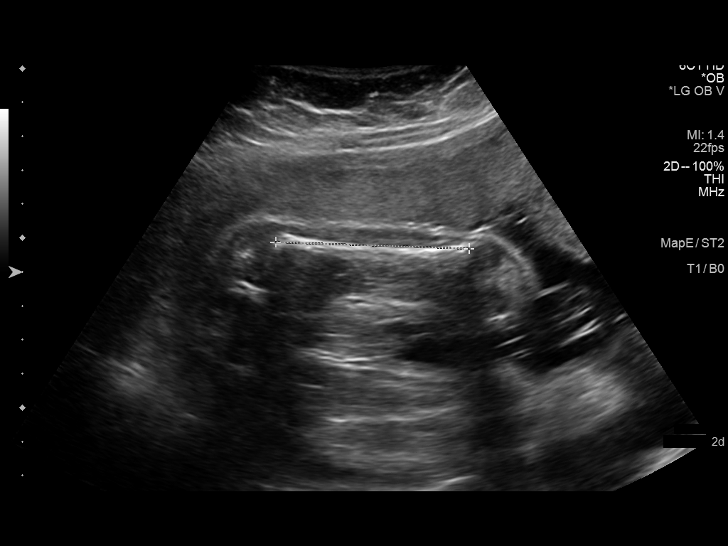
[im 32/51]
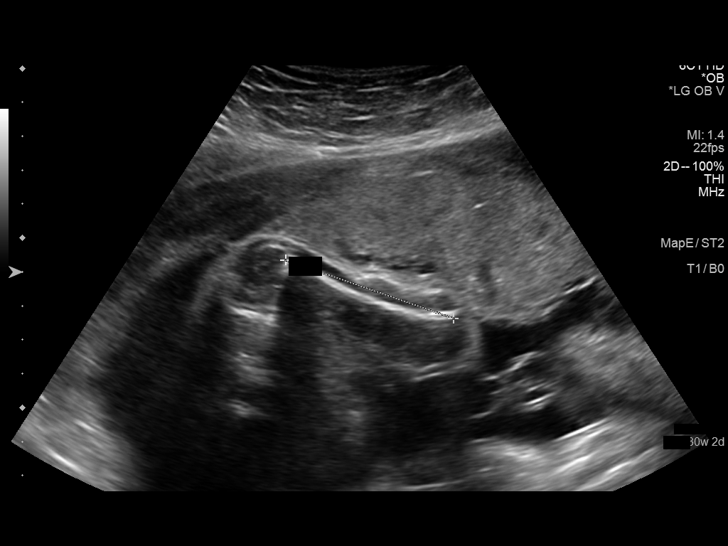
[im 36/51]
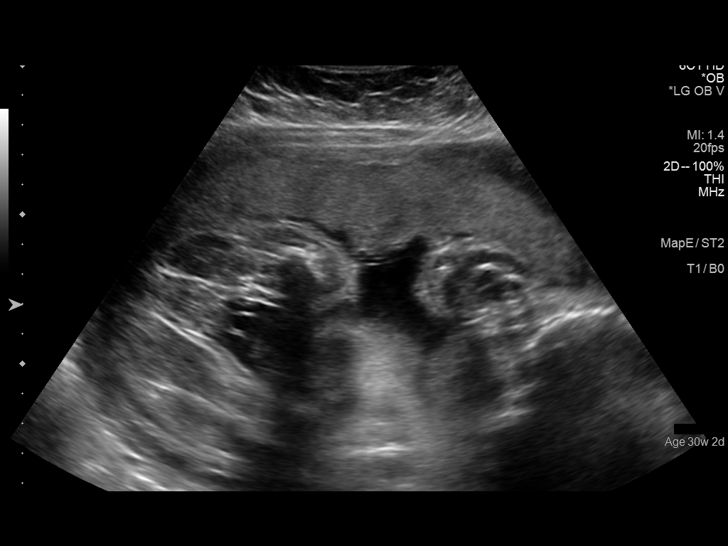
[im 41/51]
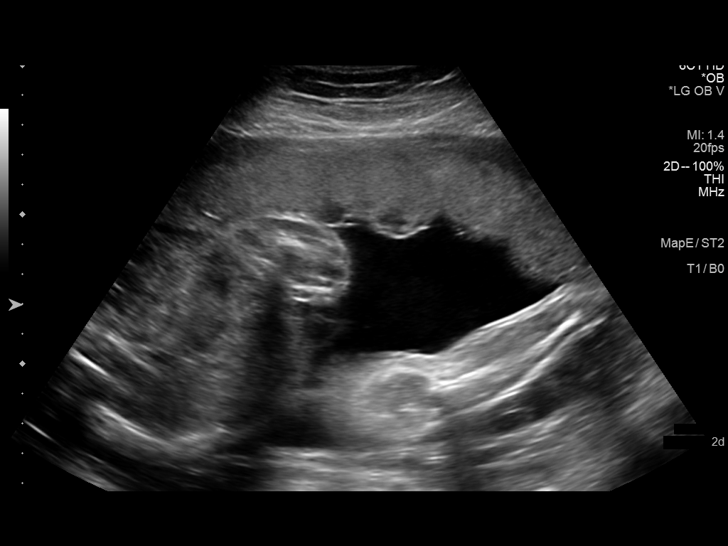
[im 45/51]
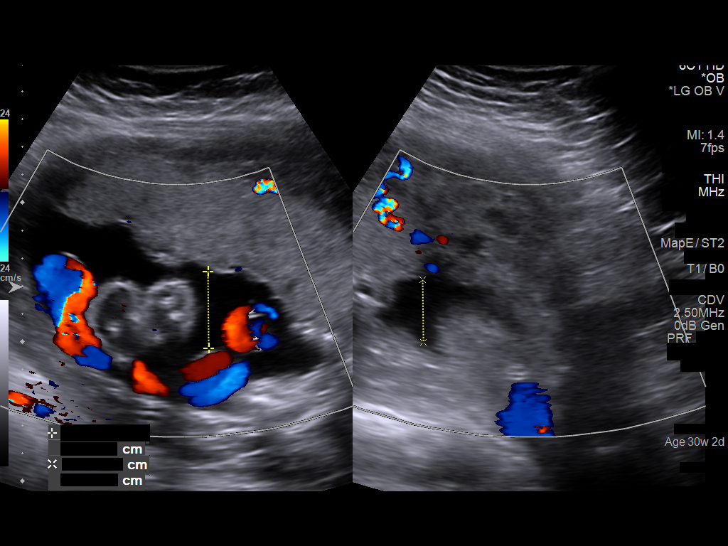
[im 49/51]
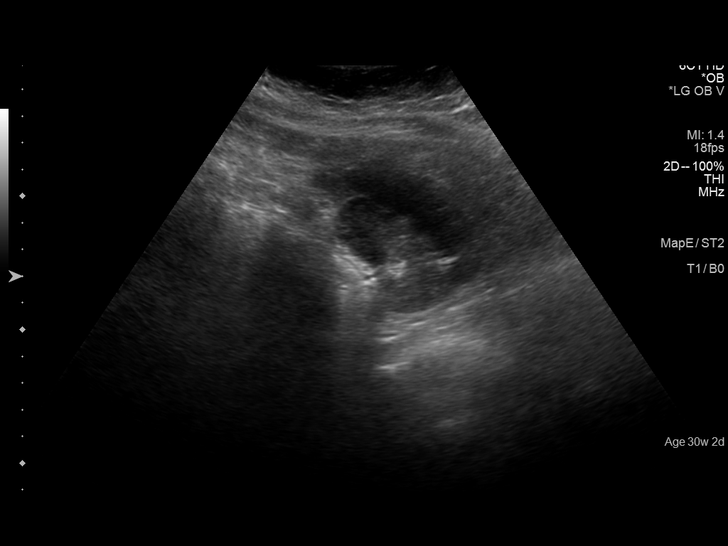

[12 of 28 positions shown; findings below may reference images not displayed]

OBSTETRICS REPORT
                      (Signed Final 07/03/2014 [DATE])

             [facility]

Service(s) Provided

 US OB FOLLOW UP                                       76816.1
Indications

 Diabetes - Pregestational, 2nd trimester -
 metformin
 Hypothyroid w/meds
 30 weeks gestation of pregnancy
Fetal Evaluation

 Num Of Fetuses:    1
 Fetal Heart Rate:  136                          bpm
 Cardiac Activity:  Observed
 Presentation:      Cephalic
 Placenta:          Anterior, above cervical os
 P. Cord            Visualized, central
 Insertion:

 Amniotic Fluid
 AFI FV:      Subjectively within normal limits
 AFI Sum:     12.26   cm       32  %Tile     Larg Pckt:    4.11  cm
 RUQ:   2.76    cm   RLQ:    4.11   cm    LUQ:   2.25    cm   LLQ:    3.14   cm
Biometry

 BPD:     77.2  mm     G. Age:  31w 0d                CI:         80.6   70 - 86
 OFD:     95.8  mm                                    FL/HC:      20.3   19.2 -

 HC:     282.2  mm     G. Age:  30w 6d       33  %    HC/AC:      1.06   0.99 -

 AC:     267.4  mm     G. Age:  30w 6d       62  %    FL/BPD:     74.2   71 - 87
 FL:      57.3  mm     G. Age:  30w 0d       28  %    FL/AC:      21.4   20 - 24
 HUM:       52  mm     G. Age:  30w 2d       54  %

 Est. FW:    0017  gm      3 lb 9 oz     60  %
Gestational Age

 LMP:           30w 2d        Date:  12/03/13                 EDD:   09/09/14
 U/S Today:     30w 5d                                        EDD:   09/06/14
 Best:          30w 2d     Det. By:  LMP  (12/03/13)          EDD:   09/09/14
Anatomy

 Cranium:          Appears normal         Aortic Arch:      Previously seen
 Fetal Cavum:      Previously seen        Ductal Arch:      Previously seen
 Ventricles:       Appears normal         Diaphragm:        Previously seen
 Choroid Plexus:   Previously seen        Stomach:          Appears normal, left
                                                            sided
 Cerebellum:       Previously seen        Abdomen:          Previously seen
 Posterior Fossa:  Previously seen        Abdominal Wall:   Previously seen
 Nuchal Fold:      Previously seen        Cord Vessels:     Previously seen
 Face:             Orbits and profile     Kidneys:          Appear normal
                   previously seen
 Lips:             Previously seen        Bladder:          Appears normal
 Palate:           Previously seen        Spine:            Previously seen
 Heart:            Previously seen        Lower             Previously seen
                                          Extremities:
 RVOT:             Previously seen        Upper             Previously seen
                                          Extremities:
 LVOT:             Previously seen

 Other:  Heels and 5th digit previously seen. Fetus appears to be a male.
Targeted Anatomy

 Fetal Central Nervous System
 Lat. Ventricles:
Cervix Uterus Adnexa

 Cervical Length:    3.43     cm

 Cervix:       Normal appearance by transabdominal scan.
 Uterus:       No abnormality visualized.
 Cul De Sac:   No free fluid seen.
 Left Ovary:    No adnexal mass visualized.
 Right Ovary:   No adnexal mass visualized.
 Adnexa:     No adnexal mass visualized.
Impression

 SIUP at 30+2 weeks
 Normal interval anatomy; anatomic survey complete
 Normal amniotic fluid volume
 Appropriate interval growth with EFW at the 60th %tile
Recommendations

 Follow-up ultrasound for growth in 4 weeks

 TABUE HOLLANDER with us.  Please do not hesitate to contact

## 2014-07-07 NOTE — L&D Delivery Note (Signed)
Patient is 24 y.o. [id number] 2542w2d admitted for induction of labor secondary to gestational hypertension hx of type 2 diabetes.   Delivery Note At 2:58 AM a healthy female was delivered via Vaginal, Spontaneous Delivery (Presentation: Middle Occiput Anterior).  APGAR: 7, 9; weight: pending .   Placenta status: Intact, Spontaneous.  Cord: 3 vessels with the following complications: None.   Anesthesia: Epidural  Episiotomy: None Lacerations:  1st Degree Vaginal  Suture Repair: 3.0 vicryl Est. Blood Loss (mL):  400 (Suspected to be increased secondary to Aspirin)  Mom to postpartum.  Baby to Couplet care / Skin to Skin.  Araceli BoucheRumley, Meridian N 09/09/2014, 3:42 AM  I was gloved and present for the entire delivery/inspection/repair I agree with the above. Cam HaiSHAW, Nima Bamburg CNM 9:26 AM 09/09/2014

## 2014-07-10 ENCOUNTER — Ambulatory Visit (INDEPENDENT_AMBULATORY_CARE_PROVIDER_SITE_OTHER): Payer: Self-pay | Admitting: Obstetrics and Gynecology

## 2014-07-10 VITALS — BP 121/72 | HR 67 | Temp 97.7°F | Wt 151.8 lb

## 2014-07-10 DIAGNOSIS — O24419 Gestational diabetes mellitus in pregnancy, unspecified control: Secondary | ICD-10-CM

## 2014-07-10 LAB — POCT URINALYSIS DIP (DEVICE)
Bilirubin Urine: NEGATIVE
Glucose, UA: NEGATIVE mg/dL
Hgb urine dipstick: NEGATIVE
KETONES UR: NEGATIVE mg/dL
LEUKOCYTES UA: NEGATIVE
Nitrite: NEGATIVE
PH: 7 (ref 5.0–8.0)
PROTEIN: NEGATIVE mg/dL
SPECIFIC GRAVITY, URINE: 1.02 (ref 1.005–1.030)
Urobilinogen, UA: 0.2 mg/dL (ref 0.0–1.0)

## 2014-07-10 NOTE — Progress Notes (Signed)
Regina Jones used for interpreter 

## 2014-07-10 NOTE — Progress Notes (Signed)
24 yo G3P0020 at [redacted]w[redacted]d w/ EDD of 09/21/14 + FM, no LOF, no VB.   Confusing EDD - pt reports 09/19/14, in 1st PNV, reported dated by 8 wk [place] at outside clinic [facility]) w/ EDD of 09/21/14, but results not scanned in. However on 20 and 30 wk [place], estimate  EDD to be 09/09/2014. -- On review of records w/ Dr. Adrian Blackwater, appears US done in clinic on 8/12 at 8w, making formal EDD 09/21/14.  Growth Korea at 28 wks - 60% EFW Fasting - all at goal 2 hr 6/32 > 120 (all < 130) Compliant with metformin and glyburide and ASA 81 mg.  - Reports fetal Echo WNL, has had  - Ordered 32 wk growth [place]  No complaints of thyroid. Compliant w/ synthryoid.

## 2014-07-10 NOTE — Progress Notes (Signed)
Growth U/S 07/31/14 @ 1115a with Radiology.

## 2014-07-24 ENCOUNTER — Ambulatory Visit (INDEPENDENT_AMBULATORY_CARE_PROVIDER_SITE_OTHER): Payer: Self-pay | Admitting: Obstetrics and Gynecology

## 2014-07-24 VITALS — BP 123/82 | HR 72 | Temp 98.4°F | Wt 154.8 lb

## 2014-07-24 DIAGNOSIS — O24311 Unspecified pre-existing diabetes mellitus in pregnancy, first trimester: Secondary | ICD-10-CM

## 2014-07-24 DIAGNOSIS — O24911 Unspecified diabetes mellitus in pregnancy, first trimester: Secondary | ICD-10-CM

## 2014-07-24 DIAGNOSIS — E119 Type 2 diabetes mellitus without complications: Secondary | ICD-10-CM

## 2014-07-24 LAB — CBC
HCT: 39.1 % (ref 36.0–46.0)
HEMOGLOBIN: 13.2 g/dL (ref 12.0–15.0)
MCH: 33.4 pg (ref 26.0–34.0)
MCHC: 33.8 g/dL (ref 30.0–36.0)
MCV: 99 fL (ref 78.0–100.0)
MPV: 9.9 fL (ref 8.6–12.4)
Platelets: 234 10*3/uL (ref 150–400)
RBC: 3.95 MIL/uL (ref 3.87–5.11)
RDW: 14 % (ref 11.5–15.5)
WBC: 6.3 10*3/uL (ref 4.0–10.5)

## 2014-07-24 LAB — POCT URINALYSIS DIP (DEVICE)
Bilirubin Urine: NEGATIVE
GLUCOSE, UA: NEGATIVE mg/dL
Hgb urine dipstick: NEGATIVE
Ketones, ur: NEGATIVE mg/dL
LEUKOCYTES UA: NEGATIVE
NITRITE: NEGATIVE
PH: 6 (ref 5.0–8.0)
PROTEIN: NEGATIVE mg/dL
Specific Gravity, Urine: 1.025 (ref 1.005–1.030)
Urobilinogen, UA: 0.2 mg/dL (ref 0.0–1.0)

## 2014-07-24 LAB — HIV ANTIBODY (ROUTINE TESTING W REFLEX): HIV 1&2 Ab, 4th Generation: NONREACTIVE

## 2014-07-24 LAB — RPR

## 2014-07-24 MED ORDER — METFORMIN HCL 500 MG PO TABS: 1000.0000 mg | ORAL_TABLET | Freq: Two times a day (BID) | ORAL | Status: DC

## 2014-07-24 NOTE — Progress Notes (Signed)
24 yo G3P0020 at 4630w4d w/ EDD of 09/21/14 + FM, no LOF, no VB.   Growth US at 28 wks - 60% EFW Fasting - all at goal 2 hr 11/42 > 120 (all < 130) Compliant with metformin and glyburide and ASA 81 mg.  - Reports fetal Echo WNL, has had  - Ordered 32 wk growth US - scheduled for next Monday  No complaints of thyroid. Compliant w/ synthryoid.

## 2014-07-24 NOTE — Progress Notes (Signed)
Used interpreter Regina HongMariaElena Jones.Thinks she got tdap earlier in pregnancy at other clinic- states she will check and let us know at next visit.

## 2014-07-31 ENCOUNTER — Ambulatory Visit (HOSPITAL_COMMUNITY)
Admission: RE | Admit: 2014-07-31 | Discharge: 2014-07-31 | Disposition: A | Discharge status: Home / Self Care | Payer: Self-pay | Source: Ambulatory Visit | Attending: Obstetrics and Gynecology | Admitting: Obstetrics and Gynecology

## 2014-07-31 DIAGNOSIS — E119 Type 2 diabetes mellitus without complications: Secondary | ICD-10-CM | POA: Insufficient documentation

## 2014-07-31 DIAGNOSIS — Z3A34 34 weeks gestation of pregnancy: Secondary | ICD-10-CM | POA: Insufficient documentation

## 2014-07-31 DIAGNOSIS — O24113 Pre-existing diabetes mellitus, type 2, in pregnancy, third trimester: Secondary | ICD-10-CM | POA: Insufficient documentation

## 2014-07-31 DIAGNOSIS — O99283 Endocrine, nutritional and metabolic diseases complicating pregnancy, third trimester: Secondary | ICD-10-CM | POA: Insufficient documentation

## 2014-07-31 DIAGNOSIS — E039 Hypothyroidism, unspecified: Secondary | ICD-10-CM | POA: Insufficient documentation

## 2014-07-31 DIAGNOSIS — O24419 Gestational diabetes mellitus in pregnancy, unspecified control: Secondary | ICD-10-CM | POA: Insufficient documentation

## 2014-07-31 IMAGING — US US OB FOLLOW-UP
2 series · 12 of 28 positions shown · non-contrast
Comparison: none

[Series 1: us ob follow up · 38 acquisitions, 9 frames shown (1 of 2)]
[im 2/38]
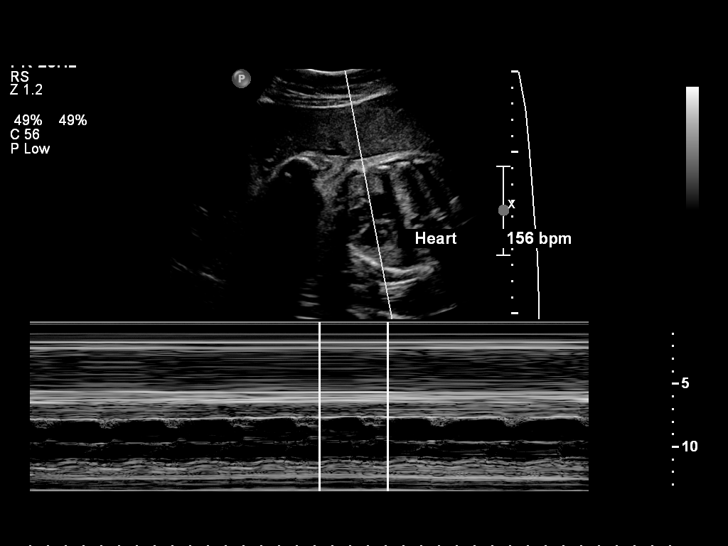
[im 6/38]
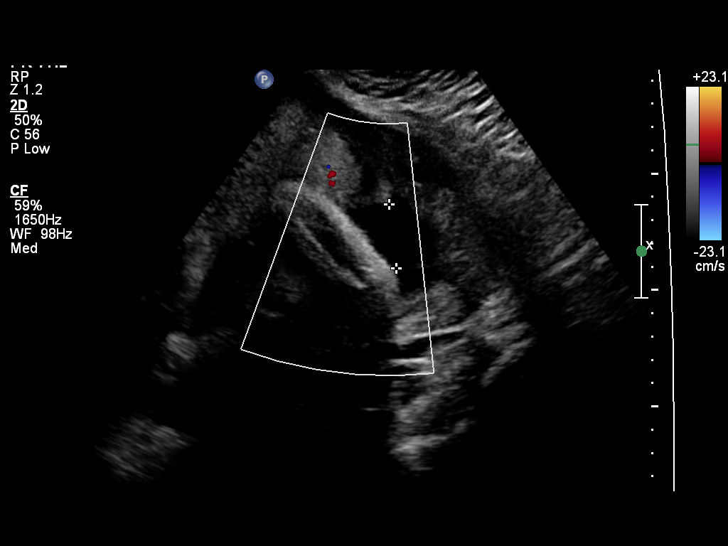
[im 10/38]
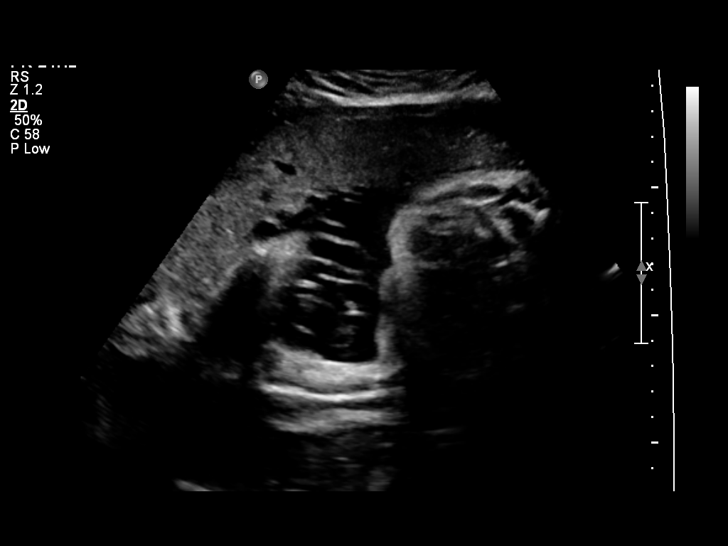
[im 16/38]
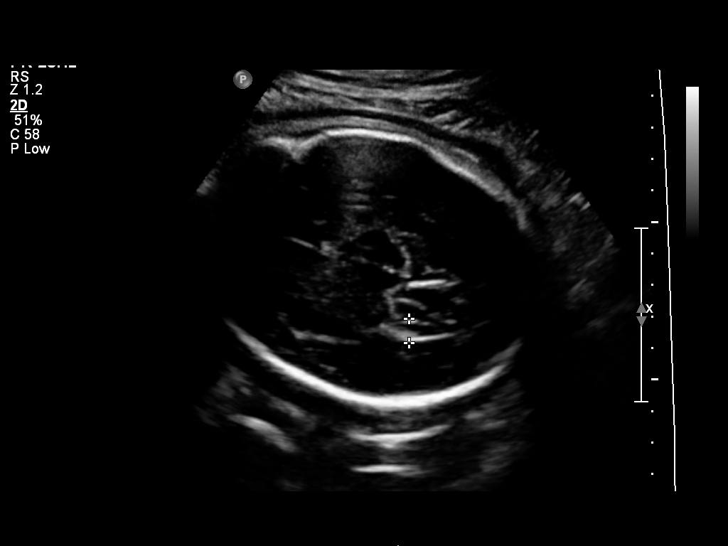
[im 20/38]
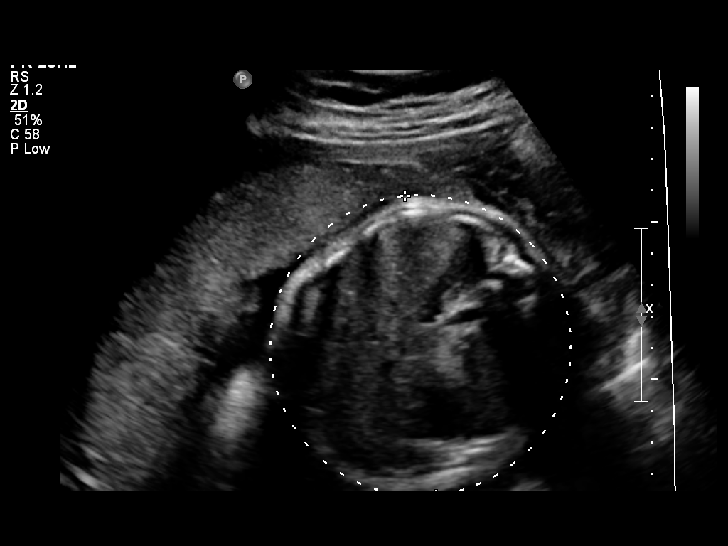
[im 24/38]
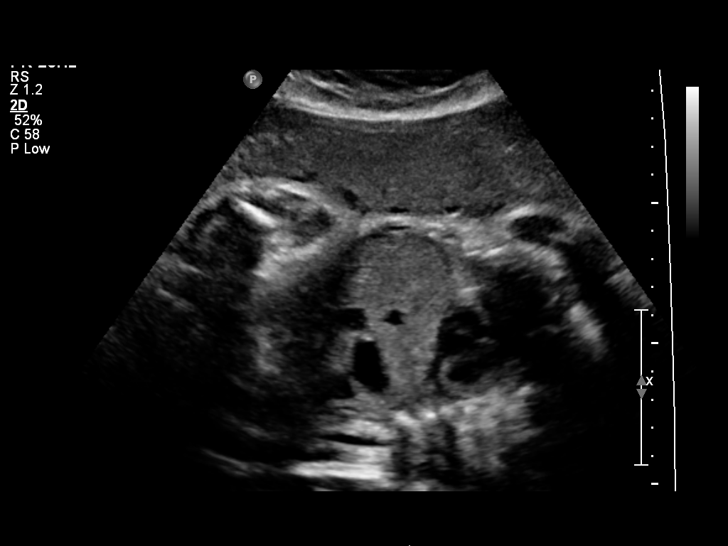
[im 30/38]
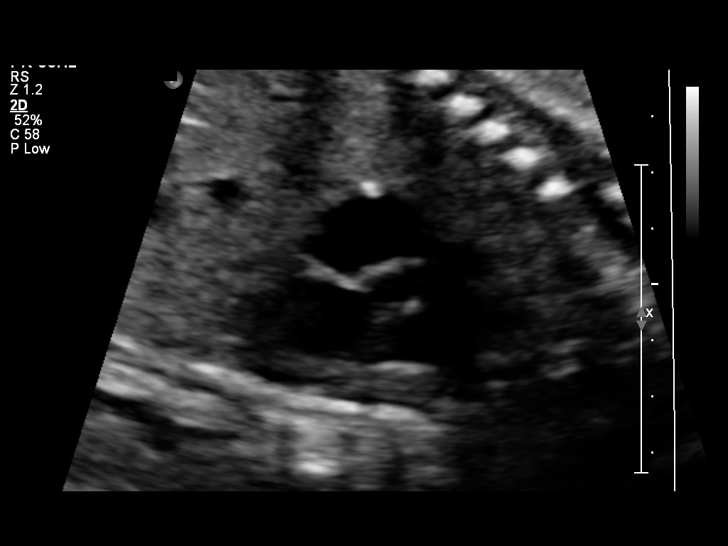
[im 34/38]
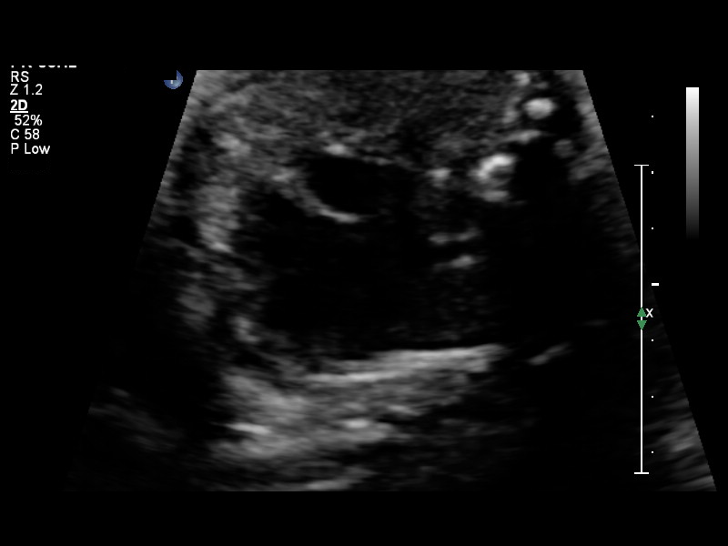
[im 38/38]
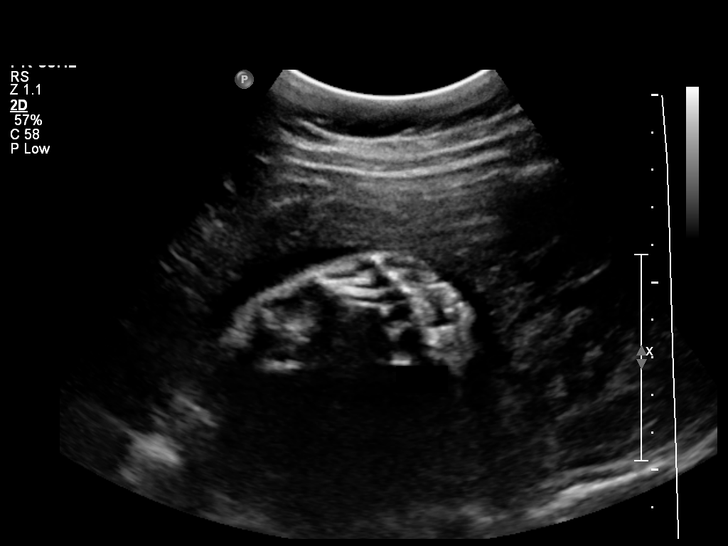

[Series 1: us ob follow up · 3 of 14 slices shown (2 of 2)]
[im 4/14]
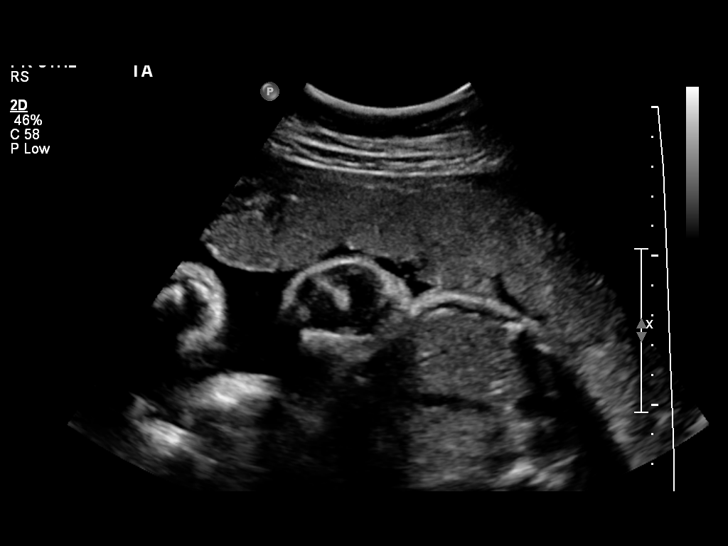
[im 8/14]
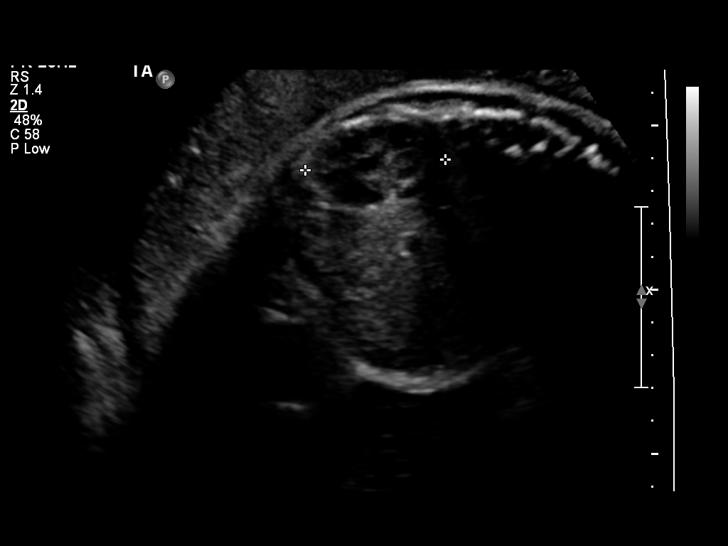
[im 12/14]
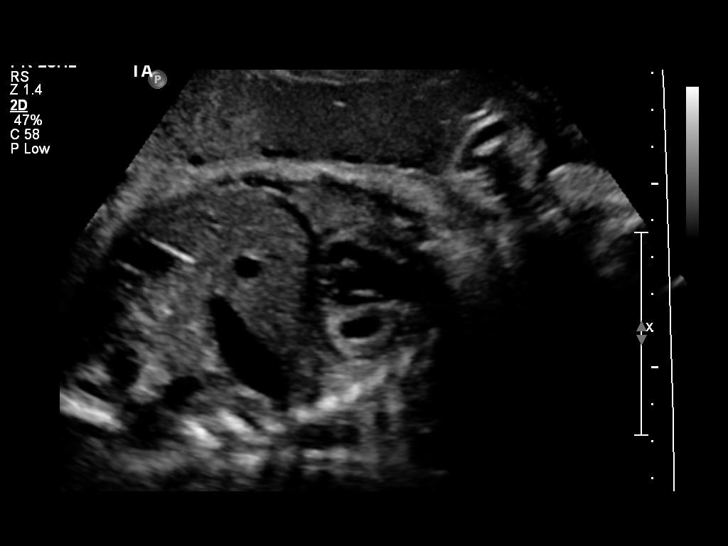

[12 of 28 positions shown; findings below may reference images not displayed]

OBSTETRICS REPORT
                      (Signed Final 07/31/2014 [DATE])

             [place]

Service(s) Provided

 US OB FOLLOW UP                                       76816.1
Indications

 Diabetes - Pregestational, 2nd trimester -
 metformin
 Hypothyroid w/meds
 34 weeks gestation of pregnancy
Fetal Evaluation

 Num Of Fetuses:    1
 Fetal Heart Rate:  156                          bpm
 Cardiac Activity:  Observed
 Presentation:      Cephalic
 Placenta:          Anterior, above cervical os
 P. Cord            Previously Visualized
 Insertion:

 Amniotic Fluid
 AFI FV:      Subjectively within normal limits
 AFI Sum:     13.75   cm       47  %Tile     Larg Pckt:     5.6  cm
 RUQ:   5.6     cm   RLQ:    2.89   cm    LUQ:   2.5     cm   LLQ:    2.76   cm
Biometry

 BPD:       82  mm     G. Age:  33w 0d                CI:        72.51   70 - 86
                                                      FL/HC:      21.7   19.4 -

 HC:     306.3  mm     G. Age:  34w 1d       14  %    HC/AC:      1.03   0.96 -

 AC:     298.5  mm     G. Age:  33w 6d       41  %    FL/BPD:     81.2   71 - 87
 FL:      66.6  mm     G. Age:  34w 2d       41  %    FL/AC:      22.3   20 - 24

 Est. FW:    9099  gm      5 lb 1 oz     52  %
Gestational Age

 LMP:           34w 2d        Date:  12/03/13                 EDD:   09/09/14
 U/S Today:     33w 6d                                        EDD:   09/12/14
 Best:          34w 2d     Det. By:  LMP  (12/03/13)          EDD:   09/09/14
Anatomy
 Cranium:          Appears normal         Aortic Arch:      Previously seen
 Fetal Cavum:      Previously seen        Ductal Arch:      Previously seen
 Ventricles:       Appears normal         Diaphragm:        Appears normal
 Choroid Plexus:   Previously seen        Stomach:          Appears normal, left
                                                            sided
 Cerebellum:       Previously seen        Abdomen:          Previously seen
 Posterior Fossa:  Previously seen        Abdominal Wall:   Previously seen
 Nuchal Fold:      Previously seen        Cord Vessels:     Previously seen
 Face:             Orbits and profile     Kidneys:          Appear normal
                   previously seen
 Lips:             Previously seen        Bladder:          Appears normal
 Palate:           Previously seen        Spine:            Previously seen
 Heart:            Previously seen        Lower             Previously seen
                                          Extremities:
 RVOT:             Appears normal         Upper             Previously seen
                                          Extremities:
 LVOT:             Appears normal

 Other:  Heels and 5th digit previously seen. Fetus appears to be a male.
Cervix Uterus Adnexa

 Cervix:       Not visualized (advanced GA >51wks)
Impression

 Single IUP at 34w 2d
 Class B diabetes on Metformin
 The estimated fetal weight is currently at the 52nd %tile.  The
 AC measures at the 41st %tile.
 Anterior placenta without previa
 Normal amniotic fluid volume
Recommendations

 Continue antenatal testing (2x weekly NSTs with weekly AFIs)
 Recommend delivery at 39 weeks in the absence of other
 complications.
 Follow-up ultrasounds as clinically indicated.

 UBERT ALO with us.  Please do not hesitate to contact

## 2014-08-07 ENCOUNTER — Ambulatory Visit (INDEPENDENT_AMBULATORY_CARE_PROVIDER_SITE_OTHER): Payer: Self-pay | Admitting: Family Medicine

## 2014-08-07 VITALS — BP 125/81 | HR 72 | Temp 97.7°F | Wt 156.6 lb

## 2014-08-07 DIAGNOSIS — O99283 Endocrine, nutritional and metabolic diseases complicating pregnancy, third trimester: Secondary | ICD-10-CM

## 2014-08-07 DIAGNOSIS — O24913 Unspecified diabetes mellitus in pregnancy, third trimester: Secondary | ICD-10-CM

## 2014-08-07 DIAGNOSIS — E039 Hypothyroidism, unspecified: Secondary | ICD-10-CM

## 2014-08-07 DIAGNOSIS — O24313 Unspecified pre-existing diabetes mellitus in pregnancy, third trimester: Secondary | ICD-10-CM

## 2014-08-07 LAB — POCT URINALYSIS DIP (DEVICE)
BILIRUBIN URINE: NEGATIVE
Glucose, UA: NEGATIVE mg/dL
HGB URINE DIPSTICK: NEGATIVE
Ketones, ur: NEGATIVE mg/dL
Leukocytes, UA: NEGATIVE
Nitrite: NEGATIVE
Protein, ur: NEGATIVE mg/dL
SPECIFIC GRAVITY, URINE: 1.015 (ref 1.005–1.030)
Urobilinogen, UA: 0.2 mg/dL (ref 0.0–1.0)
pH: 7 (ref 5.0–8.0)

## 2014-08-07 NOTE — Progress Notes (Signed)
Patient reports occasional stabbing pain in lower abdomen

## 2014-08-07 NOTE — Progress Notes (Signed)
Fasting 70-90. All within range 2hr PP: 101-135 - 6 elevated Good fetal movement. Fetal movement and labor precautions

## 2014-08-07 NOTE — Patient Instructions (Signed)
Cardiotocografa en reposo (Nonstress Test)  La cardiotocografa en reposo es un procedimiento que controla los latidos del corazn del feto. El estudio controla los latidos del corazn cuando el feto est en reposo y mientras se mueve. En un feto sano, habr un aumento de la frecuencia cardaca fetal cuando se mueve o patea. La frecuencia cardaca disminuye en reposo. Este examen ayuda a determinar si el feto est sano. El mdico va a revisar una serie de patrones en el ritmo cardaco para asegurarse de que el beb est creciendo bien. Si hay alguna preocupacin, el mdico puede ordenar pruebas adicionales o puede sugerir otro curso de accin. Este estudio se hace en el tercer trimestre del embarazo y puede ayudar a determinar si es seguro y necesario realizar un parto anticipado. Las razones ms comunes para hacer este examen son:   Ya ha pasado la fecha del parto.  Tiene un embarazo de alto riesgo.  Siente menos movimientos que lo habitual.  Ha perdido un embarazo en el pasado.  El mdico sospecha problemas en el desarrollo fetal.  Tiene mucho o muy poco lquido amnitico. ANTES DEL PROCEDIMIENTO   Coma un alimento justo antes del examen o como se lo indique su mdico. Los alimentos ayudan a estimular los movimientos fetales.  Vaya al bao justo antes del estudio. PROCEDIMIENTO   Le colocarn dos cinturones alrededor del abdomen. Estos cinturones tienen monitores conectados a ellos. Uno registra la frecuencia cardaca fetal y el otro las contracciones uterinas.  Le indicarn que se acueste de lado o permanezca sentada en posicin vertical.  Le darn un pulsador para presionar cuando sienta el movimiento.  Se escucharn los latidos cardacos del feto y se observar en una pantalla. Los latidos cardacos se registran en un papel.  Si el feto parece estar dormido, se le puede pedir que beba un poco de jugo o soda, presione suavemente su abdomen, o haga algo de ruido para  despertarlo. DESPUS DEL PROCEDIMIENTO  El mdico le explicar los resultados del estudio y le har recomendaciones para el futuro prximo.  Document Released: 06/23/2005 Document Revised: 11/07/2013 ExitCare Patient Information 2015 [employer]. This information is not intended to replace advice given to you by your health care provider. Make sure you discuss any questions you have with your health care provider.  

## 2014-08-08 LAB — TSH: TSH: 1.406 u[IU]/mL (ref 0.350–4.500)

## 2014-08-08 LAB — OB RESULTS CONSOLE GC/CHLAMYDIA
CHLAMYDIA, DNA PROBE: NEGATIVE
GC PROBE AMP, GENITAL: NEGATIVE

## 2014-08-09 ENCOUNTER — Telehealth: Payer: Self-pay | Admitting: *Deleted

## 2014-08-09 NOTE — Telephone Encounter (Signed)
-----   Message from Levie HeritageJacob J Stinson, DO sent at 08/08/2014  1:07 PM EST ----- Please let the patient know that her Thyroid level is normal.

## 2014-08-09 NOTE — Telephone Encounter (Signed)
Need to call patient with Spanish interpreter.  

## 2014-08-10 ENCOUNTER — Ambulatory Visit (INDEPENDENT_AMBULATORY_CARE_PROVIDER_SITE_OTHER): Payer: Self-pay | Admitting: *Deleted

## 2014-08-10 VITALS — BP 125/88 | HR 74

## 2014-08-10 DIAGNOSIS — O24313 Unspecified pre-existing diabetes mellitus in pregnancy, third trimester: Secondary | ICD-10-CM

## 2014-08-10 DIAGNOSIS — O24913 Unspecified diabetes mellitus in pregnancy, third trimester: Secondary | ICD-10-CM

## 2014-08-10 LAB — US OB FOLLOW UP

## 2014-08-10 NOTE — Progress Notes (Signed)
NST/AFI Spanish Interpreter Dorita Arias Contractions are present on the monitor, but patient denies feeling the contractions.

## 2014-08-10 NOTE — Progress Notes (Signed)
NST reactive.

## 2014-08-10 NOTE — Telephone Encounter (Addendum)
Called Regina Jones with interpreter Regina Jones . Informed her thyroid level normal.

## 2014-08-15 ENCOUNTER — Ambulatory Visit (INDEPENDENT_AMBULATORY_CARE_PROVIDER_SITE_OTHER): Payer: Self-pay | Admitting: Physician Assistant

## 2014-08-15 VITALS — BP 123/87 | HR 96 | Wt 160.7 lb

## 2014-08-15 DIAGNOSIS — O24913 Unspecified diabetes mellitus in pregnancy, third trimester: Secondary | ICD-10-CM

## 2014-08-15 DIAGNOSIS — O24313 Unspecified pre-existing diabetes mellitus in pregnancy, third trimester: Secondary | ICD-10-CM

## 2014-08-15 LAB — POCT URINALYSIS DIP (DEVICE)
BILIRUBIN URINE: NEGATIVE
GLUCOSE, UA: NEGATIVE mg/dL
HGB URINE DIPSTICK: NEGATIVE
Ketones, ur: NEGATIVE mg/dL
Leukocytes, UA: NEGATIVE
Nitrite: NEGATIVE
Protein, ur: NEGATIVE mg/dL
Specific Gravity, Urine: 1.015 (ref 1.005–1.030)
Urobilinogen, UA: 0.2 mg/dL (ref 0.0–1.0)
pH: 7 (ref 5.0–8.0)

## 2014-08-15 NOTE — Progress Notes (Signed)
34 weeks, no complaints.  Denies LOF, vag discharge, dysuria.  Endorses good fetal movement.  Reactive NST.  Ctx present but not bothersome or regular. RTC next week for twice weekly testing.

## 2014-08-15 NOTE — Patient Instructions (Signed)
Contracciones de Braxton Hicks °(Braxton Hicks Contractions) °Durante el embarazo, pueden presentarse contracciones uterinas que no siempre indican que está en trabajo de parto.  °¿QUÉ SON LAS CONTRACCIONES DE BRAXTON HICKS?  °Las contracciones que se presentan antes del trabajo de parto se conocen como contracciones de Braxton Hicks o falso trabajo de parto. Hacia el final del embarazo (32 a 34 semanas), estas contracciones pueden aparecen con más frecuencia y volverse más intensas. No corresponden al trabajo de parto verdadero porque estas contracciones no producen el agrandamiento (la dilatación) y el afinamiento del cuello del útero. Algunas veces, es difícil distinguirlas del trabajo de parto verdadero porque en algunos casos pueden ser muy intensas, y las personas tienen diferentes niveles de tolerancia al dolor. No debe sentirse avergonzada si concurre al hospital con falso trabajo de parto. En ocasiones, la única forma de saber si el trabajo de parto es verdadero es que el médico determine si hay cambios en el cuello del útero. °Si no hay problemas prenatales u otras complicaciones de salud asociadas con el embarazo, no habrá inconvenientes si la envían a su casa con falso trabajo de parto y espera que comience el verdadero. °CÓMO DIFERENCIAR EL TRABAJO DE PARTO FALSO DEL VERDADERO °Falso trabajo de parto °· Las contracciones del falso trabajo de parto duran menos y no son tan intensas como las verdaderas. °· Generalmente son irregulares. °· A menudo, se sienten en la parte delantera de la parte baja del abdomen y en la ingle, °· y pueden desaparecer cuando camina o cambia de posición mientras está acostada. °· Las contracciones se vuelven más débiles y su duración es menor a medida que el tiempo transcurre. °· Por lo general, no se hacen progresivamente más intensas, regulares y cercanas entre sí como en el caso del trabajo de parto verdadero. °Verdadero trabajo de parto °· Las contracciones del verdadero  trabajo de parto duran de 30 a 70 segundos, son muy regulares y suelen volverse más intensas, y aumenta su frecuencia. °· No desaparecen cuando camina. °· La molestia generalmente se siente en la parte superior del útero y se extiende hacia la zona inferior del abdomen y hacia la cintura. °· El médico podrá examinarla para determinar si el trabajo de parto es verdadero. El examen mostrará si el cuello del útero se está dilatando y afinando. °LO QUE DEBE RECORDAR °· Continúe haciendo los ejercicios habituales y siga otras indicaciones que el médico le dé. °· Tome todos los medicamentos como le indicó el médico. °· Concurra a las visitas prenatales regulares. °· Coma y beba con moderación si cree que está en trabajo de parto. °· Si las contracciones de Braxton Hicks le provocan incomodidad: °¨ Cambie de posición: si está acostada o descansando, camine; si está caminando, descanse. °¨ Siéntese y descanse en una bañera con agua tibia. °¨ Beba 2 o 3 vasos de agua. La deshidratación puede provocar contracciones. °¨ Respire lenta y profundamente varias veces por hora. °¿CUÁNDO DEBO BUSCAR ASISTENCIA MÉDICA INMEDIATA? °Solicite atención médica de inmediato si: °· Las contracciones se intensifican, se hacen más regulares y cercanas entre sí. °· Tiene una pérdida de líquido por la vagina. °· Tiene fiebre. °· Elimina mucosidad manchada con sangre. °· Tiene una hemorragia vaginal abundante. °· Tiene dolor abdominal permanente. °· Tiene un dolor en la zona lumbar que nunca tuvo antes. °· Siente que la cabeza del bebé empuja hacia abajo y ejerce presión en la zona pélvica. °· El bebé no se mueve tanto como solía. °Document Released: 04/02/2005 Document Revised: 06/28/2013 °ExitCare® Patient   Information ©2015 [employer]. This information is not intended to replace advice given to you by your health care provider. Make sure you discuss any questions you have with your health care provider. ° °

## 2014-08-15 NOTE — Progress Notes (Signed)
Interpreter - Adriana Zavala-Martinez present for encounter.  

## 2014-08-17 ENCOUNTER — Ambulatory Visit (INDEPENDENT_AMBULATORY_CARE_PROVIDER_SITE_OTHER): Payer: Self-pay | Admitting: *Deleted

## 2014-08-17 VITALS — BP 127/86 | HR 65

## 2014-08-17 DIAGNOSIS — O24913 Unspecified diabetes mellitus in pregnancy, third trimester: Secondary | ICD-10-CM

## 2014-08-17 DIAGNOSIS — O24313 Unspecified pre-existing diabetes mellitus in pregnancy, third trimester: Secondary | ICD-10-CM

## 2014-08-17 LAB — US OB FOLLOW UP

## 2014-08-17 NOTE — Progress Notes (Signed)
NST performed today was reviewed and was found to be reactive.  AFI normal at 13.6 cm.  Continue recommended antenatal testing and prenatal care.

## 2014-08-21 ENCOUNTER — Other Ambulatory Visit: Payer: Self-pay

## 2014-08-24 ENCOUNTER — Ambulatory Visit (INDEPENDENT_AMBULATORY_CARE_PROVIDER_SITE_OTHER): Payer: Self-pay | Admitting: *Deleted

## 2014-08-24 VITALS — BP 119/80 | HR 83

## 2014-08-24 DIAGNOSIS — O24313 Unspecified pre-existing diabetes mellitus in pregnancy, third trimester: Secondary | ICD-10-CM

## 2014-08-24 DIAGNOSIS — O24913 Unspecified diabetes mellitus in pregnancy, third trimester: Secondary | ICD-10-CM

## 2014-08-24 LAB — US OB FOLLOW UP

## 2014-08-24 NOTE — Progress Notes (Signed)
NST reviewed and reactive.  Criston Chancellor L. Harraway-Smith, M.D., FACOG    

## 2014-08-24 NOTE — Progress Notes (Signed)
Viviana SimplerAlis Herrera - interpreter present for encounter today.

## 2014-08-28 ENCOUNTER — Encounter: Payer: Self-pay | Admitting: Obstetrics and Gynecology

## 2014-08-28 ENCOUNTER — Ambulatory Visit (INDEPENDENT_AMBULATORY_CARE_PROVIDER_SITE_OTHER): Payer: Self-pay | Admitting: Obstetrics and Gynecology

## 2014-08-28 ENCOUNTER — Other Ambulatory Visit: Payer: Self-pay | Admitting: Obstetrics and Gynecology

## 2014-08-28 VITALS — BP 131/83 | HR 80 | Temp 98.0°F | Wt 159.4 lb

## 2014-08-28 DIAGNOSIS — E119 Type 2 diabetes mellitus without complications: Secondary | ICD-10-CM

## 2014-08-28 DIAGNOSIS — O24313 Unspecified pre-existing diabetes mellitus in pregnancy, third trimester: Secondary | ICD-10-CM

## 2014-08-28 DIAGNOSIS — O24913 Unspecified diabetes mellitus in pregnancy, third trimester: Secondary | ICD-10-CM

## 2014-08-28 DIAGNOSIS — O99283 Endocrine, nutritional and metabolic diseases complicating pregnancy, third trimester: Secondary | ICD-10-CM

## 2014-08-28 DIAGNOSIS — E039 Hypothyroidism, unspecified: Secondary | ICD-10-CM

## 2014-08-28 LAB — POCT URINALYSIS DIP (DEVICE)
Bilirubin Urine: NEGATIVE
GLUCOSE, UA: NEGATIVE mg/dL
HGB URINE DIPSTICK: NEGATIVE
KETONES UR: NEGATIVE mg/dL
NITRITE: NEGATIVE
PROTEIN: NEGATIVE mg/dL
Specific Gravity, Urine: 1.015 (ref 1.005–1.030)
UROBILINOGEN UA: 0.2 mg/dL (ref 0.0–1.0)
pH: 7 (ref 5.0–8.0)

## 2014-08-28 NOTE — Progress Notes (Signed)
Patient is doing well without complaints. FM/labor precautions reviewed. Cultures collected NST reviewed and reactive Will schedule 38 week ultrasound CBGs reviewed and all within range- continue glyburide and metformin

## 2014-08-28 NOTE — Progress Notes (Signed)
C/o pain in lower back.  GBS and cultures today.

## 2014-08-29 LAB — GC/CHLAMYDIA PROBE AMP
CT Probe RNA: NEGATIVE
GC PROBE AMP APTIMA: NEGATIVE

## 2014-08-30 ENCOUNTER — Encounter: Payer: Self-pay | Admitting: Obstetrics & Gynecology

## 2014-08-30 LAB — CULTURE, BETA STREP (GROUP B ONLY)

## 2014-09-01 ENCOUNTER — Encounter (HOSPITAL_COMMUNITY): Payer: Self-pay | Admitting: *Deleted

## 2014-09-01 ENCOUNTER — Ambulatory Visit (INDEPENDENT_AMBULATORY_CARE_PROVIDER_SITE_OTHER): Payer: Self-pay | Admitting: *Deleted

## 2014-09-01 ENCOUNTER — Inpatient Hospital Stay (HOSPITAL_COMMUNITY)
Admission: AD | Admit: 2014-09-01 | Discharge: 2014-09-01 | Disposition: A | Discharge status: Home / Self Care | Payer: Self-pay | Source: Ambulatory Visit | Attending: Family Medicine | Admitting: Family Medicine

## 2014-09-01 VITALS — BP 123/91 | HR 99

## 2014-09-01 DIAGNOSIS — O24313 Unspecified pre-existing diabetes mellitus in pregnancy, third trimester: Secondary | ICD-10-CM

## 2014-09-01 DIAGNOSIS — O133 Gestational [pregnancy-induced] hypertension without significant proteinuria, third trimester: Secondary | ICD-10-CM | POA: Insufficient documentation

## 2014-09-01 DIAGNOSIS — O163 Unspecified maternal hypertension, third trimester: Secondary | ICD-10-CM

## 2014-09-01 DIAGNOSIS — Z3A38 38 weeks gestation of pregnancy: Secondary | ICD-10-CM

## 2014-09-01 DIAGNOSIS — O24913 Unspecified diabetes mellitus in pregnancy, third trimester: Secondary | ICD-10-CM

## 2014-09-01 DIAGNOSIS — Z3A37 37 weeks gestation of pregnancy: Secondary | ICD-10-CM | POA: Insufficient documentation

## 2014-09-01 LAB — COMPREHENSIVE METABOLIC PANEL
ALK PHOS: 142 U/L — AB (ref 39–117)
ALT: 20 U/L (ref 0–35)
AST: 24 U/L (ref 0–37)
Albumin: 3.4 g/dL — ABNORMAL LOW (ref 3.5–5.2)
Anion gap: 5 (ref 5–15)
BUN: 11 mg/dL (ref 6–23)
CO2: 22 mmol/L (ref 19–32)
Calcium: 8.7 mg/dL (ref 8.4–10.5)
Chloride: 107 mmol/L (ref 96–112)
Creatinine, Ser: 0.67 mg/dL (ref 0.50–1.10)
GLUCOSE: 79 mg/dL (ref 70–99)
POTASSIUM: 4.3 mmol/L (ref 3.5–5.1)
Sodium: 134 mmol/L — ABNORMAL LOW (ref 135–145)
Total Bilirubin: 0.3 mg/dL (ref 0.3–1.2)
Total Protein: 7 g/dL (ref 6.0–8.3)

## 2014-09-01 LAB — CBC
HCT: 39.1 % (ref 36.0–46.0)
HEMOGLOBIN: 13.5 g/dL (ref 12.0–15.0)
MCH: 34.4 pg — AB (ref 26.0–34.0)
MCHC: 34.5 g/dL (ref 30.0–36.0)
MCV: 99.5 fL (ref 78.0–100.0)
Platelets: 176 10*3/uL (ref 150–400)
RBC: 3.93 MIL/uL (ref 3.87–5.11)
RDW: 13.6 % (ref 11.5–15.5)
WBC: 6.8 10*3/uL (ref 4.0–10.5)

## 2014-09-01 LAB — URINALYSIS, ROUTINE W REFLEX MICROSCOPIC
BILIRUBIN URINE: NEGATIVE
Glucose, UA: NEGATIVE mg/dL
HGB URINE DIPSTICK: NEGATIVE
Ketones, ur: NEGATIVE mg/dL
Leukocytes, UA: NEGATIVE
NITRITE: NEGATIVE
PH: 6 (ref 5.0–8.0)
Protein, ur: NEGATIVE mg/dL
SPECIFIC GRAVITY, URINE: 1.01 (ref 1.005–1.030)
Urobilinogen, UA: 0.2 mg/dL (ref 0.0–1.0)

## 2014-09-01 LAB — PROTEIN / CREATININE RATIO, URINE
Creatinine, Urine: 85 mg/dL
PROTEIN CREATININE RATIO: 0.13 (ref 0.00–0.15)
TOTAL PROTEIN, URINE: 11 mg/dL

## 2014-09-01 LAB — US OB FOLLOW UP

## 2014-09-01 NOTE — Progress Notes (Signed)
I assisted Dr. Caroleen Hammanumley with questions.  Eda H Royal  Interpreter.

## 2014-09-01 NOTE — Progress Notes (Signed)
Interpreter - Regina Jones present for encounter. Pt reports having occasional headaches - none today. She denies visual changes.  Dr. Adrian BlackwaterStinson not available for consult of elevated BP at time of pt's visit - consulted with Dr. Jolayne Pantheronstant. Pt taken to [facility] for further evaluation.

## 2014-09-01 NOTE — Discharge Instructions (Signed)
Preeclampsia y eclampsia (Preeclampsia and Eclampsia) La preeclampsia es una afeccin grave que solo se manifiesta durante el embarazo. Tambin se la conoce como toxemia del embarazo. Esta afeccin provoca el aumento de la presin arterial junto con otros sntomas, como hinchazn y dolores de cabeza, que pueden aparecer a medida que la preeclampsia empeora. La preeclampsia puede presentarse a las 20semanas de gestacin o posteriormente.  El diagnstico y el tratamiento tempranos de esta afeccin son muy importantes. Si no se la trata de inmediato, puede causarles problemas graves a usted y al beb. Una complicacin que la preeclampsia puede provocar es la eclampsia, una afeccin que causa temblores o contracciones musculares (convulsiones) en la madre. Provocar el parto del beb es el mejor tratamiento para la preeclampsia o la eclampsia.  FACTORES DE RIESGO La causa de la preeclampsia no se conoce. Puede tener ms probabilidades de desarrollar la afeccin si tiene ciertos factores de riesgo. Estos incluyen:   Estar embarazada por primera vez.  Haber tenido preeclampsia en un embarazo anterior.  Tener antecedentes familiares de preeclampsia.  Tener presin arterial alta.  Estar embarazada de gemelos o trillizos.  Tener 35aos o ms.  Pertenecer a la raza afroamericana.  Tener enfermedad renal o diabetes.  Tener enfermedades, como lupus o enfermedades de la sangre.  Tener mucho sobrepeso (obesidad). SIGNOS Y SNTOMAS  Los primeros signos de preeclampsia son:  Hipertensin arterial.  Aumento de las protenas en la orina. El mdico la controlar en cada visita prenatal. Otros sntomas que pueden presentarse incluyen:   Dolor de cabeza intenso  Aumento repentino de peso.  Hinchazn de las manos, el rostro, las piernas y los pies.  Malestar estomacal (nuseas) y (vmitos).  Problemas visuales (visin doble o borrosa).  Adormecimiento del rostro, los brazos, las piernas y los  pies.  Mareos.  Hablar arrastrando las palabras.  Sensibilidad a las luces brillantes.  Dolor abdominal. DIAGNSTICO  No hay pruebas diagnsticas para la preeclampsia. El mdico le har preguntas sobre los sntomas y verificar la presencia de signos de preeclampsia durante las visitas prenatales. Tambin pueden hacerle exmenes que incluyen:  Anlisis de orina.  Anlisis de sangre.  Control de la frecuencia cardaca del beb.  Control de la salud del beb y de la placenta mediante el uso de imgenes que se generan con ondas sonoras (ecografa). TRATAMIENTO  Puede planificar el mejor abordaje de tratamiento junto con el mdico. Es muy importante que concurra a todas las visitas prenatales. Si tiene un riesgo ms alto de tener preeclampsia, tal vez necesite exmenes prenatales con ms frecuencia.  El mdico tambin puede indicarle que haga reposo en cama.  Tal vez deba comer la menor cantidad de sal posible.  Es posible que tenga que tomar medicamentos para bajar la presin arterial si la afeccin no responde a medidas ms conservadoras.  Quizs deba permanecer en el hospital si la afeccin es grave. All, el tratamiento estar centrado en el control de la presin arterial y la retencin de lquidos. Puede que tambin tenga que tomar medicamentos para evitar las convulsiones.  Si la afeccin empeora, tal vez sea necesario adelantar el parto para protegerlos a usted y al beb. Pueden provocarle el trabajo de parto con medicamentos (inducirse) o hacerle una cesrea.  Generalmente, la preeclampsia desaparece despus del parto. INSTRUCCIONES PARA EL CUIDADO EN EL HOGAR   Tome los medicamentos de venta libre o recetados solamente segn las indicaciones del mdico.  Acustese sobre el lado izquierdo mientras hace reposo; de este modo, se quita la   presin del beb.  Levante los pies mientras hace reposo.  Realice actividad fsica con regularidad. Consulte al mdico qu tipo de  ejercicio es seguro para usted.  Evite la cafena y el alcohol.  No fume.  Beba de 6 a 8vasos de agua por da.  Coma una dieta equilibrada con bajo contenido de sal. No agregue sal a las comidas.  Evite las situaciones estresantes tanto como sea posible.  Descanse y duerma lo suficiente.  Concurra a todas las visitas prenatales y hgase todos los anlisis segn lo programado. SOLICITE ATENCIN MDICA SI:  Aumenta de peso ms de lo esperado.  Tiene dolores de cabeza, dolor abdominal o nuseas.  Aparecen ms hematomas que lo normal.  Se siente mareada o tiene vahdos. SOLICITE ATENCIN MDICA DE INMEDIATO SI:   Aparece una hinchazn repentina o tiene una zona muy hinchada en cualquier parte del cuerpo. Esto suele ocurrir en las piernas.  Tiene un aumento de peso de 5libras (2,3kg) o ms en una semana.  Tiene dolor de cabeza intenso, mareos, problemas visuales o confusin.  Siente un dolor abdominal intenso.  Tiene nuseas o vmitos permanentes.  Tiene convulsiones.  Tiene dificultad para mover cualquier parte del cuerpo.  Tiene adormecimiento en el cuerpo.  Tiene dificultad para hablar.  Tiene cualquier sangrado anormal.  Siente rigidez en el cuello.  Se desmaya. ASEGRESE DE QUE:   Comprende estas instrucciones.  Controlar su afeccin.  Recibir ayuda de inmediato si no mejora o si empeora. Document Released: 04/02/2005 Document Revised: 06/28/2013 ExitCare Patient Information 2015 [employer]. This information is not intended to replace advice given to you by your health care provider. Make sure you discuss any questions you have with your health care provider.  

## 2014-09-01 NOTE — Progress Notes (Signed)
NST reactive.

## 2014-09-01 NOTE — MAU Provider Note (Signed)
History     CSN: [id number]  Arrival date and time: 09/01/14 1034   None     Chief Complaint  Patient presents with  . PIH    HPI 24yo female G2P0020 at 37weeks and 1 day presenting with elevated blood pressures. Visit done with aid of Spanish interpreter. Denies abdominal pain, headache, edema, and blurred vision. Denies previous history of elevated blood pressures. Notes contractions, however these are mild and irregular. Denies loss of fluid. Denies vaginal bleeding or discharge. Continues to note fetal movement.    Initially presented to clinic, where she complained of occasional headaches however none today. Denied visual changes. Blood pressure 123/91. BP noted to be 131/83 at previous visit on 2/22.  Urinalysis on 2/22 normal. Was sent to MAU for further evaluation.  OB History    Gravida Para Term Preterm AB TAB SAB Ectopic Multiple Living   3 0 0 0 2 0 2 0 0 0       Past Medical History  Diagnosis Date  . Hypothyroidism   . Diabetes mellitus without complication     History reviewed. No pertinent past surgical history.  Family History  Problem Relation Age of Onset  . Diabetes Mother   . Asthma Mother   . Diabetes Father     History  Substance Use Topics  . Smoking status: Never Smoker   . Smokeless tobacco: Never Used  . Alcohol Use: No    Allergies: No Known Allergies  Prescriptions prior to admission  Medication Sig Dispense Refill Last Dose  . aspirin 81 MG tablet Take 1 tablet (81 mg total) by mouth daily. 30 tablet 5 Taking  . glyBURIDE (DIABETA) 2.5 MG tablet Take 1 tablet (2.5 mg total) by mouth every evening. Take 1 tablet with dinner 90 tablet 1 Taking  . levothyroxine (SYNTHROID, LEVOTHROID) 50 MCG tablet Take 1 tablet (50 mcg total) by mouth daily before breakfast. 30 tablet 2 Taking  . metFORMIN (GLUCOPHAGE) 500 MG tablet Take 2 tablets (1,000 mg total) by mouth 2 (two) times daily with a meal. 180 tablet 1 Taking  . Prenatal Vit-Fe  Fumarate-FA (PRENATAL MULTIVITAMIN) TABS Take 1 tablet by mouth at bedtime.   Taking    Review of Systems  Eyes: Negative for blurred vision.  Cardiovascular: Negative for leg swelling.  Gastrointestinal: Negative for abdominal pain.   Physical Exam   Blood pressure 146/96, pulse 78, temperature 98.2 F (36.8 C), temperature source Oral, resp. rate 18, height 5\' 5"  (1.651 m), weight 71.668 kg (158 lb), last menstrual period 12/03/2013, unknown if currently breastfeeding.  Physical Exam  Constitutional: She appears well-developed and well-nourished. No distress.  Cardiovascular: Normal rate and regular rhythm.  Exam reveals no gallop and no friction rub.   No murmur heard. Respiratory: Effort normal and breath sounds normal. No respiratory distress. She has no wheezes. She has no rales.  GI: Soft. There is no tenderness.  Musculoskeletal: She exhibits no edema.  Fetal Monitor: baseline 140, moderate variability, no decelerations  MAU Course  Procedures  MDM Will monitor BP. Obtain Urinalysis, CBC, CMP, and protein- creatinine ratio. - Urinalysis--normal Discharge if workup normal with close follow up in clinic.   Assessment and Plan  # Gestational HTN  Araceli BoucheRumley, [place] 09/01/2014, 11:19 AM   OB fellow attestation:  I have seen and examined this patient; I agree with above documentation in the resident's note.   [facility] Angeles Shawnie DapperLopez is a 24 y.o. G3P0020 reporting from clinic for elev blood pressures +  FM, denies LOF, VB, contractions, vaginal discharge.  PE: BP 124/85 mmHg  Pulse 93  Temp(Src) 98.2 F (36.8 C) (Oral)  Resp 18  Ht  (1.651 m)  Wt 158 lb (71.668 kg)  BMI 26.29 kg/m2  LMP 12/03/2013 Gen: calm comfortable, NAD Resp: normal effort, no distress Abd: gravid DTR2+, trace LE edema  ROS, labs, PMH reviewed NST reactive   09/01/14 1230  --  93  --  --  124/85 mmHg  --  --  --  -- AG    09/01/14 1215  --  77  --  --  127/89 mmHg  --  --  --  --  AG    09/01/14 1200  --  101  --  --  129/86 mmHg  --  --  --  -- AG    09/01/14 1145  --  82  --  --  130/90 mmHg  --  --  --  -- AG    09/01/14 1052  98.2 F (36.8 C)  78  --  18  --  --  --  --  158 lb (71.668 kg) GM    09/01/14 1051  --  --  --  --  146/96 mmHg  --  --  --  -- GM         Labs: Results for orders placed or performed during the hospital encounter of 09/01/14 (from the past 24 hour(s))  Urinalysis, Routine w reflex microscopic   Collection Time: 09/01/14 10:50 AM  Result Value Ref Range   Color, Urine YELLOW YELLOW   APPearance CLEAR CLEAR   Specific Gravity, Urine 1.010 1.005 - 1.030   pH 6.0 5.0 - 8.0   Glucose, UA NEGATIVE NEGATIVE mg/dL   Hgb urine dipstick NEGATIVE NEGATIVE   Bilirubin Urine NEGATIVE NEGATIVE   Ketones, ur NEGATIVE NEGATIVE mg/dL   Protein, ur NEGATIVE NEGATIVE mg/dL   Urobilinogen, UA 0.2 0.0 - 1.0 mg/dL   Nitrite NEGATIVE NEGATIVE   Leukocytes, UA NEGATIVE NEGATIVE  Protein / creatinine ratio, urine   Collection Time: 09/01/14 10:50 AM  Result Value Ref Range   Creatinine, Urine 85.00 mg/dL   Total Protein, Urine 11 mg/dL   Protein Creatinine Ratio 0.13 0.00 - 0.15  Comprehensive metabolic panel   Collection Time: 09/01/14 12:05 PM  Result Value Ref Range   Sodium 134 (L) 135 - 145 mmol/L   Potassium 4.3 3.5 - 5.1 mmol/L   Chloride 107 96 - 112 mmol/L   CO2 22 19 - 32 mmol/L   Glucose, Bld 79 70 - 99 mg/dL   BUN 11 6 - 23 mg/dL   Creatinine, Ser 6.57 0.50 - 1.10 mg/dL   Calcium 8.7 8.4 - 84.6 mg/dL   Total Protein 7.0 6.0 - 8.3 g/dL   Albumin 3.4 (L) 3.5 - 5.2 g/dL   AST 24 0 - 37 U/L   ALT 20 0 - 35 U/L   Alkaline Phosphatase 142 (H) 39 - 117 U/L   Total Bilirubin 0.3 0.3 - 1.2 mg/dL   GFR calc non Af Amer >90 >90 mL/min   GFR calc Af Amer >90 >90 mL/min   Anion gap 5 5 - 15  CBC   Collection Time: 09/01/14 12:05 PM  Result Value Ref Range   WBC 6.8 4.0 - 10.5 K/uL   RBC 3.93 3.87 - 5.11 MIL/uL   Hemoglobin 13.5  12.0 - 15.0 g/dL   HCT 96.2 95.2 - 84.1 %   MCV 99.5  78.0 - 100.0 fL   MCH 34.4 (H) 26.0 - 34.0 pg   MCHC 34.5 30.0 - 36.0 g/dL   RDW 16.1 09.6 - 04.5 %   Platelets 176 150 - 400 K/uL    Imaging Studies:  No results found.   Plan: - fetal kick counts reinforced, labor precautions - discussed preEclampsia precautions at length - continue routine follow up in OB clinic in 1 week.  Currently does not meet criteria for gestational HTN (today is first day with elev BP, <4h apart) and last blood pressure within normal limits at 124/85.  Advised that at next clinic appt if BP elevated she will be referred for induction at that time.  Pt agreeable with plan.  Perry Mount, MD 1:06 PM

## 2014-09-01 NOTE — MAU Note (Signed)
[facility] blood pressure increased at visit today. Reports mild pian in her abdomen, denies any headache

## 2014-09-04 ENCOUNTER — Ambulatory Visit (INDEPENDENT_AMBULATORY_CARE_PROVIDER_SITE_OTHER): Payer: Self-pay | Admitting: Obstetrics & Gynecology

## 2014-09-04 VITALS — BP 131/82 | HR 76 | Wt 160.6 lb

## 2014-09-04 DIAGNOSIS — O24313 Unspecified pre-existing diabetes mellitus in pregnancy, third trimester: Secondary | ICD-10-CM

## 2014-09-04 DIAGNOSIS — O24913 Unspecified diabetes mellitus in pregnancy, third trimester: Secondary | ICD-10-CM

## 2014-09-04 NOTE — Progress Notes (Signed)
NST reactive. FBS 68-90 and PP <130. Labor precautions given

## 2014-09-04 NOTE — Progress Notes (Signed)
Interpreter - Viviana SimplerAlis Herrera present for encounter. US for growth scheduled 09/08/14.  Pt had MAU evaluation on 2/26 for elevated BP - labs negative for pre-eclampsia.  IOL scheduled 3/10 @ 0630

## 2014-09-04 NOTE — Patient Instructions (Signed)
Parto vaginal (Vaginal Delivery) Durante el parto, el mdico la ayudar a dar a luz a su beb. En elparto vaginal, deber pujar para que el beb salga por la vagina. Sin embargo, antes de que pueda sacar al beb, es necesario que ocurran ciertas cosas. La abertura del tero (cuello del tero) tiene que ablandarse, hacerse ms delgado y abrirse (dilatar) hasta que llegue a 10 cm. Adems, el beb tiene que bajar desde el tero a la vagina. SIGNOS DE TRABAJO DE PARTO  El mdico tendr primero que asegurarse de que usted est en trabajo de parto. Algunos signos son:   Eliminar lo que se llama tapn mucoso antes del inicio del trabajo de parto. Este es una pequea cantidad de mucosidad teida con sangre.  Tener contracciones uterinas regulares y dolorosas.   El tiempo entre las contracciones debe acortarse  Las molestias y el dolor se harn ms intensos gradualmente.  El dolor de las contracciones empeora al caminar y no se alivia con el reposo.   El cuello del tero se hace mas delgado (se borra) y se dilata. ANTES DEL PARTO Una vez que se inicie el trabajo de parto y sea admitida en el hospital o sanatorio, el mdico podr hacer lo siguiente:   Realizar un examen fsico.  Controlar si hay complicaciones relacionadas con el trabajo de parto.  Verificar su presin arterial, temperatura y pulso y la frecuencia cardaca (signos vitales).   Determinar si se ha roto el saco amnitico y cundo ha ocurrido.  Realizar un examen vaginal (utilizando un guante estril y un lubricante) para determinar:  La posicin (presentacin) del beb. El beb se presenta con la cabeza primero (vertex) en el canal de parto (vagina), o estn los pies o las nalgas primero (de nalgas)?  El nivel (estacin) de la cabeza del beb dentro del canal de parto.  El borramiento y la dilatacin del cuello uterino  El monitor fetal electrnico generalmente se coloca sobre el abdomen al llegar. Se utiliza para  controlar las contracciones y la frecuencia cardaca del beb.  Cuando el monitor est en el abdomen (monitor fetal externo), slo toma la frecuencia y la duracin de las contracciones. No informa acerca de la intensidad de las contracciones.  Si el mdico necesita saber exactamente la intensidad de las contracciones o cul es la frecuencia cardaca del beb, colocar un monitor interno en la vagina y el tero. El mdico comentar los riesgos y los beneficios de usar un monitor interno y le pedir autorizacin antes de colocar el dispositivo.  El monitoreo fetal continuo ser necesario si le han aplicado una epidural, si le administran ciertos medicamentos (como oxitocina) y si tiene complicaciones del embarazo o del trabajo de parto.  Podrn colocarle una va intravenosa en una vena del brazo para suministrarle lquidos y medicamentos, si es necesario. TRES ETAPAS DEL TRABAJO DE PARTO Y EL PARTO El trabajo de parto y el parto normales se dividen en tres etapas. Primera etapa Esta etapa comienza cuando comienzan las contracciones regulares y el cuello comienza a borrarse y dilatarse. Finaliza cuando el cuello est completamente abierto (completamente dilatado). La primera etapa es la etapa ms larga del trabajo de parto y puede durar desde 3 horas a 15 horas.  Algunos mtodos estn disponibles para ayudar con el dolor del parto. Usted y su mdico decidirn qu opcin es la mejor para usted. Las opciones incluyen:   Medicamentos narcticos. Estos son medicamentos fuertes que usted puede recibir a travs de una va intravenosa o   como inyeccin en el msculo. Estos medicamentos alivian el dolor pero no hacen que desaparezca completamente.  Epidural. Se administra un medicamento a travs de un tubo delgado que se inserta en la espalda. El medicamento adormece la parte inferior del cuerpo y evita el dolor en esa zona.  Bloqueo paracervical Es una inyeccin de un anestsico en cada lado del cuello  uterino.  Usted podr pedir un parto natural, que implica que no se usen analgsicos ni epidural durante el parto y el trabajo de parto. En cambio, podr tener otro tipo de ayuda como ejercicios respiratorios para hacer frente al dolor. Segunda etapa La segunda etapa del trabajo de parto comienza cuando el cuello se ha dilatado completamente a 10 cm. Contina hasta que usted puja al beb hacia abajo, por el canal de parto, y el beb nace. Esta etapa puede durar slo algunos minutos o algunas horas.  La posicin del la cabeza del beb a medida que pasa por el canal de parto, es informada como un nmero, llamado estacin. Si la cabeza del beb no ha iniciado su descenso, la estacin se describe como que est en menos 3 (-3). Cuando la cabeza del beb est en la estacin cero, est en el medio del canal de parto y se encaja en la pelvis. La estacin en la que se encuentra el beb indica el progreso de la segunda etapa del trabajo de parto.  Cuando el beb nace, el mdico lo sostendr con la cabeza hacia abajo para evitar que el lquido amnitico, el moco y la sangre entren en los pulmones del beb. La boca y la nariz del beb podrn ser succionadas con un pequeo bulbo para retirar todo lquido adicional.  El mdico podr colocar al beb sobre su estmago. Es importante evitar que el beb tome fro. Para hacerlo, el mdico secar al beb, lo colocar directamente sobre su piel, (sin mantas entre usted y el beb) y lo cubrir con mantas secas y tibias.  Se corta el cordn umbilical. Tercera etapa Durante la tercera etapa del trabajo de parto, el mdico sacar la placenta (alumbramiento) y se asegurar de que el sangrado est controlado. La salida de la placenta generalmente demora 5 minutos pero puede tardar hasta 30 minutos. Luego de la salida de la placenta, le darn un medicamento por va intravenosa o inyectable para ayudar a contraer el tero y controlar el sangrado. Si planea amamantar al beb,  puede intentar en este momento Luego de la salida de la placenta, el tero debe contraerse y quedar muy firme. Si el tero no queda firme, el mdico lo masajear. Esto es importante debido a que la contraccin del tero ayuda a cortar el sangrado en el sitio en que la placenta estaba unida al tero. Si el tero no se contrae adecuadamente ni permanece firme, podr causar un sangrado abundante. Si hay mucho sangrado, podrn darle medicamentos para contraer el tero y detener el sangrado.  Document Released: 06/05/2008 Document Revised: 11/07/2013 Exit[employer] Patient Information 2015 [employer]. This information is not intended to replace advice given to you by your health care provider. Make sure you discuss any questions you have with your health care provider.  

## 2014-09-05 ENCOUNTER — Inpatient Hospital Stay (HOSPITAL_COMMUNITY)
Admission: AD | Admit: 2014-09-05 | Discharge: 2014-09-05 | Disposition: A | Discharge status: Home / Self Care | Payer: Self-pay | Source: Ambulatory Visit | Attending: Family Medicine | Admitting: Family Medicine

## 2014-09-05 ENCOUNTER — Encounter (HOSPITAL_COMMUNITY): Payer: Self-pay | Admitting: *Deleted

## 2014-09-05 DIAGNOSIS — Z3A37 37 weeks gestation of pregnancy: Secondary | ICD-10-CM

## 2014-09-05 DIAGNOSIS — O133 Gestational [pregnancy-induced] hypertension without significant proteinuria, third trimester: Secondary | ICD-10-CM

## 2014-09-05 DIAGNOSIS — O9989 Other specified diseases and conditions complicating pregnancy, childbirth and the puerperium: Secondary | ICD-10-CM | POA: Insufficient documentation

## 2014-09-05 DIAGNOSIS — N898 Other specified noninflammatory disorders of vagina: Secondary | ICD-10-CM | POA: Insufficient documentation

## 2014-09-05 LAB — COMPREHENSIVE METABOLIC PANEL
ALT: 20 U/L (ref 0–35)
AST: 27 U/L (ref 0–37)
Albumin: 3.1 g/dL — ABNORMAL LOW (ref 3.5–5.2)
Alkaline Phosphatase: 139 U/L — ABNORMAL HIGH (ref 39–117)
Anion gap: 7 (ref 5–15)
BUN: 14 mg/dL (ref 6–23)
CALCIUM: 8.9 mg/dL (ref 8.4–10.5)
CO2: 21 mmol/L (ref 19–32)
CREATININE: 0.65 mg/dL (ref 0.50–1.10)
Chloride: 109 mmol/L (ref 96–112)
Glucose, Bld: 76 mg/dL (ref 70–99)
Potassium: 4.2 mmol/L (ref 3.5–5.1)
Sodium: 137 mmol/L (ref 135–145)
Total Bilirubin: 0.4 mg/dL (ref 0.3–1.2)
Total Protein: 6.8 g/dL (ref 6.0–8.3)

## 2014-09-05 LAB — URINALYSIS, ROUTINE W REFLEX MICROSCOPIC
Bilirubin Urine: NEGATIVE
Glucose, UA: NEGATIVE mg/dL
Hgb urine dipstick: NEGATIVE
Ketones, ur: NEGATIVE mg/dL
Leukocytes, UA: NEGATIVE
NITRITE: NEGATIVE
Protein, ur: NEGATIVE mg/dL
Specific Gravity, Urine: 1.015 (ref 1.005–1.030)
UROBILINOGEN UA: 0.2 mg/dL (ref 0.0–1.0)
pH: 6.5 (ref 5.0–8.0)

## 2014-09-05 LAB — PROTEIN / CREATININE RATIO, URINE
Creatinine, Urine: 106 mg/dL
PROTEIN CREATININE RATIO: 0.17 — AB (ref 0.00–0.15)
Total Protein, Urine: 18 mg/dL

## 2014-09-05 LAB — CBC
HEMATOCRIT: 39.7 % (ref 36.0–46.0)
Hemoglobin: 13.7 g/dL (ref 12.0–15.0)
MCH: 34.4 pg — AB (ref 26.0–34.0)
MCHC: 34.5 g/dL (ref 30.0–36.0)
MCV: 99.7 fL (ref 78.0–100.0)
Platelets: 164 10*3/uL (ref 150–400)
RBC: 3.98 MIL/uL (ref 3.87–5.11)
RDW: 13.5 % (ref 11.5–15.5)
WBC: 7 10*3/uL (ref 4.0–10.5)

## 2014-09-05 NOTE — Progress Notes (Signed)
Artelia LarocheM Williams CNM iniformed that results are back on pt

## 2014-09-05 NOTE — MAU Note (Signed)
Gush of clear fluid one hour ago, not sure if she is still leaking now.  Denies bleeding, states she has mild lower abd pain.

## 2014-09-05 NOTE — Progress Notes (Signed)
I assisted Airline pilotBelinda RN  With some information and recommendation to go home , by CiscoViria Alvarez Spanish Interpreter.

## 2014-09-05 NOTE — Discharge Instructions (Signed)
Hipertensión durante el embarazo °(Hypertension During Pregnancy) °La hipertensión también se denomina presión arterial alta. La presión arterial hace que se mueva la sangre en el cuerpo. A veces, la fuerza que mueve la sangre es demasiado intensa. Cuando está embarazada, esta afección se debe controlar atentamente. Puede causar problemas para usted y su bebé. °CUIDADOS EN EL HOGAR  °· Cumpla con todos los controles médicos. °· Tome los medicamentos como le indicó su médico. Informe a su médico sobre todos los medicamentos que toma. °· Coma muy poca sal. °· Haga ejercicios regularmente. °· No beba alcohol. °· No fume. °· No tome bebidas con cafeína. °· Acuéstese sobre el lado izquierdo cuando haga reposo. °· Su médico puede recomendarle que tome una aspirina de dosis baja (81 mg) cada día. °SOLICITE AYUDA DE INMEDIATO SI: °· Siente un dolor intenso en el vientre (abdominal). °· Nota una hinchazón repentina en las manos, los tobillos o la cara. °· Aumentó 4 libras (1,8 kg) o más en 1 semana. °· Vomita) repetidas veces. °· Tiene una hemorragia por la vagina. °· No siente los movimientos del bebé. °· Tiene cefalea. °· Tiene visión doble o borrosa. °· Tiene calambres o espasmos musculares. °· Le falta el aire. °· Tiene las yemas de los dedos y los labios azules. °· Observa sangre en la orina. °ASEGÚRESE DE QUE: °· Comprende estas instrucciones. °· Controlará su afección. °· Recibirá ayuda de inmediato si no mejora o si empeora. °Document Released: 10/08/2010 Document Revised: 11/07/2013 °ExitCare® Patient Information ©2015 [employer]. This information is not intended to replace advice given to you by your health care provider. Make sure you discuss any questions you have with your health care provider. ° ° °

## 2014-09-05 NOTE — MAU Note (Signed)
Pt states she had some leaking of fluid x 1 at 1435. Pt  States her  blood pressure has been elevated since last Friday. Pt is rating her contractions a 1

## 2014-09-05 NOTE — MAU Provider Note (Signed)
History     CSN: [id number]  Arrival date and time: 09/05/14 1539   None     Chief Complaint  Patient presents with  . Rupture of Membranes   HPI This is a 24 y.o. female at [redacted]w[redacted]d who presents with c/o leaking of fluid once this afternoon. Has had some new hypertension, denies headache, visual changes or abdominal pain at this time.  RN Note: Pt states she had some leaking of fluid x 1 at 1435. Pt States her blood pressure has been elevated since last Friday. Pt is rating her contractions a 1        OB History    Gravida Para Term Preterm AB TAB SAB Ectopic Multiple Living   0 2 0 2 0 0 1      Past Medical History  Diagnosis Date  . Hypothyroidism   . Diabetes mellitus without complication     History reviewed. No pertinent past surgical history.  Family History  Problem Relation Age of Onset  . Diabetes Mother   . Asthma Mother   . Diabetes Father     History  Substance Use Topics  . Smoking status: Never Smoker   . Smokeless tobacco: Never Used  . Alcohol Use: No    Allergies: No Known Allergies  Prescriptions prior to admission  Medication Sig Dispense Refill Last Dose  . aspirin 81 MG tablet Take 1 tablet (81 mg total) by mouth daily. 30 tablet 5 09/04/2014 at Unknown time  . glyBURIDE (DIABETA) 2.5 MG tablet Take 1 tablet (2.5 mg total) by mouth every evening. Take 1 tablet with dinner 90 tablet 1 09/04/2014 at Unknown time  . levothyroxine (SYNTHROID, LEVOTHROID) 50 MCG tablet Take 1 tablet (50 mcg total) by mouth daily before breakfast. 30 tablet 2 09/04/2014 at Unknown time  . metFORMIN (GLUCOPHAGE) 500 MG tablet Take 2 tablets (1,000 mg total) by mouth 2 (two) times daily with a meal. 180 tablet 1 09/05/2014 at Unknown time  . Prenatal Vit-Fe Fumarate-FA (PRENATAL MULTIVITAMIN) TABS Take 1 tablet by mouth at bedtime.   09/04/2014 at Unknown time    Review of Systems  Constitutional: Negative for fever, chills and malaise/fatigue.  Eyes:  Negative for blurred vision and double vision.  Gastrointestinal: Negative for nausea, vomiting and abdominal pain.  Genitourinary:       Leaking fluid   Neurological: Negative for headaches.   Physical Exam   Blood pressure 155/97, pulse 69, temperature 98.7 F (37.1 C), temperature source Oral, resp. rate 18, last menstrual period 12/03/2013, SpO2 100 %, unknown if currently breastfeeding.  Physical Exam  Constitutional: She is oriented to person, place, and time. She appears well-developed and well-nourished. No distress.  HENT:  Head: Normocephalic.  Cardiovascular: Normal rate.   Respiratory: Effort normal.  GI: Soft. She exhibits no distension. There is no tenderness. There is no rebound and no guarding.  Genitourinary: Vaginal discharge (creamy white, no pooling, no ferning) found.  Musculoskeletal: She exhibits edema (trace).  Neurological: She is alert and oriented to person, place, and time.  Skin: Skin is warm and dry.  Psychiatric: She has a normal mood and affect.  cervix 1/50/-2  FHR reactive, + accels Category I Occasional contractions  MAU Course  Procedures  MDM Results for ANNETA, ROUNDS (MRN [id number]) as of 09/09/2014 12:16  Ref. Range 09/05/2014 16:55  Sodium Latest Range: 135-145 mmol/L 137  Potassium Latest Range: 3.5-5.1 mmol/L 4.2  Chloride Latest Range: 96-112 mmol/L 109  CO2 Latest Range: 19-32 mmol/L 21  BUN Latest Range: 6-23 mg/dL 14  Creatinine Latest Range: 0.50-1.10 mg/dL 0.980.65  Calcium Latest Range: 8.4-10.5 mg/dL 8.9  GFR calc non Af Amer Latest Range: >90 mL/min >90  GFR calc Af Amer Latest Range: >90 mL/min >90  Glucose Latest Range: 70-99 mg/dL 76  Anion gap Latest Range: 5-15  7  Alkaline Phosphatase Latest Range: 39-117 U/L 139 (H)  Albumin Latest Range: 3.5-5.2 g/dL 3.1 (L)  AST Latest Range: 0-37 U/L 27  ALT Latest Range: 0-35 U/L 20  Total Protein Latest Range: 6.0-8.3 g/dL 6.8  Total Bilirubin Latest Range: 0.3-1.2  mg/dL 0.4  Results for Blake DivineNGELES LOPEZ, Tawona MARIA (MRN [id number]) as of 09/09/2014 12:16  Ref. Range 09/05/2014 16:55  WBC Latest Range: 4.0-10.5 K/uL 7.0  RBC Latest Range: 3.87-5.11 MIL/uL 3.98  Hemoglobin Latest Range: 12.0-15.0 g/dL 56.213.7  HCT Latest Range: 36.0-46.0 % 39.7  MCV Latest Range: 78.0-100.0 fL 99.7  MCH Latest Range: 26.0-34.0 pg 34.4 (H)  MCHC Latest Range: 30.0-36.0 g/dL 13.034.5  RDW Latest Range: 11.5-15.5 % 13.5  Platelets Latest Range: 150-400 K/uL 164  Results for Blake DivineNGELES LOPEZ, Ilea MARIA (MRN [id number]) as of 09/09/2014 12:16  Ref. Range 09/05/2014 15:55  Protein Creatinine Ratio Latest Range: 0.00-0.15  0.17 (H)    Assessment and Plan  A:  SIUP at 37.5 weeks      Vaginal discharge, no evidence of SROM       Gestational hypertension, no evidence of preeclampsia  P:  Discussed with Dr Adrian BlackwaterStinson      Will discharge home with Preeclampsia precautions       Labor precautions       Follow up in clinic  [facility] System[facility]ILLIAMS,Jonette Wassel 09/05/2014, 5:11 PM

## 2014-09-08 ENCOUNTER — Ambulatory Visit (HOSPITAL_COMMUNITY)
Admission: RE | Admit: 2014-09-08 | Discharge: 2014-09-08 | Disposition: A | Discharge status: Home / Self Care | Payer: Self-pay | Source: Ambulatory Visit | Attending: Obstetrics and Gynecology | Admitting: Obstetrics and Gynecology

## 2014-09-08 ENCOUNTER — Encounter (HOSPITAL_COMMUNITY): Payer: Self-pay | Admitting: General Practice

## 2014-09-08 ENCOUNTER — Inpatient Hospital Stay (HOSPITAL_COMMUNITY): Payer: Medicaid Other | Admitting: Anesthesiology

## 2014-09-08 ENCOUNTER — Ambulatory Visit (INDEPENDENT_AMBULATORY_CARE_PROVIDER_SITE_OTHER): Payer: Self-pay | Admitting: *Deleted

## 2014-09-08 ENCOUNTER — Inpatient Hospital Stay (HOSPITAL_COMMUNITY)
Admission: AD | Admit: 2014-09-08 | Discharge: 2014-09-10 | DRG: 774 | Disposition: A | Discharge status: Home / Self Care | Payer: Medicaid Other | Source: Ambulatory Visit | Attending: Obstetrics & Gynecology | Admitting: Obstetrics & Gynecology

## 2014-09-08 VITALS — BP 127/95 | HR 69

## 2014-09-08 DIAGNOSIS — O24313 Unspecified pre-existing diabetes mellitus in pregnancy, third trimester: Secondary | ICD-10-CM

## 2014-09-08 DIAGNOSIS — Z3A38 38 weeks gestation of pregnancy: Secondary | ICD-10-CM | POA: Diagnosis present

## 2014-09-08 DIAGNOSIS — O139 Gestational [pregnancy-induced] hypertension without significant proteinuria, unspecified trimester: Secondary | ICD-10-CM | POA: Diagnosis present

## 2014-09-08 DIAGNOSIS — O1493 Unspecified pre-eclampsia, third trimester: Secondary | ICD-10-CM | POA: Diagnosis present

## 2014-09-08 DIAGNOSIS — O24913 Unspecified diabetes mellitus in pregnancy, third trimester: Secondary | ICD-10-CM

## 2014-09-08 DIAGNOSIS — E119 Type 2 diabetes mellitus without complications: Secondary | ICD-10-CM | POA: Diagnosis present

## 2014-09-08 DIAGNOSIS — O2492 Unspecified diabetes mellitus in childbirth: Secondary | ICD-10-CM | POA: Diagnosis present

## 2014-09-08 DIAGNOSIS — Z3A39 39 weeks gestation of pregnancy: Secondary | ICD-10-CM | POA: Insufficient documentation

## 2014-09-08 DIAGNOSIS — E039 Hypothyroidism, unspecified: Secondary | ICD-10-CM

## 2014-09-08 DIAGNOSIS — O133 Gestational [pregnancy-induced] hypertension without significant proteinuria, third trimester: Secondary | ICD-10-CM | POA: Diagnosis present

## 2014-09-08 DIAGNOSIS — O99283 Endocrine, nutritional and metabolic diseases complicating pregnancy, third trimester: Secondary | ICD-10-CM

## 2014-09-08 DIAGNOSIS — O2412 Pre-existing diabetes mellitus, type 2, in childbirth: Secondary | ICD-10-CM | POA: Diagnosis present

## 2014-09-08 HISTORY — DX: Gestational (pregnancy-induced) hypertension without significant proteinuria, unspecified trimester: O13.9

## 2014-09-08 LAB — CBC
HCT: 44.8 % (ref 36.0–46.0)
HCT: 40.7 % (ref 36.0–46.0)
HEMOGLOBIN: 15.2 g/dL — AB (ref 12.0–15.0)
Hemoglobin: 14.1 g/dL (ref 12.0–15.0)
MCH: 34.1 pg — ABNORMAL HIGH (ref 26.0–34.0)
MCH: 34.5 pg — ABNORMAL HIGH (ref 26.0–34.0)
MCHC: 33.9 g/dL (ref 30.0–36.0)
MCHC: 34.6 g/dL (ref 30.0–36.0)
MCV: 100.4 fL — ABNORMAL HIGH (ref 78.0–100.0)
MCV: 99.5 fL (ref 78.0–100.0)
PLATELETS: 161 10*3/uL (ref 150–400)
Platelets: 173 10*3/uL (ref 150–400)
RBC: 4.46 MIL/uL (ref 3.87–5.11)
RBC: 4.09 MIL/uL (ref 3.87–5.11)
RDW: 13.6 % (ref 11.5–15.5)
RDW: 13.6 % (ref 11.5–15.5)
WBC: 9 10*3/uL (ref 4.0–10.5)
WBC: 11.1 10*3/uL — ABNORMAL HIGH (ref 4.0–10.5)

## 2014-09-08 LAB — PROTEIN / CREATININE RATIO, URINE: Creatinine, Urine: 23 mg/dL

## 2014-09-08 LAB — TYPE AND SCREEN
ABO/RH(D): O POS
Antibody Screen: NEGATIVE

## 2014-09-08 LAB — URINALYSIS, ROUTINE W REFLEX MICROSCOPIC
Bilirubin Urine: NEGATIVE
GLUCOSE, UA: NEGATIVE mg/dL
Hgb urine dipstick: NEGATIVE
Ketones, ur: NEGATIVE mg/dL
Leukocytes, UA: NEGATIVE
Nitrite: NEGATIVE
Protein, ur: NEGATIVE mg/dL
UROBILINOGEN UA: 0.2 mg/dL (ref 0.0–1.0)
pH: 6 (ref 5.0–8.0)

## 2014-09-08 LAB — OB RESULTS CONSOLE GBS: GBS: NEGATIVE

## 2014-09-08 LAB — COMPREHENSIVE METABOLIC PANEL
ALBUMIN: 3.3 g/dL — AB (ref 3.5–5.2)
ALT: 20 U/L (ref 0–35)
ANION GAP: 10 (ref 5–15)
AST: 25 U/L (ref 0–37)
Alkaline Phosphatase: 147 U/L — ABNORMAL HIGH (ref 39–117)
BUN: 11 mg/dL (ref 6–23)
CO2: 22 mmol/L (ref 19–32)
CREATININE: 0.63 mg/dL (ref 0.50–1.10)
Calcium: 8.8 mg/dL (ref 8.4–10.5)
Chloride: 104 mmol/L (ref 96–112)
GFR calc Af Amer: >90 mL/min (ref >90)
GFR calc non Af Amer: >90 mL/min (ref >90)
Glucose, Bld: 85 mg/dL (ref 70–99)
Potassium: 4.4 mmol/L (ref 3.5–5.1)
Sodium: 136 mmol/L (ref 135–145)
Total Bilirubin: 0.2 mg/dL — ABNORMAL LOW (ref 0.3–1.2)
Total Protein: 6.7 g/dL (ref 6.0–8.3)

## 2014-09-08 LAB — GROUP B STREP BY PCR: Group B strep by PCR: NEGATIVE

## 2014-09-08 LAB — GLUCOSE, CAPILLARY
GLUCOSE-CAPILLARY: 76 mg/dL (ref 70–99)
Glucose-Capillary: 82 mg/dL (ref 70–99)

## 2014-09-08 IMAGING — US US OB FOLLOW-UP
1 series · 12 of 28 positions shown · non-contrast
Comparison: none

[Series 1: us ob follow up · 12 of 40 slices shown]
[im 2/40]
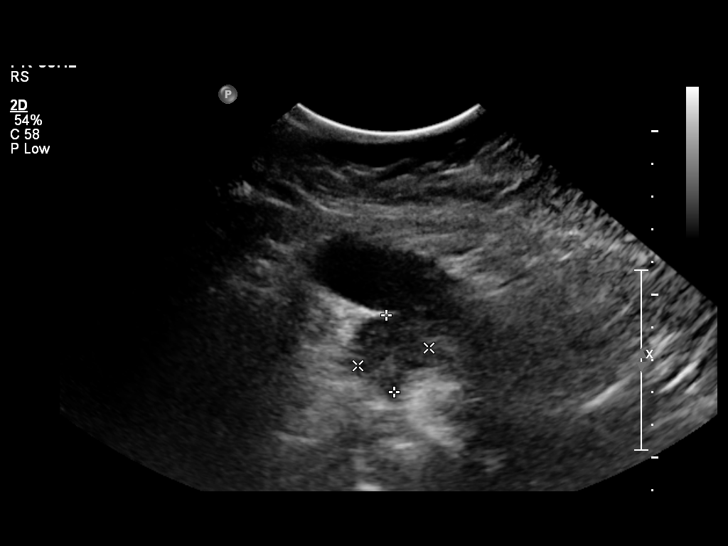
[im 5/40]
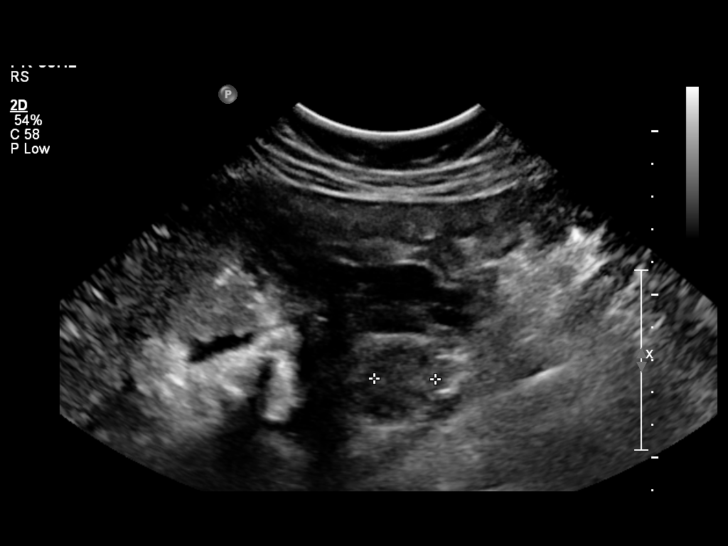
[im 8/40]
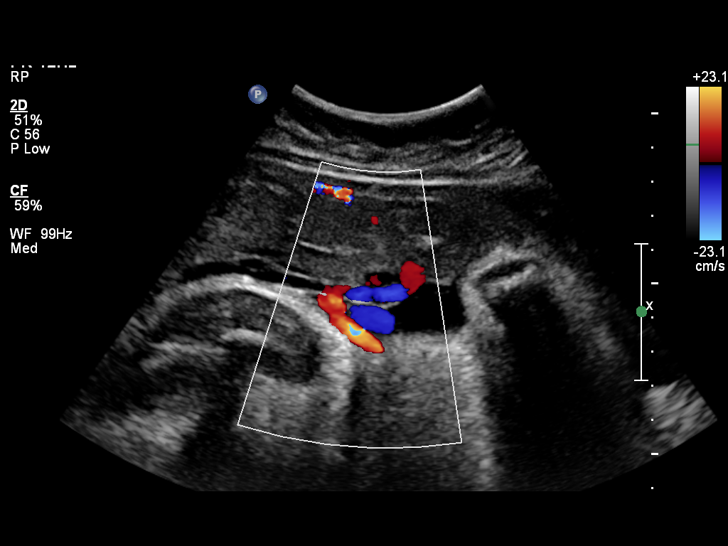
[im 12/40]
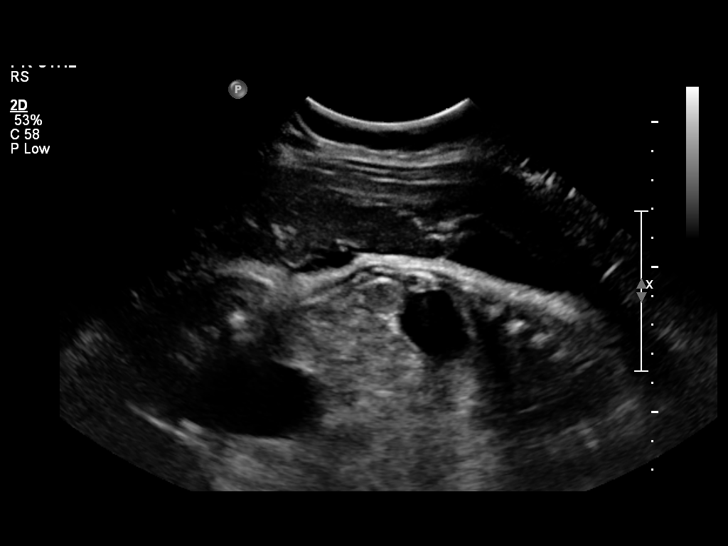
[im 15/40]
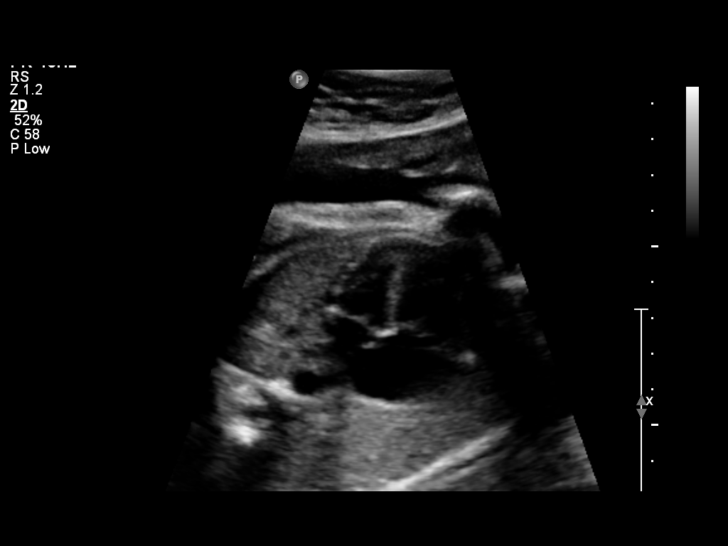
[im 18/40]
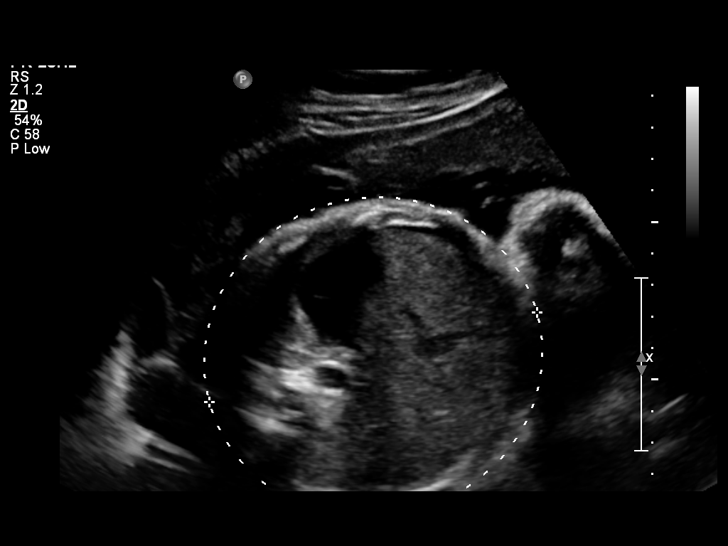
[im 22/40]
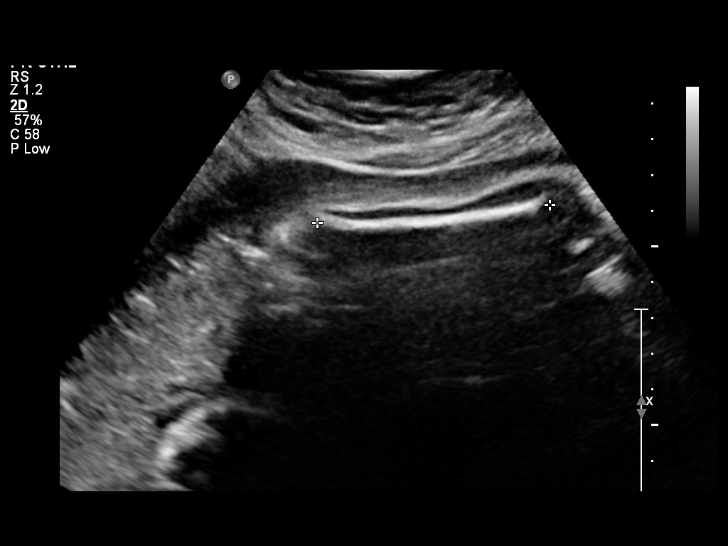
[im 25/40]
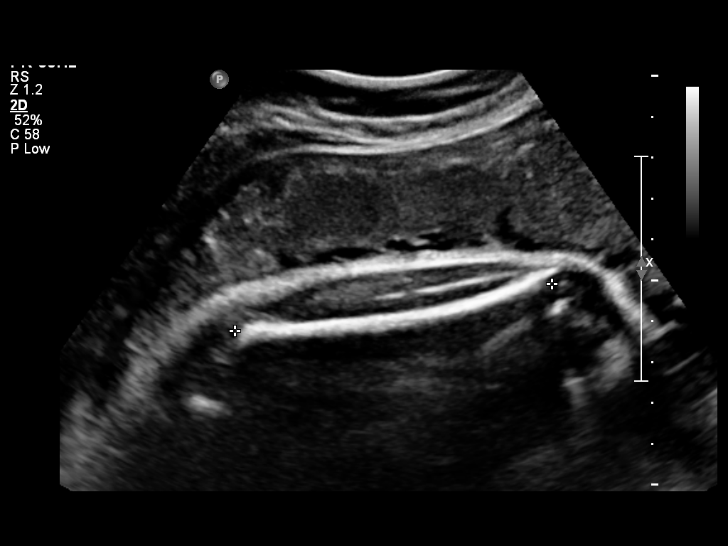
[im 28/40]
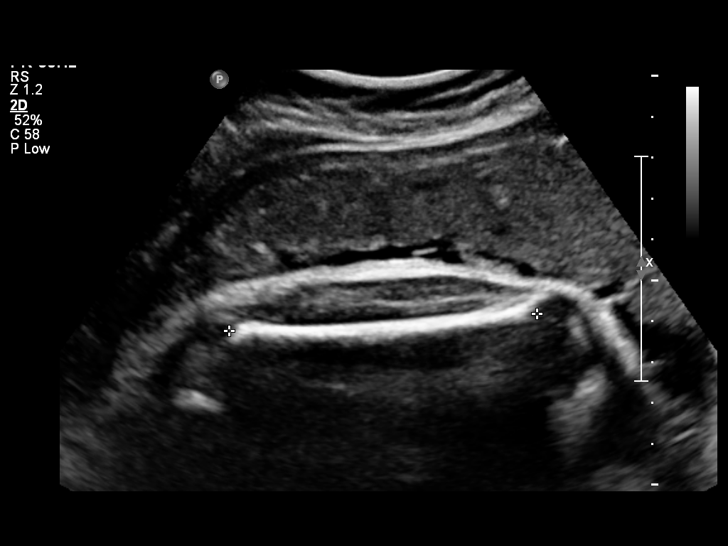
[im 32/40]
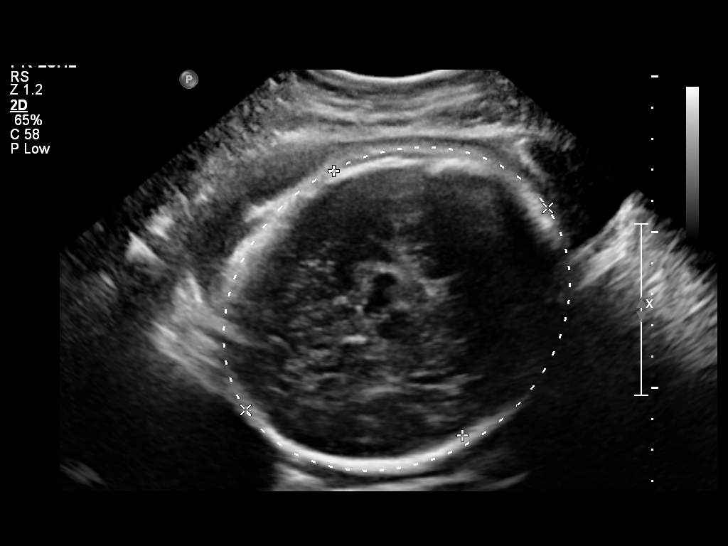
[im 35/40]
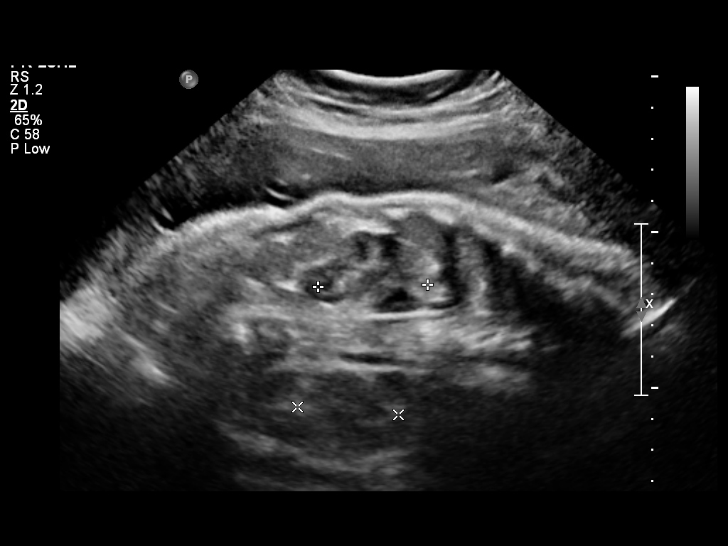
[im 38/40]
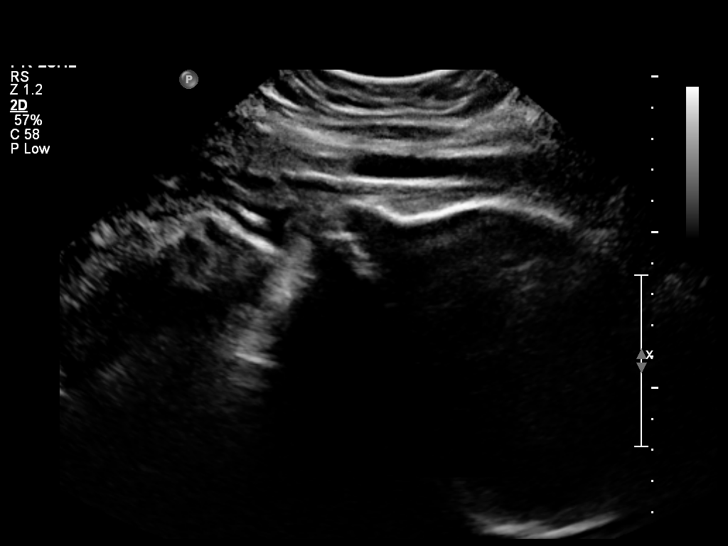

[12 of 28 positions shown; findings below may reference images not displayed]

OBSTETRICS REPORT
                      (Signed Final 09/08/2014 [DATE])

             DEEQA RAYAAN

Service(s) Provided

 US OB FOLLOW UP                                       76816.1
Indications

 39 weeks gestation of pregnancy
 Hypothyroid w/meds
 Diabetes - Pregestational, 3rd trimester - metformin
 Hypertension - Gestational
Fetal Evaluation

 Num Of Fetuses:    1
 Fetal Heart Rate:  132                          bpm
 Cardiac Activity:  Observed
 Presentation:      Cephalic
 Placenta:          Anterior, above cervical os
 P. Cord            Visualized
 Insertion:

 Amniotic Fluid
 AFI FV:      Subjectively within normal limits
 AFI Sum:     13.83   cm       57  %Tile     Larg Pckt:    5.88  cm
 RUQ:   3       cm   RLQ:    1.93   cm    LUQ:   3.02    cm   LLQ:    5.88   cm
Biometry

 BPD:     94.1  mm     G. Age:  38w 2d                CI:        76.56   70 - 86
                                                      FL/HC:      22.2   20.7 -

 HC:     340.7  mm     G. Age:  39w 2d       32  %    HC/AC:      1.01   0.87 -

 AC:     337.3  mm     G. Age:  37w 4d       18  %    FL/BPD:     80.6   71 - 87
 FL:      75.8  mm     G. Age:  38w 6d       34  %    FL/AC:      22.5   20 - 24
 HUM:     65.5  mm     G. Age:  38w 0d       46  %

 Est. FW:    3326  gm      7 lb 8 oz     62  %
Gestational Age

 LMP:           39w 6d        Date:  12/03/13                 EDD:   09/09/14
 U/S Today:     38w 4d                                        EDD:   09/18/14
 Best:          39w 6d     Det. By:  LMP  (12/03/13)          EDD:   09/09/14
Anatomy
 Cranium:          Appears normal         Aortic Arch:      Previously seen
 Fetal Cavum:      Previously seen        Ductal Arch:      Appears normal
 Ventricles:       Appears normal         Diaphragm:        Appears normal
 Choroid Plexus:   Previously seen        Stomach:          Appears normal, left
                                                            sided
 Cerebellum:       Previously seen        Abdomen:          Previously seen
 Posterior Fossa:  Previously seen        Abdominal Wall:   Previously seen
 Nuchal Fold:      Previously seen        Cord Vessels:     Previously seen
 Face:             Orbits and profile     Kidneys:          Appear normal
                   previously seen
 Lips:             Previously seen        Bladder:          Appears normal
 Palate:           Previously seen        Spine:            Previously seen
 Heart:            Appears normal         Lower             Previously seen
                   (4CH, axis, and        Extremities:
                   situs)
 RVOT:             Previously seen        Upper             Previously seen
                                          Extremities:
 LVOT:             Appears normal

 Other:  Heels and 5th digit previously seen. Fetus appears to be a male.
Cervix Uterus Adnexa

 Cervix:       Not visualized (advanced GA >46wks)Normal
               appearance by transabdominal scan.
 Left Ovary:    Size(cm) L: 2.29 x W: 1.86 x H: 1.94  Volume(cc):
 Right Ovary:   Size(cm) L: 2.35 x W: 2.49 x H: 2.23  Volume(cc):
Impression

 SIUP at 39+6 weeks
 Cephalic presentation
 Normal interval anatomy; anatomic survey complete
 Normal amniotic fluid volume
 Appropriate interval growth with EFW at the 62nd %tile
Recommendations

 Follow-up as clinically indicated

 YOLANDE PARMER with us.  Please do not hesitate to contact

## 2014-09-08 MED ORDER — TERBUTALINE SULFATE 1 MG/ML IJ SOLN: 0.2500 mg | Freq: Once | INTRAMUSCULAR | Status: AC | PRN

## 2014-09-08 MED ORDER — LACTATED RINGERS IV SOLN
500.0000 mL | INTRAVENOUS | Status: DC | PRN
  Administered 2014-09-08: 500 mL via INTRAVENOUS

## 2014-09-08 MED ORDER — CITRIC ACID-SODIUM CITRATE 334-500 MG/5ML PO SOLN: 30.0000 mL | ORAL | Status: DC | PRN

## 2014-09-08 MED ORDER — FENTANYL 2.5 MCG/ML BUPIVACAINE 1/10 % EPIDURAL INFUSION (WH - ANES)
INTRAMUSCULAR | Status: DC | PRN
  Administered 2014-09-08: 12.5 mL/h via EPIDURAL

## 2014-09-08 MED ORDER — ONDANSETRON HCL 4 MG/2ML IJ SOLN: 4.0000 mg | Freq: Four times a day (QID) | INTRAMUSCULAR | Status: DC | PRN

## 2014-09-08 MED ORDER — LACTATED RINGERS IV SOLN: 500.0000 mL | Freq: Once | INTRAVENOUS | Status: DC

## 2014-09-08 MED ORDER — DIPHENHYDRAMINE HCL 50 MG/ML IJ SOLN: 12.5000 mg | INTRAMUSCULAR | Status: DC | PRN

## 2014-09-08 MED ORDER — LACTATED RINGERS IV SOLN
INTRAVENOUS | Status: DC
  Administered 2014-09-08 (×2): via INTRAVENOUS

## 2014-09-08 MED ORDER — SODIUM CHLORIDE 0.9 % IV SOLN: 100.0000 ug/h | INTRAVENOUS | Status: DC

## 2014-09-08 MED ORDER — FENTANYL CITRATE 0.05 MG/ML IJ SOLN: 100.0000 ug | INTRAMUSCULAR | Status: DC | PRN

## 2014-09-08 MED ORDER — LIDOCAINE HCL (PF) 1 % IJ SOLN
INTRAMUSCULAR | Status: DC | PRN
  Administered 2014-09-08 (×2): 4 mL

## 2014-09-08 MED ORDER — FENTANYL 2.5 MCG/ML BUPIVACAINE 1/10 % EPIDURAL INFUSION (WH - ANES)
14.0000 mL/h | INTRAMUSCULAR | Status: DC | PRN
Start: 2014-09-08 — End: 2014-09-09
  Administered 2014-09-08: 12 mL/h via EPIDURAL
  Filled 2014-09-08: qty 125

## 2014-09-08 MED ORDER — PHENYLEPHRINE 40 MCG/ML (10ML) SYRINGE FOR IV PUSH (FOR BLOOD PRESSURE SUPPORT)
80.0000 ug | PREFILLED_SYRINGE | INTRAVENOUS | Status: DC | PRN
  Filled 2014-09-08: qty 20

## 2014-09-08 MED ORDER — ACETAMINOPHEN 325 MG PO TABS: 650.0000 mg | ORAL_TABLET | ORAL | Status: DC | PRN

## 2014-09-08 MED ORDER — PHENYLEPHRINE 40 MCG/ML (10ML) SYRINGE FOR IV PUSH (FOR BLOOD PRESSURE SUPPORT): 80.0000 ug | PREFILLED_SYRINGE | INTRAVENOUS | Status: DC | PRN

## 2014-09-08 MED ORDER — OXYTOCIN BOLUS FROM INFUSION
500.0000 mL | INTRAVENOUS | Status: DC
  Administered 2014-09-09: 500 mL via INTRAVENOUS

## 2014-09-08 MED ORDER — OXYCODONE-ACETAMINOPHEN 5-325 MG PO TABS: 2.0000 | ORAL_TABLET | ORAL | Status: DC | PRN

## 2014-09-08 MED ORDER — OXYCODONE-ACETAMINOPHEN 5-325 MG PO TABS: 1.0000 | ORAL_TABLET | ORAL | Status: DC | PRN

## 2014-09-08 MED ORDER — LIDOCAINE HCL (PF) 1 % IJ SOLN
30.0000 mL | INTRAMUSCULAR | Status: DC | PRN
  Filled 2014-09-08: qty 30

## 2014-09-08 MED ORDER — OXYTOCIN 40 UNITS IN LACTATED RINGERS INFUSION - SIMPLE MED
62.5000 mL/h | INTRAVENOUS | Status: DC
  Filled 2014-09-08: qty 1000

## 2014-09-08 MED ORDER — EPHEDRINE 5 MG/ML INJ: 10.0000 mg | INTRAVENOUS | Status: DC | PRN

## 2014-09-08 MED ORDER — FENTANYL CITRATE 0.05 MG/ML IJ SOLN
INTRAMUSCULAR | Status: AC
  Administered 2014-09-08: 100 ug
  Filled 2014-09-08: qty 2

## 2014-09-08 MED ORDER — OXYTOCIN 40 UNITS IN LACTATED RINGERS INFUSION - SIMPLE MED
1.0000 m[IU]/min | INTRAVENOUS | Status: DC
  Administered 2014-09-08: 2 m[IU]/min via INTRAVENOUS

## 2014-09-08 MED ORDER — LEVOTHYROXINE SODIUM 50 MCG PO TABS
50.0000 ug | ORAL_TABLET | Freq: Every day | ORAL | Status: DC
  Administered 2014-09-09 – 2014-09-10 (×2): 50 ug via ORAL
  Filled 2014-09-08 (×3): qty 1

## 2014-09-08 NOTE — Progress Notes (Addendum)
[facility] is a 24 y.o. G3P0020 at 7492w1d   Subjective: Comfortable with epidural  Objective: BP 114/62 mmHg  Pulse 68  Temp(Src) 98.9 F (37.2 C) (Oral)  Resp 18  Ht 5' (1.524 m)  Wt 72.576 kg (160 lb)  BMI 31.25 kg/m2  SpO2 97%  LMP 12/03/2013      FHT:  FHR: 130-140 bpm, variability: moderate,  accelerations:  Present,  decelerations:  Absent mild variables present UC:   regular, every 3-4 minutes w/ Pitocin @ 187mu/min SVE:   10/C/+1, AROM for MSF  CBGs in 70-80s  Labs: Lab Results  Component Value Date   WBC 11.1* 09/08/2014   HGB 14.1 09/08/2014   HCT 40.7 09/08/2014   MCV 99.5 09/08/2014   PLT 161 09/08/2014    Assessment / Plan: End 1st stage gHTN w/ stable BPs  Will labor down and await urge to push, approx 1-2 hrs  Rainbow Salman CNM 09/08/2014, 10:21 PM

## 2014-09-08 NOTE — MAU Note (Signed)
Pt sent from clinic for HTN.  BP has been up for the last wek.  Pt C/O slight HA, denies visual changes of epigastric pain.

## 2014-09-08 NOTE — Progress Notes (Signed)
I assisted Diane with some questions by Orlan LeavensViria Alvarez Spanish interpreter

## 2014-09-08 NOTE — Anesthesia Procedure Notes (Signed)
Epidural Patient location during procedure: OB Start time: 09/08/2014 8:36 PM  Staffing Anesthesiologist: Felipe DroneJUDD, Edwing Figley JENNETTE Performed by: anesthesiologist   Preanesthetic Checklist Completed: patient identified, site marked, surgical consent, pre-op evaluation, timeout performed, IV checked, risks and benefits discussed and monitors and equipment checked  Epidural Patient position: sitting Prep: site prepped and draped and DuraPrep Patient monitoring: continuous pulse ox and blood pressure Approach: midline Location: L3-L4 Injection technique: LOR saline  Needle:  Needle type: Tuohy  Needle gauge: 17 G Needle length: 9 cm and 9 Needle insertion depth: 5 cm cm Catheter type: closed end flexible Catheter size: 19 Gauge Catheter at skin depth: 10 cm Test dose: negative  Assessment Events: blood not aspirated, injection not painful, no injection resistance, negative IV test and no paresthesia  Additional Notes Patient identified. Risks/Benefits/Options discussed with patient including but not limited to bleeding, infection, nerve damage, paralysis, failed block, incomplete pain control, headache, blood pressure changes, nausea, vomiting, reactions to medication both or allergic, itching and postpartum back pain. Confirmed with bedside nurse the patient's most recent platelet count. Confirmed with patient that they are not currently taking any anticoagulation, have any bleeding history or any family history of bleeding disorders. Patient expressed understanding and wished to proceed. All questions were answered. Sterile technique was used throughout the entire procedure. Please see nursing notes for vital signs. Test dose was given through epidural catheter and negative prior to continuing to dose epidural or start infusion. Warning signs of high block given to the patient including shortness of breath, tingling/numbness in hands, complete motor block, or any concerning symptoms with  instructions to call for help. Patient was given instructions on fall risk and not to get out of bed. All questions and concerns addressed with instructions to call with any issues or inadequate analgesia.

## 2014-09-08 NOTE — Progress Notes (Signed)
I assisted  Susie, RN with admission questions. Eda H Royal  Interpreter.

## 2014-09-08 NOTE — MAU Note (Signed)
Pt states she is having uc's, denies bleeding or LOF.

## 2014-09-08 NOTE — H&P (Signed)
Chief Complaint  Patient presents with  . Hypertension   HPI  Patient is 24 y.o. [id number] [redacted]w[redacted]d, admitted in active labor, found to have cervical change to 4cm in office today. Denies abdominal pain, headache, edema, and blurred vision. Notes contractions. Denies loss of fluid. Notes adequate fetal movement. BP on arrival 150/90, with BP 136/99 and 127/95 at office visit today.  +FM, denies LOF, VB, contractions, vaginal discharge.   Clinic [facility]  Dating 8 week scan  Genetic Screen Quad: NIPS: (I don't see her quad screen in the chart. Too late at 25 weeks).  Anatomic [place] Normal  GTT Preexisting DM   TDaP vaccine   Flu vaccine 2015 given.  GBS negative  Contraception   Baby Food Breast  Circumcision Undecided  Pediatrician   Support Person FOB, mother        Past Medical History  Diagnosis Date  . Hypothyroidism   . Diabetes mellitus without complication     History reviewed. No pertinent past surgical history.  Family History  Problem Relation Age of Onset  . Diabetes Mother   . Asthma Mother   . Diabetes Father     History  Substance Use Topics  . Smoking status: Never Smoker   . Smokeless tobacco: Never Used  . Alcohol Use: No    Allergies: No Known Allergies  Prescriptions prior to admission  Medication Sig Dispense Refill Last Dose  . aspirin 81 MG tablet Take 1 tablet (81 mg total) by mouth daily. 30 tablet 5 09/07/2014 at Unknown time  . glyBURIDE (DIABETA) 2.5 MG tablet Take 1 tablet (2.5 mg total) by mouth every evening. Take 1 tablet with dinner 90 tablet 1 09/07/2014 at Unknown time  . levothyroxine (SYNTHROID, LEVOTHROID) 50 MCG tablet Take 1 tablet (50 mcg total) by mouth daily before breakfast. 30 tablet 2 09/08/2014 at Unknown time  . metFORMIN (GLUCOPHAGE) 500 MG tablet Take 2 tablets (1,000 mg total) by mouth 2 (two)  times daily with a meal. 180 tablet 1 09/08/2014 at Unknown time  . Prenatal Vit-Fe Fumarate-FA (PRENATAL MULTIVITAMIN) TABS Take 1 tablet by mouth at bedtime.   09/07/2014 at Unknown time    Review of Systems  Constitutional: Negative for fever and chills.  Eyes: Negative for blurred vision.  Respiratory: Negative for cough and shortness of breath.  Cardiovascular: Negative for leg swelling.  Gastrointestinal: Negative for nausea, vomiting, abdominal pain and diarrhea.  Genitourinary: Negative for dysuria and hematuria.  Neurological: Negative for headaches.   Physical Exam   Blood pressure 121/87, pulse 73, temperature 97.9 F (36.6 C), temperature source Oral, resp. rate 20, last menstrual period 12/03/2013, unknown if currently breastfeeding.  Physical Exam  Constitutional: She appears well-developed and well-nourished. No distress.  HENT:  Head: Normocephalic and atraumatic.  Eyes: Conjunctivae are normal. Pupils are equal, round, and reactive to light.  Neck: Normal range of motion.  Cardiovascular: Normal rate.  Respiratory: Effort normal. No respiratory distress.  GI: Soft. She exhibits no distension. There is no tenderness.  Musculoskeletal: Normal range of motion. She exhibits no edema.  Neurological: She is alert. Coordination normal.  Skin: Skin is warm and dry. She is not diaphoretic.  Cervical exam: 4.5/80/-2 vertex  MAU Course  Procedures  MDM FHT reassuring  Assessment and Plan  A: Patient is 24 y.o. [id number] [redacted]w[redacted]d admitted for PIH  Contractions approx every 3-5 min  FHT with accelerations and moderate variability  Elevated BP, but UA and Pr/Cr ration wnl  P: Spontaneous labor, with  PIH diagnosis  Will augment labor with pitocin  Expectant management  Henson,Amber            OB fellow attestation:  I have seen and examined this patient; I agree with above documentation in the resident's note.   [facility]  [place] Shawnie DapperLopez is a 24 y.o. [phone] [id number] here for in active labor  PE: BP 114/80 mmHg  Pulse 90  Temp(Src) 98.4 F (36.9 C) (Oral)  Resp 18  Ht 5' (1.524 m)  Wt 160 lb (72.576 kg)  BMI 31.25 kg/m2  SpO2 99%  LMP 12/03/2013  Breastfeeding? Unknown Gen: calm comfortable, NAD Resp: normal effort, no distress Abd: gravid  ROS, labs, PMH reviewed  Plan: preE labs for elev BP q4h CBGs 2/2 BDM Breast Undecided about contraception  Saren Corkern ROCIO 09/09/2014, 5:31 PM

## 2014-09-08 NOTE — Progress Notes (Signed)
I stopped by patients room to check on her needs, I ordered her dinner and snack, by Orlan LeavensViria Alvarez

## 2014-09-08 NOTE — Progress Notes (Signed)
LABOR PROGRESS NOTE  Regina Jones is a 24 y.o. G3P0020 at 1108w1d  admitted in active labor/augmentation with concern for elevated BP.   Subjective: Resting comfortably, but with increasing lower abdominal pressure.  Objective: BP 136/89 mmHg  Pulse 68  Temp(Src) 97.9 F (36.6 C) (Oral)  Resp 18  Ht 5' (1.524 m)  Wt 72.576 kg (160 lb)  BMI 31.25 kg/m2  LMP 12/03/2013 or  Filed Vitals:   09/08/14 1530 09/08/14 1600 09/08/14 1630 09/08/14 1700  BP: 113/72 123/87 120/81 136/89  Pulse: 71 82 82 68  Temp:      TempSrc:      Resp: 18 18 18 18   Height:      Weight:        Dilation: 4.5 Effacement (%): 70, 80 Cervical Position: Middle Station: -2 Presentation: Vertex Exam by:: S Nix RN; Dr Suezanne JacquetHenson  Labs: Lab Results  Component Value Date   WBC 9.0 09/08/2014   HGB 15.2* 09/08/2014   HCT 44.8 09/08/2014   MCV 100.4* 09/08/2014   PLT 173 09/08/2014    Patient Active Problem List   Diagnosis Date Noted  . Gestational hypertension 09/08/2014  . [redacted] weeks gestation of pregnancy   . GDM (gestational diabetes mellitus)   . Hypothyroid in pregnancy, antepartum   . Preexisting diabetes complicating pregnancy, antepartum 01/19/2014  . Hypothyroidism affecting pregnancy, antepartum 01/19/2014  . Diabetes mellitus without complication   . Hypothyroidism     Assessment / Plan: 24 y.o. G3P0020 at 278w1d here in active labor/for augmentation   Labor: Progressing normally Fetal Wellbeing:  FHR 140, moderate variability, with accelerations Pain Control:  None Anticipated MOD: NSVD  Misao Fackrell, MD 09/08/2014, 5:41 PM

## 2014-09-08 NOTE — Progress Notes (Signed)
I assisted Regina SellaMary Jennette MD with some questions during the Epidural,  By Orlan LeavensViria Alvarez Spanish Interpreter

## 2014-09-08 NOTE — Anesthesia Preprocedure Evaluation (Signed)
Anesthesia Evaluation  Patient identified by MRN, date of birth, ID band Patient awake    Reviewed: Allergy & Precautions, NPO status , Patient's Chart, lab work & pertinent test results  Airway Mallampati: II  TM Distance: >3 FB Neck ROM: Full    Dental no notable dental hx. (+) Dental Advisory Given   Pulmonary neg pulmonary ROS,  breath sounds clear to auscultation  Pulmonary exam normal       Cardiovascular hypertension, Pt. on medications Rhythm:Regular Rate:Normal     Neuro/Psych negative neurological ROS  negative psych ROS   GI/Hepatic negative GI ROS, Neg liver ROS,   Endo/Other  diabetes, GestationalHypothyroidism obesity  Renal/GU negative Renal ROS  negative genitourinary   Musculoskeletal negative musculoskeletal ROS (+)   Abdominal   Peds negative pediatric ROS (+)  Hematology negative hematology ROS (+)   Anesthesia Other Findings   Reproductive/Obstetrics negative OB ROS                             Anesthesia Physical Anesthesia Plan  ASA: II  Anesthesia Plan: Epidural   Post-op Pain Management:    Induction:   Airway Management Planned:   Additional Equipment:   Intra-op Plan:   Post-operative Plan:   Informed Consent: I have reviewed the patients History and Physical, chart, labs and discussed the procedure including the risks, benefits and alternatives for the proposed anesthesia with the patient or authorized representative who has indicated his/her understanding and acceptance.   Dental advisory given  Plan Discussed with:   Anesthesia Plan Comments:         Anesthesia Quick Evaluation

## 2014-09-08 NOTE — Progress Notes (Signed)
Interpreter - Regina Jones present for encounter.  Pt denies H/A or visual disturbances. PIH labs reviewed from 3/1 - all normal.  US for growth today. Consult with Dr. Jolayne Pantheronstant re: elevated BP. Pt needs MAU evaluation after US completed. Pt advised of plan of care with [facility] from [facility].  Pt voiced understanding. Telephone report given to [facility]Judy RN @ MAU.

## 2014-09-09 ENCOUNTER — Encounter (HOSPITAL_COMMUNITY): Payer: Self-pay | Admitting: *Deleted

## 2014-09-09 DIAGNOSIS — O99284 Endocrine, nutritional and metabolic diseases complicating childbirth: Secondary | ICD-10-CM

## 2014-09-09 DIAGNOSIS — O2412 Pre-existing diabetes mellitus, type 2, in childbirth: Secondary | ICD-10-CM

## 2014-09-09 DIAGNOSIS — Z3A38 38 weeks gestation of pregnancy: Secondary | ICD-10-CM

## 2014-09-09 DIAGNOSIS — O133 Gestational [pregnancy-induced] hypertension without significant proteinuria, third trimester: Secondary | ICD-10-CM

## 2014-09-09 LAB — CBC
HCT: 31.2 % — ABNORMAL LOW (ref 36.0–46.0)
HEMOGLOBIN: 10.6 g/dL — AB (ref 12.0–15.0)
MCH: 34.2 pg — ABNORMAL HIGH (ref 26.0–34.0)
MCHC: 34.3 g/dL (ref 30.0–36.0)
MCV: 99.7 fL (ref 78.0–100.0)
PLATELETS: 143 10*3/uL — AB (ref 150–400)
RBC: 3.13 MIL/uL — AB (ref 3.87–5.11)
RDW: 13.8 % (ref 11.5–15.5)
WBC: 16.5 10*3/uL — ABNORMAL HIGH (ref 4.0–10.5)

## 2014-09-09 LAB — RPR: RPR: NONREACTIVE

## 2014-09-09 LAB — GLUCOSE, CAPILLARY
GLUCOSE-CAPILLARY: 102 mg/dL — AB (ref 70–99)
Glucose-Capillary: 89 mg/dL (ref 70–99)
Glucose-Capillary: 115 mg/dL — ABNORMAL HIGH (ref 70–99)
Glucose-Capillary: 133 mg/dL — ABNORMAL HIGH (ref 70–99)

## 2014-09-09 MED ORDER — LANOLIN HYDROUS EX OINT: TOPICAL_OINTMENT | CUTANEOUS | Status: DC | PRN

## 2014-09-09 MED ORDER — ZOLPIDEM TARTRATE 5 MG PO TABS: 5.0000 mg | ORAL_TABLET | Freq: Every evening | ORAL | Status: DC | PRN

## 2014-09-09 MED ORDER — DIBUCAINE 1 % RE OINT
1.0000 "application " | TOPICAL_OINTMENT | RECTAL | Status: DC | PRN
  Filled 2014-09-09: qty 28

## 2014-09-09 MED ORDER — PNEUMOCOCCAL VAC POLYVALENT 25 MCG/0.5ML IJ INJ
0.5000 mL | INJECTION | INTRAMUSCULAR | Status: DC
  Filled 2014-09-09: qty 0.5

## 2014-09-09 MED ORDER — OXYCODONE-ACETAMINOPHEN 5-325 MG PO TABS
2.0000 | ORAL_TABLET | ORAL | Status: DC | PRN
  Administered 2014-09-10: 2 via ORAL

## 2014-09-09 MED ORDER — IBUPROFEN 600 MG PO TABS: 600.0000 mg | ORAL_TABLET | Freq: Once | ORAL | Status: DC

## 2014-09-09 MED ORDER — BENZOCAINE-MENTHOL 20-0.5 % EX AERO
1.0000 "application " | INHALATION_SPRAY | CUTANEOUS | Status: DC | PRN
  Administered 2014-09-09: 1 via TOPICAL
  Filled 2014-09-09 (×2): qty 56

## 2014-09-09 MED ORDER — PRENATAL MULTIVITAMIN CH
1.0000 | ORAL_TABLET | Freq: Every day | ORAL | Status: DC
  Administered 2014-09-09 – 2014-09-10 (×2): 1 via ORAL
  Filled 2014-09-09 (×2): qty 1

## 2014-09-09 MED ORDER — WITCH HAZEL-GLYCERIN EX PADS: 1.0000 "application " | MEDICATED_PAD | CUTANEOUS | Status: DC | PRN

## 2014-09-09 MED ORDER — LACTATED RINGERS IV BOLUS (SEPSIS): 1000.0000 mL | Freq: Once | INTRAVENOUS | Status: DC

## 2014-09-09 MED ORDER — SIMETHICONE 80 MG PO CHEW: 80.0000 mg | CHEWABLE_TABLET | ORAL | Status: DC | PRN

## 2014-09-09 MED ORDER — TETANUS-DIPHTH-ACELL PERTUSSIS 5-2.5-18.5 LF-MCG/0.5 IM SUSP
0.5000 mL | Freq: Once | INTRAMUSCULAR | Status: AC
  Administered 2014-09-09: 0.5 mL via INTRAMUSCULAR

## 2014-09-09 MED ORDER — ONDANSETRON HCL 4 MG PO TABS: 4.0000 mg | ORAL_TABLET | ORAL | Status: DC | PRN

## 2014-09-09 MED ORDER — OXYCODONE-ACETAMINOPHEN 5-325 MG PO TABS
1.0000 | ORAL_TABLET | ORAL | Status: DC | PRN
  Administered 2014-09-09 – 2014-09-10 (×3): 1 via ORAL
  Filled 2014-09-09 (×4): qty 1

## 2014-09-09 MED ORDER — SENNOSIDES-DOCUSATE SODIUM 8.6-50 MG PO TABS
2.0000 | ORAL_TABLET | ORAL | Status: DC
  Administered 2014-09-10: 2 via ORAL
  Filled 2014-09-09: qty 2

## 2014-09-09 MED ORDER — DIPHENHYDRAMINE HCL 25 MG PO CAPS: 25.0000 mg | ORAL_CAPSULE | Freq: Four times a day (QID) | ORAL | Status: DC | PRN

## 2014-09-09 MED ORDER — IBUPROFEN 600 MG PO TABS
600.0000 mg | ORAL_TABLET | Freq: Four times a day (QID) | ORAL | Status: DC
  Administered 2014-09-09 – 2014-09-10 (×6): 600 mg via ORAL
  Filled 2014-09-09 (×6): qty 1

## 2014-09-09 MED ORDER — PNEUMOCOCCAL VAC POLYVALENT 25 MCG/0.5ML IJ INJ
0.5000 mL | INJECTION | Freq: Once | INTRAMUSCULAR | Status: AC
  Administered 2014-09-09: 0.5 mL via INTRAMUSCULAR
  Filled 2014-09-09 (×2): qty 0.5

## 2014-09-09 MED ORDER — TETANUS-DIPHTH-ACELL PERTUSSIS 5-2.5-18.5 LF-MCG/0.5 IM SUSP: 0.5000 mL | Freq: Once | INTRAMUSCULAR | Status: DC

## 2014-09-09 MED ORDER — ONDANSETRON HCL 4 MG/2ML IJ SOLN: 4.0000 mg | INTRAMUSCULAR | Status: DC | PRN

## 2014-09-09 NOTE — Progress Notes (Signed)
[facility] [place] Shawnie DapperLopez is a 24 y.o. G3P0020 at 5438w2d   Subjective: Denies urge to push. Comfortable with Epidural.  Objective: BP 104/66 mmHg  Pulse 88  Temp(Src) 98 F (36.7 C) (Oral)  Resp 18  Ht 5' (1.524 m)  Wt 72.576 kg (160 lb)  BMI 31.25 kg/m2  SpO2 97%  LMP 12/03/2013   Total I/O In: -  Out: 200 [Urine:200]  FHT:  FHR: 140 bpm, variability: moderate,  accelerations:  Present,  decelerations:  Absent UC:   Irregular SVE:   Dilation: 10 Effacement (%): 100 Station: +2 Exam by:: ansah-mensah, rnc   Labs: Lab Results  Component Value Date   WBC 11.1* 09/08/2014   HGB 14.1 09/08/2014   HCT 40.7 09/08/2014   MCV 99.5 09/08/2014   PLT 161 09/08/2014    Assessment / Plan: Induction of labor due to gestational hypertension,  progressing well on pitocin  Labor: Progressing normally. If no urge to push by 1am, will begin pushing. Preeclampsia:  no signs or symptoms of toxicity Fetal Wellbeing:  Category I Pain Control:  Epidural I/D:  n/a GBS negative Anticipated MOD:  NSVD  Araceli BoucheRumley, [place] N 09/09/2014, 12:25 AM

## 2014-09-09 NOTE — Anesthesia Postprocedure Evaluation (Signed)
Anesthesia Post Note  Patient: [facility]erna [facility]  Procedure(s) Performed: * No procedures listed *  Anesthesia type: Epidural  Patient location: Mother/Baby  Post pain: Pain level controlled  Post assessment: Post-op Vital signs reviewed  Last Vitals:  Filed Vitals:   09/09/14 0631  BP: 101/76  Pulse: 128  Temp: 36.8 C  Resp: 18    Post vital signs: Reviewed  Level of consciousness: awake  Complications: No apparent anesthesia complications

## 2014-09-09 NOTE — Lactation Note (Signed)
This note was copied from the chart of Regina Jones. Lactation Consultation Note  Offered interpreter and mother suggested family member interpret and she said she could understand english. Family member interpreted. P1, Mother has short shaft nipples.  She was able to express good drops of colostrum. Baby has recessed chin.  Prepumped w/ hand pump to evert nipple. Attempted latching sleepy baby in cross cradle and football but baby would not open to latch. Repositioned mother to side lying.  Baby latched for approx 10 min.  Sucks with a few swallows observed. Mother felt a pull and tug.  Reviewed w/ mother that baby needs to be deep on breast and not only on nipple. Family member in room asked if we could give mother a nipple shield.   Provided family with #16&#20NS.  Reviewed how to apply NS.  Attempted latching with NS.  Baby mouthed NS but would not suck. He seems like an early sleepy baby.  He already  had 7ml of formula and may be full. Encouraged mother that if she chooses to use NS to watch for colostrum in NS afterwards. Reviewed cluster feeding and supply and demand. Mom made aware of O/P services, breastfeeding support groups, community resources, and our phone # for post-discharge questions in Spanish.     [facility]: [facility] Shawnie [facility] Today's Date: 09/09/2014 Reason for consult: Initial assessment   Maternal Data    Feeding Feeding Type: Breast Fed  LATCH Score/Interventions Latch: Repeated attempts needed to sustain latch, nipple held in mouth throughout feeding, stimulation needed to elicit sucking reflex. Intervention(s): Skin to skin Intervention(s): Adjust position;Assist with latch;Breast massage  Audible Swallowing: A few with stimulation Intervention(s): Hand expression;Skin to skin Intervention(s): Alternate breast massage  Type of Nipple: Everted at rest and after stimulation  Comfort (Breast/Nipple): Soft / non-tender     Hold  (Positioning): Assistance needed to correctly position infant at breast and maintain latch.  LATCH Score: 7  Lactation Tools Discussed/Used     Consult Status Consult Status: Follow-up Date: 09/10/14 Follow-up type: In-patient    Dahlia ByesBerkelhammer, Ruth [facility] 09/09/2014, 2:24 PM

## 2014-09-09 NOTE — Progress Notes (Signed)
Checked on patients needs.  °Spanish Interpreter  °

## 2014-09-09 NOTE — Progress Notes (Signed)
Contacted by nursing concerning hematoma. Small hematoma noted on exam, unchanged from hematoma noted at delivery. Will ice as indicated and continue to monitor. Plan discussed via Spanish Interpreter.  Dr. Caroleen Hammanumley, Family Medicine, PGY-1

## 2014-09-09 NOTE — Progress Notes (Signed)
Regina Jones is a 24 y.o. G3P0020 at 1574w2d   Subjective: Has pushed x 1 hr with good movement of baby, but not feeling anything due to heavy epidural  Objective: BP 122/83 mmHg  Pulse 113  Temp(Src) 98.4 F (36.9 C) (Oral)  Resp 18  Ht 5' (1.524 m)  Wt 72.576 kg (160 lb)  BMI 31.25 kg/m2  SpO2 97%  LMP 12/03/2013   Total I/O In: -  Out: 200 [Urine:200]  FHT:  FHR: 140s bpm, variability: moderate,  accelerations:  Present,  decelerations:  Present occ variables UC:   regular, every 2 minutes SVE:   Dilation: 10 Effacement (%): 100 Station: +2 Exam by:: [email or web address]-[email or web address], rnc   Labs: Lab Results  Component Value Date   WBC 11.1* 09/08/2014   HGB 14.1 09/08/2014   HCT 40.7 09/08/2014   MCV 99.5 09/08/2014   PLT 161 09/08/2014    Assessment / Plan: End 1st stage Heavy epidural  Anesthesia to decrease dose and then will resume pushing  SHAW, KIMBERLY CNM 09/09/2014, 2:41 AM

## 2014-09-09 NOTE — Progress Notes (Signed)
Patient is Diabetes Type II.  RN called Philipp DeputyKim Shaw, CMN and verified that no  CBGs or medications will be ordered for her diabetes .

## 2014-09-09 NOTE — Progress Notes (Signed)
Checked on patient needs. Patient asked to get food earlier than had originally requested.  Called nutrition services to have meal delivered at 7:30 instead of 9:30.  Family had brought her dinner earlier. Family had brought food earlier. Spanish Interpreter

## 2014-09-09 NOTE — Progress Notes (Signed)
Checked on patient needs.  NT was bathing baby and mother had questions during the bath.  I assisted with interpreting with NT and mother.  Spanish Interpreter

## 2014-09-09 NOTE — Progress Notes (Signed)
Checked on patient and ordered patient dinner and breakfast via fax.  Spanish Interpreter

## 2014-09-10 ENCOUNTER — Ambulatory Visit: Payer: Self-pay

## 2014-09-10 LAB — GLUCOSE, CAPILLARY: Glucose-Capillary: 105 mg/dL — ABNORMAL HIGH (ref 70–99)

## 2014-09-10 NOTE — Discharge Instructions (Signed)

## 2014-09-10 NOTE — Lactation Note (Signed)
This note was copied from the [facility] Lactation Consultation Note  [facility] Shawnie DapperLopez EXBMW'UToday's Date: 09/10/2014 Reason for consult: Follow-up assessment Spanish interpreter present for visit. Mom had baby latched using #20 nipple shield but baby was not sustaining good depth. Assisted Mom with positioning and obtaining more depth. Reviewed with Mom how the latch on the nipple shield should look when baby is using properly. Advised Mom to look for colostrum in the nipple shield with feedings. Mom reports she started using nipple shield today and is observing some colostrum. Small amount of colostrum present with this visit. Mom is supplementing with formula with feedings. Advised Mom to BF each feeding using #20 nipple shield, both breasts each feeding for 15-20 minutes each breast. FOB to give supplement according to guidelines per hours of age. Mom to post pump for 15 minutes after feedings. Parents agree with plan. Encouraged to ask for assist as needed.   Maternal Data    Feeding Feeding Type: Breast Fed Length of feed: 15 min  LATCH Score/Interventions Latch: Grasps breast easily, tongue down, lips flanged, rhythmical sucking. Intervention(s): Adjust position;Assist with latch  Audible Swallowing: A few with stimulation  Type of Nipple: Everted at rest and after stimulation  Comfort (Breast/Nipple): Soft / non-tender     Hold (Positioning): Assistance needed to correctly position infant at breast and maintain latch. Intervention(s): Breastfeeding basics reviewed;Support Pillows;Position options;Skin to skin  LATCH Score: 8  Lactation Tools Discussed/Used Tools: Nipple Shields Nipple shield size: 20 Breast pump type: Manual   Consult Status Consult Status: Follow-up Date: 09/11/14 Follow-up type: In-patient    Alfred LevinsGranger, Kellis Mcadam Ann 09/10/2014, 10:33 PM

## 2014-09-10 NOTE — Progress Notes (Signed)
Assisted RN Fannie KneeSue with interpretation of baby assessment and care instructions.  Spanish Interpreter

## 2014-09-10 NOTE — Progress Notes (Signed)
Checked on patients needs.  °Spanish Interpreter  °

## 2014-09-10 NOTE — Progress Notes (Signed)
Discharge teaching discussed with interpretor.  Bottle of Ibuprofen given.

## 2014-09-10 NOTE — Discharge Summary (Signed)
Obstetric Discharge Summary Reason for Admission: induction of labor 2/2 gestational HTN Prenatal Procedures: NST Intrapartum Procedures: spontaneous vaginal delivery Postpartum Procedures: none Complications-Operative and Postpartum: 1 degree perineal laceration HEMOGLOBIN  Date Value Ref Range Status  09/09/2014 10.6* 12.0 - 15.0 g/dL Final    Comment:    DELTA CHECK NOTED REPEATED TO VERIFY    HCT  Date Value Ref Range Status  09/09/2014 31.2* 36.0 - 46.0 % Final   Doing well today, pain well controlled, difficulty with breast feeding and now only bottle feeding.  Has been evaluated by lactation.    DM: advised to stop all medications, continue with diet, f/u with PCP in 3 months  Delivery Note At 2:58 AM a healthy female was delivered via Vaginal, Spontaneous Delivery (Presentation: Middle Occiput Anterior). APGAR: 7, 9; weight: pending .  Placenta status: Intact, Spontaneous. Cord: 3 vessels with the following complications: None.   Anesthesia: Epidural  Episiotomy: None Lacerations: 1st Degree Vaginal Suture Repair: 3.0 vicryl Est. Blood Loss (mL): 400 (Suspected to be increased secondary to Aspirin)  Mom to postpartum. Baby to Couplet care / Skin to Skin.  Physical Exam:  General: alert and cooperative Lochia: appropriate Uterine Fundus: firm Incision: n/a  DVT Evaluation: No evidence of DVT seen on physical exam.  Discharge Diagnoses: Term Pregnancy-delivered, B diabetes, gestational hypertension  Discharge Information: Date: 09/10/2014 Activity: pelvic rest Diet: routine Medications: None Condition: stable Instructions: refer to practice specific booklet Discharge to: home Follow-up Information    [facility].   Specialty:  Obstetrics and Gynecology   Contact information:   [address] [place] [id number] [phone]      Follow up with your regular doctor In 3 months.       Newborn Data: Live born female  Birth Weight: 6 lb 10.2 oz (3011 g) APGAR: 7, 9  Home with mother.  Regina Jones ROCIO 09/10/2014, 5:58 AM

## 2014-09-10 NOTE — Progress Notes (Signed)
Assisted RN and Dr Margo AyeHall with interpretation of assessment.  Spanish Interpreter

## 2014-09-10 NOTE — Progress Notes (Signed)
Assisted RN Fannie KneeSue with interpretation of assessment.  Spanish Interpreter

## 2014-09-10 NOTE — Progress Notes (Signed)
Assisted Lactation with breastfeeding instructions.  Spanish Interpreter

## 2014-09-11 ENCOUNTER — Ambulatory Visit: Payer: Self-pay

## 2014-09-11 ENCOUNTER — Telehealth (HOSPITAL_COMMUNITY): Payer: Self-pay | Admitting: *Deleted

## 2014-09-11 ENCOUNTER — Other Ambulatory Visit: Payer: Self-pay

## 2014-09-11 NOTE — Lactation Note (Signed)
This note was copied from the chart of [facility]. Lactation Consultation Note  Patient Name: Regina Jones Regina Jones: 09/11/2014 Reason for consult: Follow-up assessment;Hyperbilirubinemia (bilirubin dropped slightly and phototx discontinued around noon today) Mom recently fed 15 ml's of formula and is pumping with hand pump for additional stimulation but she states that baby nursed 30 minutes on each breast at 1700 feeding.  LC discussed and reviewed plan, encouraging cue feedings at breast and minimal supplement, as well as pumping for additional stimulation of milk supply if formula given.  Parents able to speak a little [place] and state they understand LC speaking Spanish and reviewing feeding plan.   Maternal Data    Feeding Feeding Type: Bottle Fed - Formula Nipple Type: Slow - flow Length of feed: 60 min  LATCH Score/Interventions Latch: Grasps breast easily, tongue down, lips flanged, rhythmical sucking.  Audible Swallowing: Spontaneous and intermittent  Type of Nipple: Everted at rest and after stimulation Intervention(s): No intervention needed  Comfort (Breast/Nipple): Soft / non-tender     Hold (Positioning): No assistance needed to correctly position infant at breast.  LATCH Score: 10 (recent feeding assessment by RN staff)  Lactation Tools Discussed/Used   Cue feedings Hand pump as needed for additional stimulation  Consult Status Consult Status: Follow-up Jones: 09/12/14 Follow-up type: In-patient    Regina Jones, Regina Jones 09/11/2014, 8:54 PM

## 2014-09-11 NOTE — Telephone Encounter (Signed)
Preadmission screen delivered

## 2014-09-12 ENCOUNTER — Ambulatory Visit: Payer: Self-pay

## 2014-09-12 NOTE — Progress Notes (Signed)
Post discharge chart review completed.  

## 2014-09-12 NOTE — Lactation Note (Signed)
This note was copied from the chart of [facility]. Lactation Consultation Note;  Mother states that infant fed well at 0930 for 50 mins. 25 mins on each breast. Mother then gave infant 34 ml of formula with a bottle. Mother states that she does hear infant swallow while on the breast. Observed large amts of colostrum when mother hand expresses. Advised mother to hand pump for 15 mins on each breast if she continues to give formula. Mothers breast are filing. Recommend that she ice breast and do good breast massage. Mother was given ice packs and demonstrated breast massage. Mother has been using a # 16 nipple shield for the first few mins of the feeding then she takes it off. Mother has a tiny nipple . She was taught to firm nipple with her fingers and pre pump for a few mins before latching her infant. Reviewed need to feed infant 8-12 times in 24 hours. Advised mother to call staff nurse or LC to observed and assist with next feeding.   Patient Name: [facility] Shawnie DapperLopez RUEAV'WToday's Date: 09/12/2014 Reason for consult: Follow-up assessment   Maternal Data    Feeding Feeding Type: Breast Fed Length of feed: 25 min  LATCH Score/Interventions                      Lactation Tools Discussed/Used     Consult Status Consult Status: Follow-up Date: 09/12/14 Follow-up type: In-patient    Stevan BornKendrick, Aryan Sparks [facility] 09/12/2014, 11:46 AM

## 2014-09-13 ENCOUNTER — Ambulatory Visit: Payer: Self-pay

## 2014-09-13 NOTE — Lactation Note (Signed)
This note was copied from the chart of Regina Jones. Lactation Consultation Note: Observed mother in side lying position. Infant latched with good depth and observed audible swallows. Mother's breast are full but not firm. Observed softening of breast as infant suckling. FOB is at bedside. Interpreted teaching to mother. Mother has a hand pump and not using . She is still supplementing with formula. Advised mother to continue to feed infant 8-12 times in 24 hours. Mothers nipples are intact without redness. Mother denies having any concerns or problems associated with breastfeeding her infant. Mother is active with WIC. She is aware of available LC services. Parents receptive to all teaching.   Patient Name: Regina Jones ZOXWR'UToday's Date: 09/13/2014 Reason for consult: Follow-up assessment   Maternal Data    Feeding Feeding Type: Breast Fed  LATCH Score/Interventions Latch: Grasps breast easily, tongue down, lips flanged, rhythmical sucking. Intervention(s): Assist with latch;Breast compression  Audible Swallowing: Spontaneous and intermittent Intervention(s): Hand expression  Type of Nipple: Everted at rest and after stimulation Intervention(s): Hand pump  Comfort (Breast/Nipple): Filling, red/small blisters or bruises, mild/mod discomfort  Problem noted: Filling  Hold (Positioning): No assistance needed to correctly position infant at breast.  LATCH Score: 9  Lactation Tools Discussed/Used     Consult Status Consult Status: Complete    Regina Jones, Regina Jones 09/13/2014, 9:44 AM

## 2014-09-14 ENCOUNTER — Inpatient Hospital Stay (HOSPITAL_COMMUNITY): Admission: RE | Admit: 2014-09-14 | Payer: Self-pay | Source: Ambulatory Visit

## 2014-09-14 ENCOUNTER — Telehealth: Payer: Self-pay | Admitting: *Deleted

## 2014-09-14 DIAGNOSIS — O24313 Unspecified pre-existing diabetes mellitus in pregnancy, third trimester: Secondary | ICD-10-CM

## 2014-09-14 NOTE — Telephone Encounter (Signed)
Regina Jones left a message on voice mail in Spanish- She states she has diabetes and thyroid issues and is on medicines. States the dosages changed before /during/ after pregnancy and she is not sure which dosage to take now that she is delivered.

## 2014-09-15 NOTE — Telephone Encounter (Signed)
Called Clarene ReamerAna Maria with [employer] [phone] . Unable to leave message as we heard a message the voicemail has not been set up.

## 2014-09-18 ENCOUNTER — Other Ambulatory Visit: Payer: Self-pay | Admitting: Obstetrics & Gynecology

## 2014-09-18 MED ORDER — LEVOTHYROXINE SODIUM 50 MCG PO TABS: 50.0000 ug | ORAL_TABLET | Freq: Every day | ORAL | Status: DC

## 2014-09-18 MED ORDER — METFORMIN HCL 500 MG PO TABS: 500.0000 mg | ORAL_TABLET | Freq: Two times a day (BID) | ORAL | Status: DC

## 2014-09-18 NOTE — Telephone Encounter (Addendum)
Called patient with Regina Jones for interpreter. Patient states she is unsure what medications she is supposed to be taking and what doses. Had Dr Macon LargeAnyanwu review patient's chart who ordered patient continue synthroid 50mcg and metformin 500mg  BID until pp visit, after pp visit patient will need to follow up with PCP for those. Informed patient of medications and new prescriptions sent to pharmacy. Patient verbalized understanding to all and had no questions

## 2014-09-18 NOTE — Addendum Note (Signed)
Addended by: Kathee DeltonHILLMAN, Tahiri Shareef L on: 09/18/2014 10:06 AM   Modules accepted: Orders, Medications

## 2014-10-05 ENCOUNTER — Encounter: Payer: Self-pay | Admitting: Obstetrics & Gynecology

## 2014-10-18 ENCOUNTER — Encounter: Payer: Self-pay | Admitting: Obstetrics & Gynecology

## 2014-10-18 ENCOUNTER — Ambulatory Visit (INDEPENDENT_AMBULATORY_CARE_PROVIDER_SITE_OTHER): Payer: Self-pay | Admitting: Obstetrics & Gynecology

## 2014-10-18 VITALS — BP 119/81 | HR 67 | Temp 98.2°F | Resp 20 | Ht 60.0 in | Wt 145.3 lb

## 2014-10-18 DIAGNOSIS — E039 Hypothyroidism, unspecified: Secondary | ICD-10-CM

## 2014-10-18 DIAGNOSIS — E119 Type 2 diabetes mellitus without complications: Secondary | ICD-10-CM

## 2014-10-18 MED ORDER — LEVOTHYROXINE SODIUM 50 MCG PO TABS: 50.0000 ug | ORAL_TABLET | Freq: Every day | ORAL | Status: DC

## 2014-10-18 MED ORDER — METFORMIN HCL 500 MG PO TABS: 500.0000 mg | ORAL_TABLET | Freq: Two times a day (BID) | ORAL | Status: DC

## 2014-10-18 NOTE — Patient Instructions (Signed)
Eleccin del mtodo anticonceptivo (Contraception Choices) La anticoncepcin (control de la natalidad) es el uso de cualquier mtodo o dispositivo para evitar el embarazo. A continuacin se indican algunos de esos mtodos. MTODOS HORMONALES   El Implante contraconceptivo consiste en un tubo plstico delgado que contiene la hormona progesterona. No contiene estrgenos. El mdico inserta el tubo en la parte interna del brazo. El tubo puede permanecer en el lugar durante 3 aos. Despus de los 3 aos debe retirarse. El implante impide que los ovarios liberen vulos (ovulacin), espesa el moco cervical, lo que evita que los espermatozoides ingresen al tero y hace ms delgada la membrana que cubre el interior del tero.  Inyecciones de progesterona sola: las administra el mdico cada 3 meses para evitar el embarazo. La progesterona sinttica impide que los ovarios liberen vulos. Tambin hacen que el moco cervical se espese y modifique el tejido de recubrimiento interno del tero. Esto hace ms difcil que los espermatozoides sobrevivan en el tero.  Las pldoras anticonceptivas contienen estrgenos y progesterona. Su funcin es evitar que los ovarios liberen vulos (ovulacin). Las hormonas de los anticonceptivos orales hacen que el moco cervical se haga ms espeso, lo que evita que el esperma ingrese al tero. Las pldoras anticonceptivas son recetadas por el mdico.Tambin se utilizan para tratar los perodos menstruales abundantes.  Minipldora: este tipo de pldora anticonceptiva contiene slo hormona progesterona. Deben tomarse todos los das del mes y debe recetarlas el mdico.  El parche de control de natalidad: contiene hormonas similares a las que contienen las pldoras anticonceptivas. Deben cambiarse una vez por semana y se utilizan bajo prescripcin mdica.  Anillo vaginal: contiene hormonas similares a las que contienen las pldoras anticonceptivas. Se deja colocado durante tres semanas,  se lo retira durante 1 semana y luego se coloca uno nuevo. La paciente debe sentirse cmoda al insertar y retirar el anillo de la vagina.Es necesaria la prescripcin mdica.  Anticonceptivos de emergencia: son mtodos para evitar un embarazo despus de una relacin sexual sin proteccin. Esta pldora puede tomarse inmediatamente despus de tener relaciones sexuales o hasta 5 das de haber tenido sexo sin proteccin. Es ms efectiva si se toma poco tiempo despus de la relacin sexual. Los anticonceptivos de emergencia estn disponibles sin prescripcin mdica. Consltelo con su farmacutico. No use los anticonceptivos de emergencia como nico mtodo anticonceptivo. MTODOS DE BARRERA   Condn masculino: es una vaina delgada (ltex o goma) que se coloca cubriendo al pene durante el acto sexual. Puede usarse con espermicida para aumentar la efectividad.  Condn femenino. Es una funda delicada y blanda que se adapta holgadamente a la vagina antes de las relaciones sexuales.  Diafragma: es una barrera de ltex redonda y suave que debe ser recomendado por un profesional. Se inserta en la vagina, junto con un gel espermicida. Debe insertarse antes de tener relaciones sexuales. Debe dejar el diafragma colocado en la vagina durante 6 a 8 horas despus de la relacin sexual.  Capuchn cervical: es una barrera de ltex o taza plstica redonda y suave que cubre el cuello del tero y debe ser colocada por un mdico. Puede dejarlo colocado en la vagina hasta 48 horas despus de las relaciones sexuales.  Esponja: es una pieza blanda y circular de espuma de poliuretano. Contiene un espermicida. Se inserta en la vagina despus de mojarla y antes de las relaciones sexuales.  Espermicidas: son sustancias qumicas que matan o bloquean al esperma y no lo dejan ingresar al cuello del tero y al tero. Vienen   en forma de cremas, geles, supositorios, espuma o comprimidos. No es necesario tener receta mdica. Se insertan en  la vagina con un aplicador antes de tener relaciones sexuales. El proceso debe repetirse cada vez que tiene relaciones sexuales. ANTICONCEPTIVOS INTRAUTERINOS  Dispositivo intrauterino (DIU) es un dispositivo en forma de T que se coloca en el tero durante el perodo menstrual, para evitar el embarazo. Hay dos tipos:  DIU de cobre: este tipo de DIU est recubierto con un alambre de cobre y se inserta dentro del tero. El cobre hace que el tero y las trompas de Falopio produzcan un liquido que destruye los espermatozoides. Puede permanecer colocado durante 10 aos.  DIU con hormona: este tipo de DIU contiene la hormona progestina (progesterona sinttica). La hormona espesa el moco cervical y evita que los espermatozoides ingresen al tero y tambin afina la membrana que cubre el tero para evitar la implantacin del vulo fertilizado. La hormona debilita o destruye los espermatozoides que ingresan al tero. Puede permanecer en el lugar durante 3-5 aos, segn el tipo de DIU que se utilice. MTODOS ANTICONCEPTIVOS PERMANENTES  Ligadura de trompas en la mujer: se realiza sellando, atando u obstruyendo quirrgicamente las trompas de Falopio lo que impide que el vulo descienda hacia el tero.  Esterilizacin histeroscpica: Implica la colocacin de un pequeo espiral o la insercin en cada trompa de Falopio. El mdico utiliza una tcnica llamada histeroscopa para realizar este procedimiento. El dispositivo produce la formacin de tejido cicatrizal. Esto da como resultado una obstruccin permanente de las trompas de Falopio, de modo que la esperma no pueda fertilizar el vulo. Demora alrededor de 3 meses despus del procedimiento hasta que el conducto se obstruye. Tendr que usar otro mtodo anticonceptivo durante al menos 3 meses.  Esterilizacin masculina: se realiza ligando los conductos por los que pasan los espermatozoides (vasectoma).Esto impide que el esperma ingrese a la vagina durante el acto  sexual. Luego del procedimiento, el hombre puede eyacular lquido (semen). MTODOS DE PLANIFICACIN NATURAL  Planificacin familiar natural: consiste en no tener relaciones sexuales o usar un mtodo de barrera (condn, diafragma, capuchn cervical) en los das que la mujer podra quedar embarazada.  Mtodo de calendario: consiste en el seguimiento de la duracin de cada ciclo menstrual y la identificacin de los perodos frtiles.  Mtodo de ovulacin: consiste en evitar las relaciones sexuales durante la ovulacin.  Mtodo sintotrmico: consiste en evitar las relaciones sexuales en la poca en la que se est ovulando, utilizando un termmetro y tendiendo en cuenta los sntomas de la ovulacin.  Mtodo postovulacin: consiste en planificar las relaciones sexuales para despus de haber ovulado. Independientemente del tipo o mtodo anticonceptivo que usted elija, es importante que use condones para protegerse contra las infecciones de transmisin sexual (ETS). Hable con su mdico con respecto a qu mtodo anticonceptivo es el ms apropiado para usted. Document Released: 06/23/2005 Document Revised: 02/23/2013 ExitCare Patient Information 2015 [employer]. This information is not intended to replace advice given to you by your health care provider. Make sure you discuss any questions you have with your health care provider.  

## 2014-10-18 NOTE — Progress Notes (Signed)
Regina Jones present for encounter Subjective:     Regina Jones Regina Jones is a 24 y.o. female who presents for a postpartum visit. She is 5 weeks postpartum following a spontaneous vaginal delivery. I have fully reviewed the prenatal and intrapartum course. The delivery was at 38.2 gestational weeks. Outcome: spontaneous vaginal delivery with first degree vaginal laceration. Anesthesia: epidural. Postpartum course has been uncomplicated. Baby's course has been without difficulty. Baby is feeding by breast. Bleeding no bleeding. Bowel function is normal. Bladder function is normal. Patient is sexually active once @ 4wks after delivery but encountered pain. Contraception method is condoms. Postpartum depression screening: negative. Pt wants to continue condoms for contraception.  Pt with a h/o DM and hypothyroidism PRIOR to pregnancy.  She is actively taking her meds.   The following portions of the patient's history were reviewed and updated as appropriate: allergies, current medications, past family history, past medical history, past social history, past surgical history and problem list.  Review of Systems A comprehensive review of systems was negative.   Objective:    BP 119/81 mmHg  Pulse 67  Temp(Src) 98.2 F (36.8 C) (Oral)  Resp 20  Ht 5' (1.524 m)  Wt 145 lb 4.8 oz (65.908 kg)  BMI 28.38 kg/m2  Breastfeeding? Yes       Exam deferred Assessment:     5 weeks postpartum exam. Pap smear not done at today's visit.   Contraception counseling- reviewed options with pt but, she opts for condoms only. DM- stable Hypothyroidism- stable.      Plan:    1. Contraception: condoms 2.  Follow up in: 1 year or as needed.   3. F/u with primary care for DM and hypothyroid in 3 months 4. Refilled Metformin

## 2014-10-18 NOTE — Progress Notes (Deleted)
Patient ID: Blake DivineAna Maria Angeles Jones, female   DOB: 11/14/1990, 24 y.o.   MRN: [id number]

## 2015-07-08 NOTE — L&D Delivery Note (Signed)
Delivery Note At 1:19 PM a viable female was delivered via Vaginal, Spontaneous Delivery (Presentation: vertex ; OA  ).  APGAR:8 , 9 ; weightpending  .   Placenta status: delivered intact with gentle traction, approximately 35% abruption noted.  Cord: 3 vessels with the following complications: none  Anesthesia:  epidrual Episiotomy: None Lacerations: None Suture Repair: 3.0 vicryl Est. Blood Loss (mL): 200  Mom to postpartum.  Baby to NICU.  Regina Jones 04/03/2016, 1:37 PM

## 2015-09-26 ENCOUNTER — Ambulatory Visit (INDEPENDENT_AMBULATORY_CARE_PROVIDER_SITE_OTHER): Payer: Self-pay | Admitting: *Deleted

## 2015-09-26 ENCOUNTER — Encounter: Payer: Self-pay | Admitting: *Deleted

## 2015-09-26 DIAGNOSIS — Z3490 Encounter for supervision of normal pregnancy, unspecified, unspecified trimester: Secondary | ICD-10-CM

## 2015-09-26 DIAGNOSIS — Z3201 Encounter for pregnancy test, result positive: Secondary | ICD-10-CM

## 2015-09-26 LAB — POCT PREGNANCY, URINE: Preg Test, Ur: POSITIVE — AB

## 2015-09-27 ENCOUNTER — Ambulatory Visit (HOSPITAL_COMMUNITY)
Admission: RE | Admit: 2015-09-27 | Discharge: 2015-09-27 | Disposition: A | Discharge status: Home / Self Care | Payer: Self-pay | Source: Ambulatory Visit | Attending: Obstetrics & Gynecology | Admitting: Obstetrics & Gynecology

## 2015-09-27 ENCOUNTER — Other Ambulatory Visit: Payer: Self-pay | Admitting: Obstetrics & Gynecology

## 2015-09-27 DIAGNOSIS — Z3490 Encounter for supervision of normal pregnancy, unspecified, unspecified trimester: Secondary | ICD-10-CM

## 2015-09-27 DIAGNOSIS — Z3A01 Less than 8 weeks gestation of pregnancy: Secondary | ICD-10-CM | POA: Insufficient documentation

## 2015-09-27 DIAGNOSIS — Z36 Encounter for antenatal screening of mother: Secondary | ICD-10-CM | POA: Insufficient documentation

## 2015-09-27 IMAGING — US US OB COMP LESS 14 WK
1 series · 15 of 28 positions shown · non-contrast
Comparison: No prior scans from this gestation.

CLINICAL DATA: 24-year-old pregnant female presenting for
assessment of fetal dating and viability. Uncertain LMP.

EXAM:
OBSTETRIC <14 WK US AND TRANSVAGINAL OB US
TECHNIQUE: Both transabdominal and transvaginal ultrasound examinations were
performed for complete evaluation of the gestation as well as the
maternal uterus, adnexal regions, and pelvic cul-de-sac.
Transvaginal technique was performed to assess early pregnancy.

[Series 1: us ob comp less 14 wk · 15 of 28 slices shown]
[im 1/28]
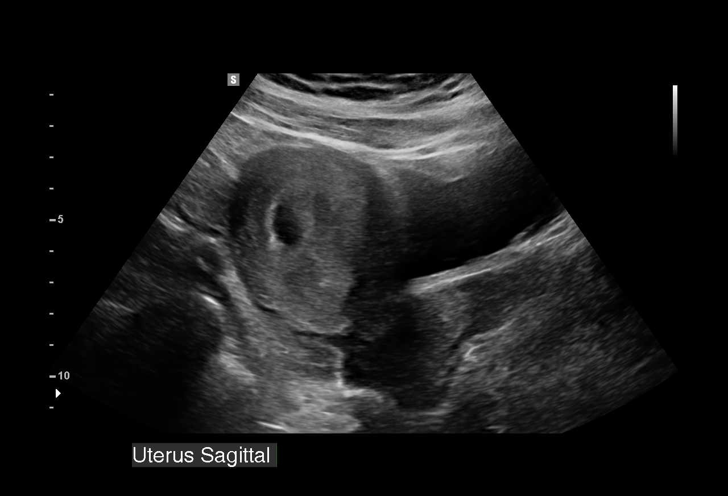
[im 3/28]
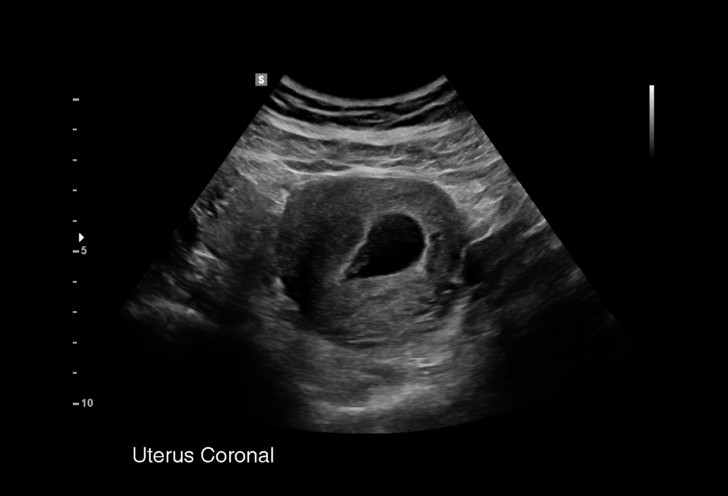
[im 5/28]
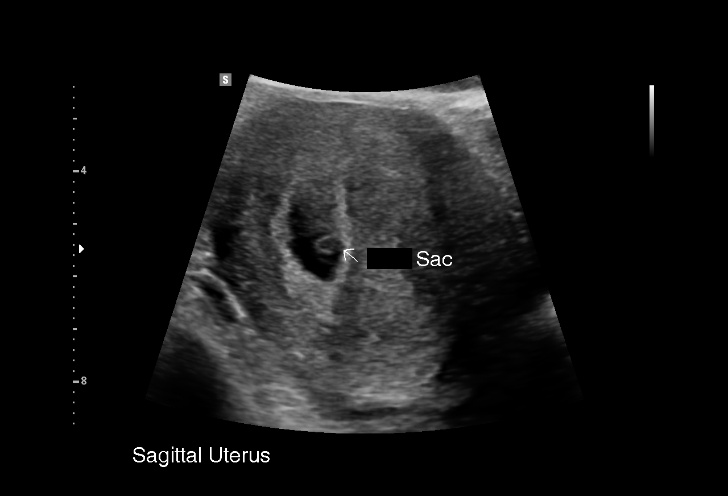
[im 7/28]
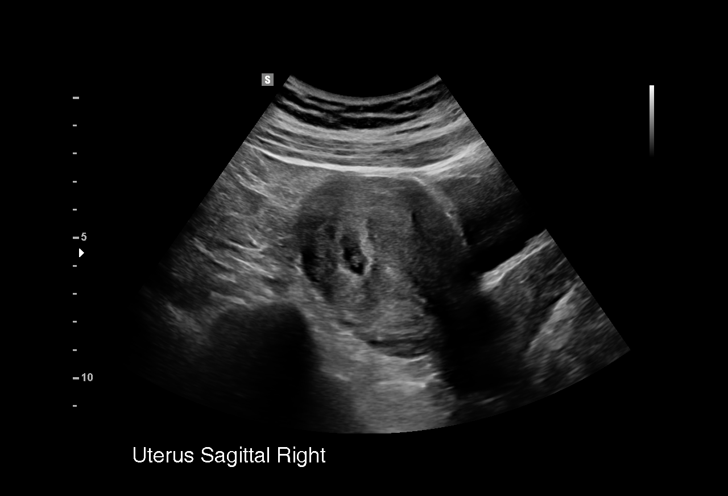
[im 9/28]
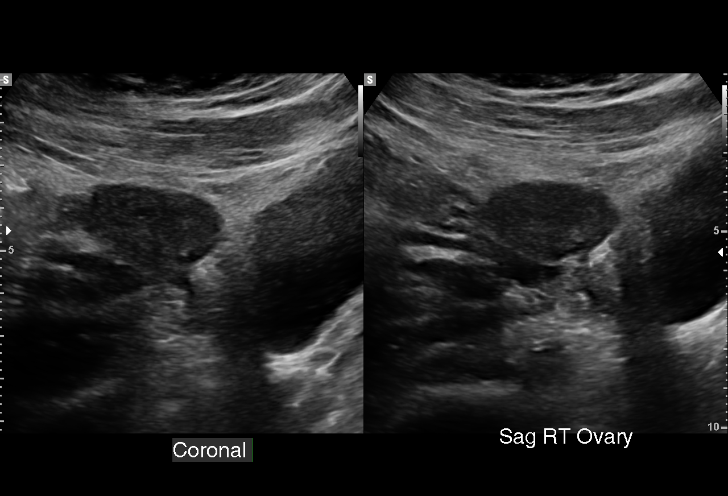
[im 11/28]
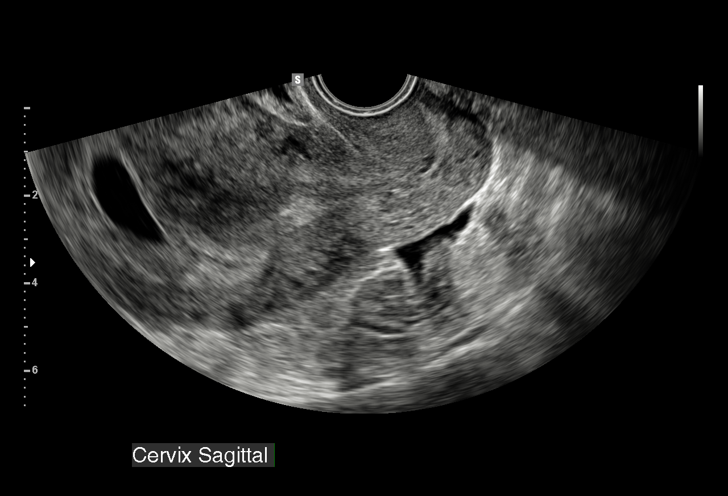
[im 13/28]
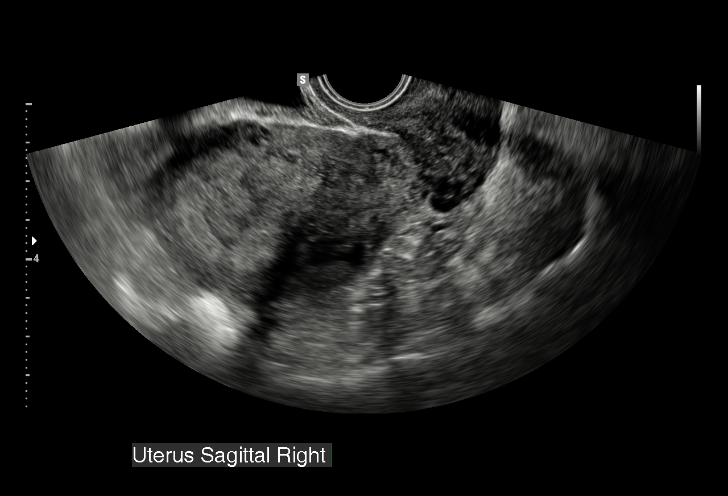
[im 15/28]
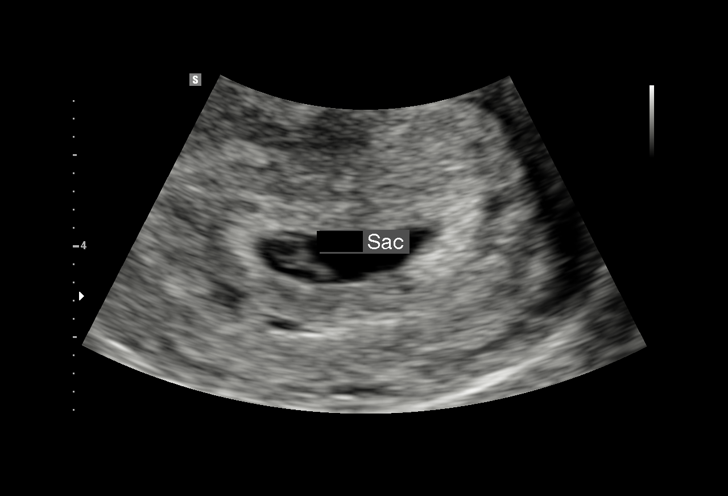
[im 16/28]
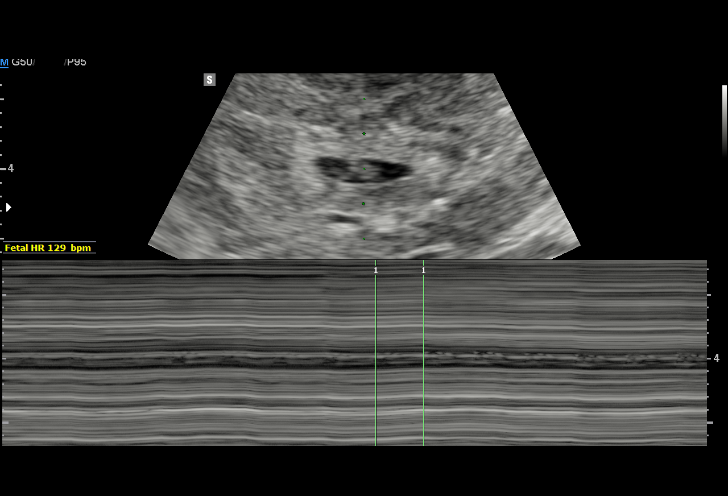
[im 18/28]
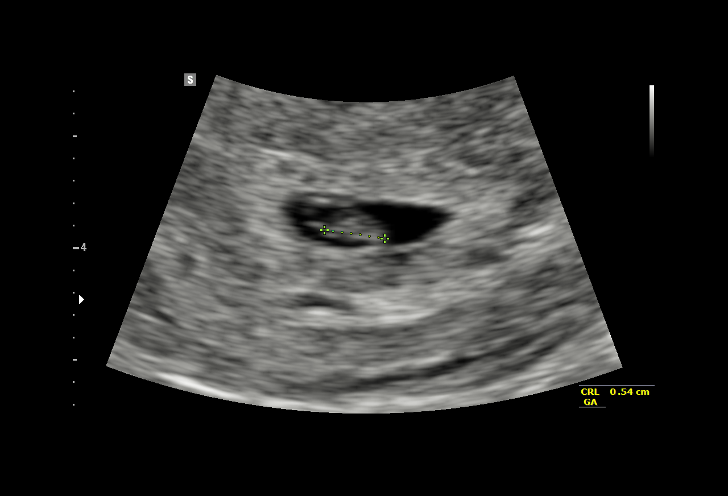
[im 20/28]
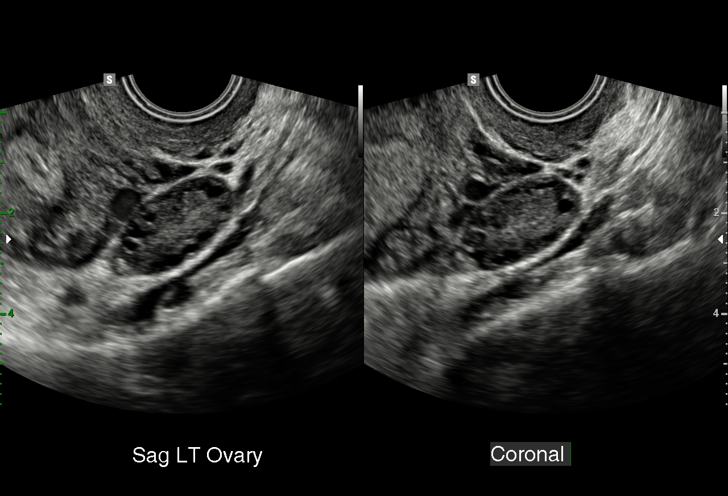
[im 22/28]
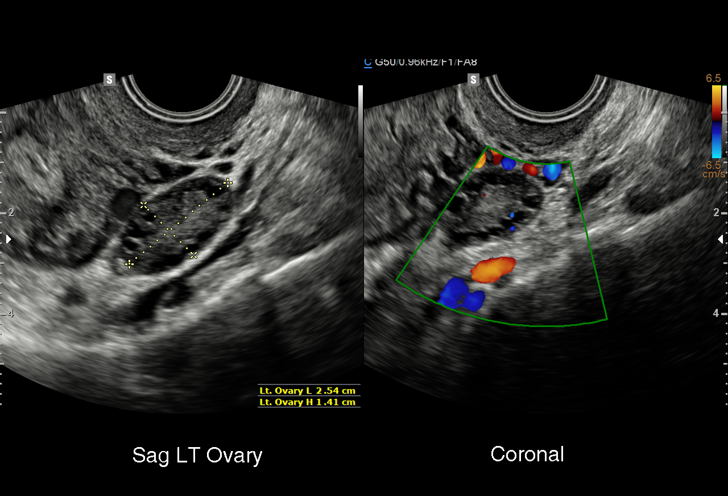
[im 24/28]
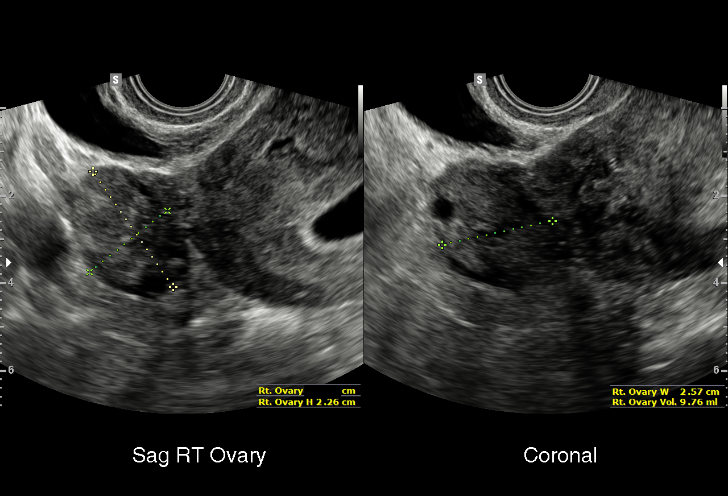
[im 26/28]
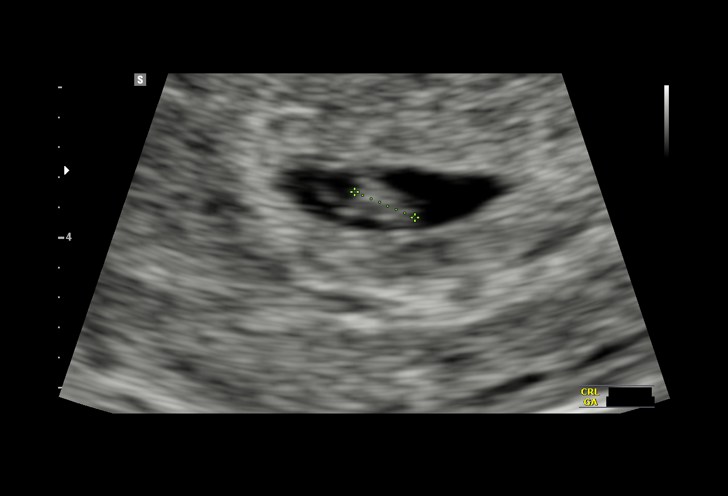
[im 28/28]
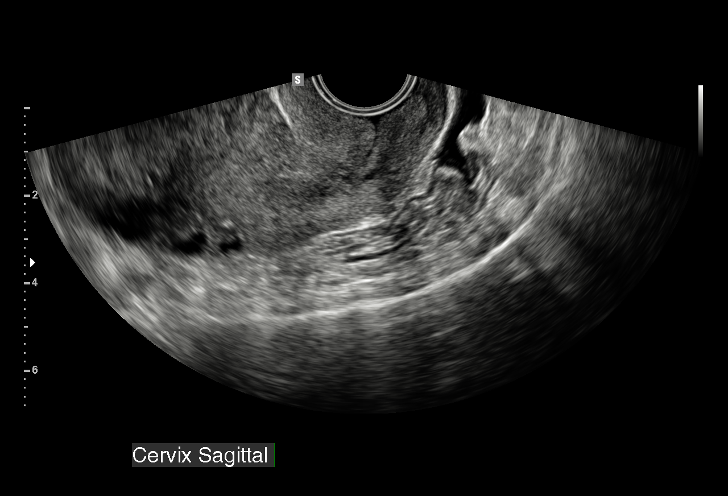

[15 of 28 positions shown; findings below may reference images not displayed]

FINDINGS: Intrauterine gestational sac: Single intrauterine gestational sac
appears normal in size, shape and position.

Yolk sac:  Present and normal.

Embryo:  Present.

Embryonic Cardiac Activity: Regular rate and rhythm.

Heart Rate: 129  bpm

CRL:  4.8  mm   6 w   3 d                  US EDC: 05/19/2016

Subchorionic hemorrhage:  None visualized.

Maternal uterus/adnexae: Probable right ovarian corpus luteum. No
suspicious ovarian or adnexal masses. No abnormal free fluid in the
pelvis. No uterine fibroids.
IMPRESSION: 1. Single living intrauterine gestation at 6 weeks 3 days by
crown-rump length.
2. No early first-trimester gestational abnormality.
3. No ovarian or adnexal abnormality .

## 2015-10-08 ENCOUNTER — Telehealth: Payer: Self-pay | Admitting: General Practice

## 2015-10-08 NOTE — Telephone Encounter (Signed)
Per Dr Macon LargeAnyanwu, patient's u/s shows viable IUP. She can start prenatal vitamins and initiate prenatal care. Called patient with Regina PerkingMaria Jones for interpreter and informed her of u/s results including due date and gestational age. Also reminded patient of appt in our office. Patient verbalized understanding to all & had no questions

## 2015-10-15 ENCOUNTER — Encounter: Payer: Self-pay | Admitting: Obstetrics and Gynecology

## 2015-10-15 ENCOUNTER — Ambulatory Visit (INDEPENDENT_AMBULATORY_CARE_PROVIDER_SITE_OTHER): Payer: Medicaid Other | Admitting: Obstetrics and Gynecology

## 2015-10-15 ENCOUNTER — Encounter: Payer: Medicaid Other | Attending: Obstetrics and Gynecology | Admitting: *Deleted

## 2015-10-15 VITALS — BP 126/91 | HR 72 | Temp 98.2°F | Wt 153.9 lb

## 2015-10-15 DIAGNOSIS — O99281 Endocrine, nutritional and metabolic diseases complicating pregnancy, first trimester: Secondary | ICD-10-CM

## 2015-10-15 DIAGNOSIS — O24919 Unspecified diabetes mellitus in pregnancy, unspecified trimester: Secondary | ICD-10-CM

## 2015-10-15 DIAGNOSIS — Z113 Encounter for screening for infections with a predominantly sexual mode of transmission: Secondary | ICD-10-CM | POA: Diagnosis not present

## 2015-10-15 DIAGNOSIS — Z8759 Personal history of other complications of pregnancy, childbirth and the puerperium: Secondary | ICD-10-CM | POA: Diagnosis not present

## 2015-10-15 DIAGNOSIS — O0991 Supervision of high risk pregnancy, unspecified, first trimester: Secondary | ICD-10-CM

## 2015-10-15 DIAGNOSIS — O99280 Endocrine, nutritional and metabolic diseases complicating pregnancy, unspecified trimester: Secondary | ICD-10-CM

## 2015-10-15 DIAGNOSIS — E039 Hypothyroidism, unspecified: Secondary | ICD-10-CM

## 2015-10-15 DIAGNOSIS — O10919 Unspecified pre-existing hypertension complicating pregnancy, unspecified trimester: Secondary | ICD-10-CM

## 2015-10-15 DIAGNOSIS — Z029 Encounter for administrative examinations, unspecified: Secondary | ICD-10-CM | POA: Insufficient documentation

## 2015-10-15 DIAGNOSIS — O10911 Unspecified pre-existing hypertension complicating pregnancy, first trimester: Secondary | ICD-10-CM | POA: Diagnosis not present

## 2015-10-15 DIAGNOSIS — O0990 Supervision of high risk pregnancy, unspecified, unspecified trimester: Secondary | ICD-10-CM

## 2015-10-15 DIAGNOSIS — O24911 Unspecified diabetes mellitus in pregnancy, first trimester: Secondary | ICD-10-CM

## 2015-10-15 DIAGNOSIS — Z124 Encounter for screening for malignant neoplasm of cervix: Secondary | ICD-10-CM

## 2015-10-15 LAB — COMPREHENSIVE METABOLIC PANEL
ALBUMIN: 4.3 g/dL (ref 3.6–5.1)
ALT: 21 U/L (ref 6–29)
AST: 15 U/L (ref 10–30)
Alkaline Phosphatase: 54 U/L (ref 33–115)
BILIRUBIN TOTAL: 0.3 mg/dL (ref 0.2–1.2)
BUN: 11 mg/dL (ref 7–25)
CALCIUM: 8.9 mg/dL (ref 8.6–10.2)
CHLORIDE: 102 mmol/L (ref 98–110)
CO2: 23 mmol/L (ref 20–31)
CREATININE: 0.47 mg/dL — AB (ref 0.50–1.10)
Glucose, Bld: 90 mg/dL (ref 65–99)
Potassium: 3.9 mmol/L (ref 3.5–5.3)
SODIUM: 136 mmol/L (ref 135–146)
TOTAL PROTEIN: 6.9 g/dL (ref 6.1–8.1)

## 2015-10-15 LAB — HEMOGLOBIN A1C
Hgb A1c MFr Bld: 6.3 % — ABNORMAL HIGH (ref <5.7)
Mean Plasma Glucose: 134 mg/dL

## 2015-10-15 MED ORDER — LEVOTHYROXINE SODIUM 25 MCG PO TABS: 12.5000 ug | ORAL_TABLET | Freq: Every day | ORAL | Status: DC

## 2015-10-15 MED ORDER — METFORMIN HCL 500 MG PO TABS: 500.0000 mg | ORAL_TABLET | Freq: Two times a day (BID) | ORAL | Status: DC

## 2015-10-15 NOTE — Progress Notes (Signed)
Spanish Interpreter Amanda Benbassat  Pain- lower back  New ob packet given

## 2015-10-15 NOTE — Progress Notes (Signed)
  Patient was seen on 10/15/15 for Gestational Diabetes self-management . The following learning objectives were met by the patient :   States the definition of Gestational Diabetes  States when to check blood glucose levels  Demonstrates proper blood glucose monitoring techniques  States the effect of stress and exercise on blood glucose levels  States the importance of limiting caffeine and abstaining from alcohol and smoking  Plan:  Consider  increasing your activity level by walking daily as tolerated Begin checking BG before breakfast and 2 hours after first bit of breakfast, lunch and dinner after  as directed by MD  Take medication  as directed by MD  Blood glucose monitor given: [employer] Lot # [id number] Exp: 2017-09-18 Blood glucose reading: FBS 29m/dl  Patient instructed to monitor glucose levels: FBS: 60 - <90 2 hour: <120  Patient received the following handouts:  Nutrition Diabetes and Pregnancy  Patient will be seen for follow-up in one week.

## 2015-10-15 NOTE — Progress Notes (Signed)
Subjective:  Jarrett Sohona Maria Angeles Shawnie DapperLopez is a 25 y.o. [phone] at 3621w0d being seen today for initial prenatal care.  She is currently monitored for the following issues for this high-risk pregnancy and has Diabetes mellitus without complication (HCC); Hypothyroidism; Hypothyroidism affecting pregnancy; Diabetes mellitus affecting pregnancy (HCC); History of gestational hypertension; and Supervision of high-risk pregnancy on her problem list.  Patient reports no complaints.  Contractions: Not present. Vag. Bleeding: None.   . Denies leaking of fluid.   The following portions of the patient's history were reviewed and updated as appropriate: allergies, current medications, past family history, past medical history, past social history, past surgical history and problem list. Problem list updated.  Objective:   Filed Vitals:   10/15/15 0834 10/15/15 0835  BP: 148/82 126/91  Pulse: 90 72  Temp: 98.2 F (36.8 C)   Weight: 153 lb 14.4 oz (69.809 kg)     Fetal Status:           General:  Alert, oriented and cooperative. Patient is in no acute distress.  Skin: Skin is warm and dry. No rash noted.   Cardiovascular: Normal heart rate noted  Respiratory: Normal respiratory effort, no problems with respiration noted  Abdomen: Soft, gravid, appropriate for gestational age. Pain/Pressure: Present     Pelvic: Vag. Bleeding: None     Cervical exam deferred        Extremities: Normal range of motion.  Edema: None  Mental Status: Normal mood and affect. Normal behavior. Normal judgment and thought content.   Urinalysis:      Assessment and Plan:  Pregnancy: G4P1021 at 421w0d  1. Supervision of high risk pregnancy in first trimester - Cytology - PAP - GC/Chlamydia probe amp ([place] - Culture, OB Urine - Prescript Monitor Profile(19) - Prenatal Profile - US MFM Fetal Nuchal Translucency; Future - Hemoglobinopathy evaluation - Cystic fibrosis diagnostic study - Hemoglobin A1c -  Comprehensive metabolic panel - Protein / creatinine ratio, urine - GC[facility]Chlamydia probe amp ([place]  2. Hypothyroidism, unspecified hypothyroidism type - increase dose from 50 to 62.5 - TSH  4. Diabetes mellitus affecting pregnancy, unspecified trimester (HCC) - continue metformin - dm educator today, start checking glucose 4x daily, f/u with her one week  5. History of gestational hypertension, possible chronic hypertension - BP elevated today, hx ghtn - monitor. Start asa @ 14 wks   Preterm labor symptoms and general obstetric precautions including but not limited to vaginal bleeding, contractions, leaking of fluid and fetal movement were reviewed in detail with the patient. Please refer to After Visit Summary for other counseling recommendations.    Kathrynn RunningNoah Bedford Wouk, MD

## 2015-10-15 NOTE — Progress Notes (Signed)
Nutrition note: 1st visit consult & GDM diet education Pt has h/o obesity & DM. Pt has lost 1.1# @ 9w. Pt reports eating 3 meals & 1-2 snacks/d. Pt reports that she has started watching her portion sizes again since she found out she is pregnant. Pt is taking a PNV. Pt reports no N&V or heartburn. NKFA. Pt reports walking 2-3x/ wk for 20-7030mins. Pt received verbal & written education on DM diet during pregnancy in Spanish via an interpreter. Encouraged ~30 mins of walking most days of the week. Discussed wt gain goals of 11-20# or 0.5#/wk. Pt agrees to follow DM diet with 3 meals & 1-3 snacks/d with proper CHO/ protein combination. Pt does not have WIC - plans to apply next week. Pt plans to BF. F/u in 1 wk (no WIC & complete Worksheet) Regina RevealLaura Marty Sadlowski, MS, RD, LDN, [facility]

## 2015-10-16 LAB — PRENATAL PROFILE (SOLSTAS)
Antibody Screen: NEGATIVE
BASOS ABS: 0 {cells}/uL (ref 0–200)
Basophils Relative: 0 %
Eosinophils Absolute: 118 cells/uL (ref 15–500)
Eosinophils Relative: 2 %
HCT: 38.9 % (ref 35.0–45.0)
HIV: NONREACTIVE
Hemoglobin: 13 g/dL (ref 11.7–15.5)
Hepatitis B Surface Ag: NEGATIVE
Lymphocytes Relative: 37 %
Lymphs Abs: 2183 cells/uL (ref 850–3900)
MCH: 32.3 pg (ref 27.0–33.0)
MCHC: 33.4 g/dL (ref 32.0–36.0)
MCV: 96.5 fL (ref 80.0–100.0)
MONO ABS: 354 {cells}/uL (ref 200–950)
MPV: 9.9 fL (ref 7.5–12.5)
Monocytes Relative: 6 %
NEUTROS PCT: 55 %
Neutro Abs: 3245 cells/uL (ref 1500–7800)
PLATELETS: 263 10*3/uL (ref 140–400)
RBC: 4.03 MIL/uL (ref 3.80–5.10)
RDW: 14.4 % (ref 11.0–15.0)
RUBELLA: 5.13 {index} — AB (ref <0.90)
Rh Type: POSITIVE
WBC: 5.9 10*3/uL (ref 3.8–10.8)

## 2015-10-16 LAB — HEMOGLOBINOPATHY EVALUATION
HGB A2 QUANT: 2.8 % (ref 2.2–3.2)
HGB A: 97.2 % (ref 96.8–97.8)
Hemoglobin Other: 0 %
Hgb F Quant: 0 % (ref 0.0–2.0)
Hgb S Quant: 0 %

## 2015-10-16 LAB — PROTEIN / CREATININE RATIO, URINE
Creatinine, Urine: 218 mg/dL (ref 20–320)
Protein Creatinine Ratio: 73 mg/g creat (ref 21–161)
Total Protein, Urine: 16 mg/dL (ref 5–24)

## 2015-10-16 LAB — GC/CHLAMYDIA PROBE AMP (~~LOC~~) NOT AT ARMC
CHLAMYDIA, DNA PROBE: NEGATIVE
Neisseria Gonorrhea: NEGATIVE

## 2015-10-16 LAB — CULTURE, OB URINE
Colony Count: NO GROWTH
Organism ID, Bacteria: NO GROWTH

## 2015-10-17 LAB — PRESCRIPTION MONITORING PROFILE (19 PANEL)
Amphetamine/Meth: NEGATIVE ng/mL
Barbiturate Screen, Urine: NEGATIVE ng/mL
Benzodiazepine Screen, Urine: NEGATIVE ng/mL
Buprenorphine, Urine: NEGATIVE ng/mL
CARISOPRODOL, URINE: NEGATIVE ng/mL
COCAINE METABOLITES: NEGATIVE ng/mL
CREATININE, URINE: 340.68 mg/dL (ref >20.0)
Cannabinoid Scrn, Ur: NEGATIVE ng/mL
Fentanyl, Ur: NEGATIVE ng/mL
MDMA URINE: NEGATIVE ng/mL
MEPERIDINE UR: NEGATIVE ng/mL
METHADONE SCREEN, URINE: NEGATIVE ng/mL
Methaqualone: NEGATIVE ng/mL
Nitrites, Initial: NEGATIVE ug/mL
OPIATE SCREEN, URINE: NEGATIVE ng/mL
OXYCODONE SCRN UR: NEGATIVE ng/mL
PH URINE, INITIAL: 5.9 pH (ref 4.5–8.9)
Phencyclidine, Ur: NEGATIVE ng/mL
Propoxyphene: NEGATIVE ng/mL
Tapentadol, urine: NEGATIVE ng/mL
Tramadol Scrn, Ur: NEGATIVE ng/mL
Zolpidem, Urine: NEGATIVE ng/mL

## 2015-10-17 LAB — CYTOLOGY - PAP

## 2015-10-22 ENCOUNTER — Encounter: Payer: Medicaid Other | Admitting: *Deleted

## 2015-10-22 ENCOUNTER — Ambulatory Visit: Payer: Self-pay | Admitting: *Deleted

## 2015-10-22 VITALS — Wt 149.0 lb

## 2015-10-22 DIAGNOSIS — Z029 Encounter for administrative examinations, unspecified: Secondary | ICD-10-CM | POA: Diagnosis not present

## 2015-10-22 DIAGNOSIS — O24119 Pre-existing diabetes mellitus, type 2, in pregnancy, unspecified trimester: Secondary | ICD-10-CM

## 2015-10-22 NOTE — Progress Notes (Addendum)
Nutrition note: DM diet f/u Pt has lost 6# @ 10w - pt reports she has had a lot of N&V and has not been eating breakfast. Pt was encouraged to try B6 & unisom by RN. Pt reports eating 2 meals & 3 snacks/d. Pt reports walking 20 mins 3-4x/wk. Pt has been checking her BS- fasting: 80-92, 2hr pp; 98-125 Reviewed DM diet - pt asked about serving size for lentils. Encouraged pt to continue following the DM diet with appropriate meals & snacks. Encouraged pt to try anti-nausea meds & try to eat breakfast. F/u in 4-6 wks Blondell RevealLaura Miller Edgington, MS, RD, LDN, [facility]

## 2015-10-22 NOTE — Progress Notes (Signed)
Patient presents for review of glucose readings. All >90% of glucose readings WNL. No change to medication. Has appointment in 1 week with MD.

## 2015-10-23 LAB — CYSTIC FIBROSIS DIAGNOSTIC STUDY

## 2015-10-24 ENCOUNTER — Encounter (HOSPITAL_COMMUNITY): Payer: Self-pay | Admitting: Obstetrics and Gynecology

## 2015-11-07 ENCOUNTER — Other Ambulatory Visit: Payer: Self-pay

## 2015-11-07 ENCOUNTER — Telehealth: Payer: Self-pay

## 2015-11-07 MED ORDER — METFORMIN HCL 500 MG PO TABS: 500.0000 mg | ORAL_TABLET | Freq: Two times a day (BID) | ORAL | Status: DC

## 2015-11-07 NOTE — Telephone Encounter (Signed)
Pt is requesting a refill on her metformin. She currently has gest diabetes. Metformin has been called into patient pharmacy.

## 2015-11-07 NOTE — Telephone Encounter (Signed)
Patient needs a refill on her metformin 500 mg she is out, she also said if she can get a 3 month refill it would be cheaper for her. She only speaks spanish. Her pharmacy is the same as in her chart.

## 2015-11-08 ENCOUNTER — Encounter (HOSPITAL_COMMUNITY): Payer: Self-pay

## 2015-11-08 ENCOUNTER — Ambulatory Visit (HOSPITAL_COMMUNITY)
Admission: RE | Admit: 2015-11-08 | Discharge: 2015-11-08 | Disposition: A | Discharge status: Home / Self Care | Payer: Self-pay | Source: Ambulatory Visit | Attending: Obstetrics and Gynecology | Admitting: Obstetrics and Gynecology

## 2015-11-08 ENCOUNTER — Other Ambulatory Visit: Payer: Self-pay | Admitting: Obstetrics and Gynecology

## 2015-11-08 VITALS — BP 128/77 | HR 81 | Wt 153.5 lb

## 2015-11-08 DIAGNOSIS — Z36 Encounter for antenatal screening of mother: Secondary | ICD-10-CM | POA: Insufficient documentation

## 2015-11-08 DIAGNOSIS — E0590 Thyrotoxicosis, unspecified without thyrotoxic crisis or storm: Secondary | ICD-10-CM

## 2015-11-08 DIAGNOSIS — O24911 Unspecified diabetes mellitus in pregnancy, first trimester: Secondary | ICD-10-CM

## 2015-11-08 DIAGNOSIS — Z3A12 12 weeks gestation of pregnancy: Secondary | ICD-10-CM | POA: Insufficient documentation

## 2015-11-08 DIAGNOSIS — O0990 Supervision of high risk pregnancy, unspecified, unspecified trimester: Secondary | ICD-10-CM

## 2015-11-08 DIAGNOSIS — O99281 Endocrine, nutritional and metabolic diseases complicating pregnancy, first trimester: Secondary | ICD-10-CM | POA: Insufficient documentation

## 2015-11-08 DIAGNOSIS — O0991 Supervision of high risk pregnancy, unspecified, first trimester: Secondary | ICD-10-CM

## 2015-11-08 DIAGNOSIS — E039 Hypothyroidism, unspecified: Secondary | ICD-10-CM | POA: Insufficient documentation

## 2015-11-08 IMAGING — US US MFM FETAL NUCHAL TRANSLUCENCY
1 series · 15 of 28 positions shown · non-contrast
Comparison: none

[Series 1: us mfm fetal nuchal translucency · 15 of 33 slices shown]
[im 1/33]
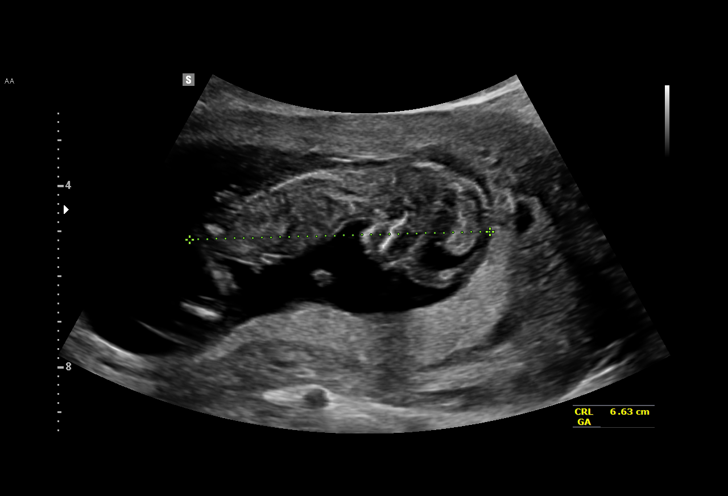
[im 3/33]
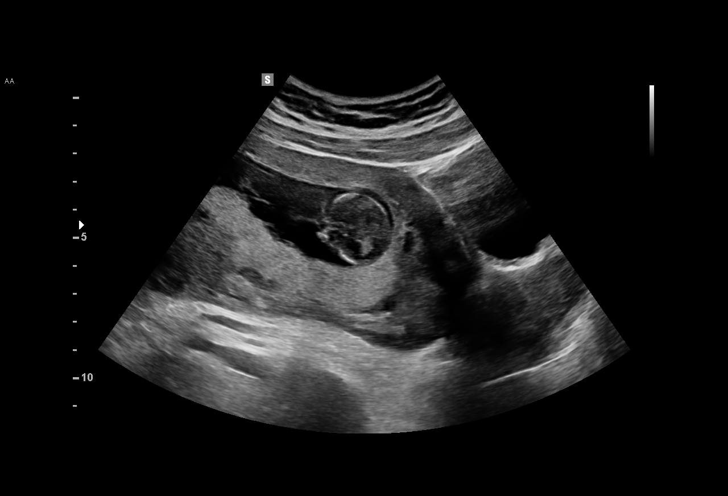
[im 5/33]
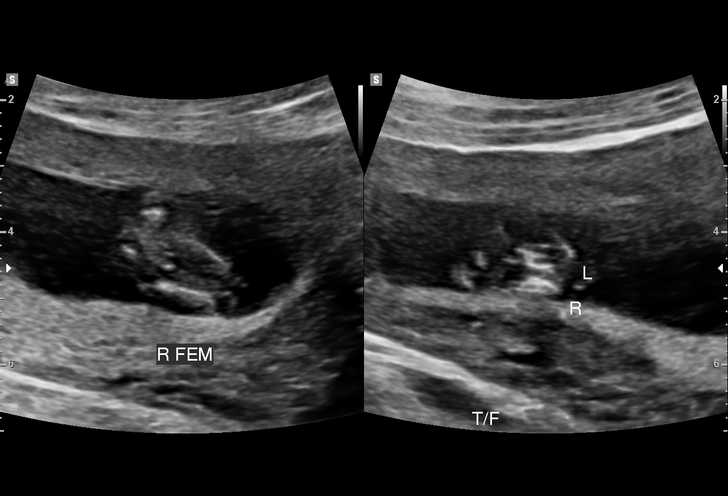
[im 8/33]
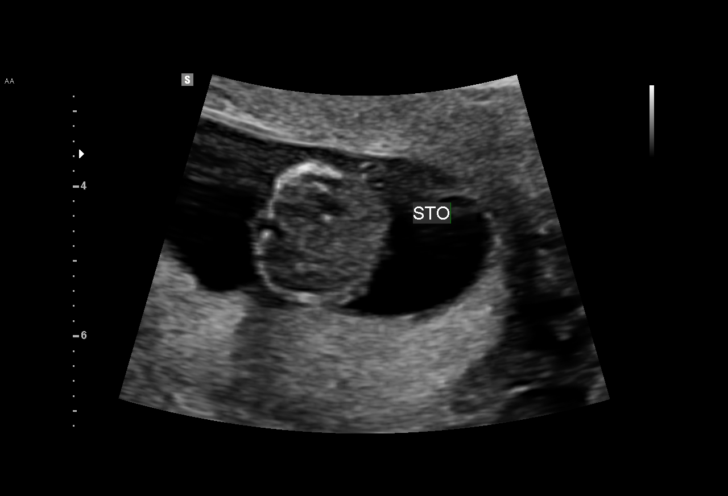
[im 10/33]
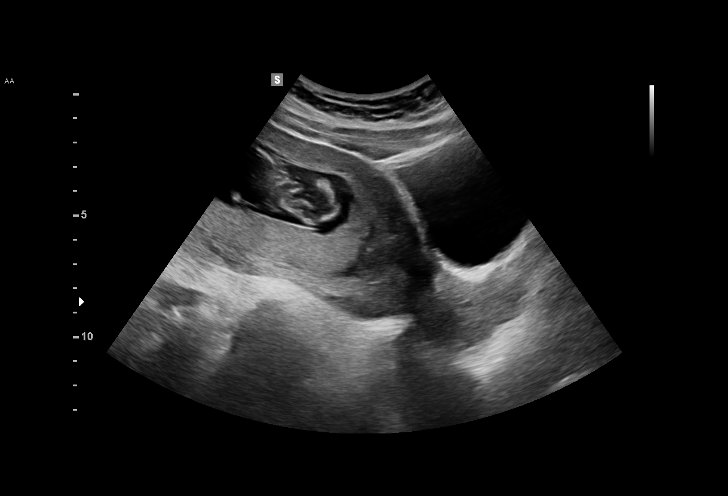
[im 12/33]
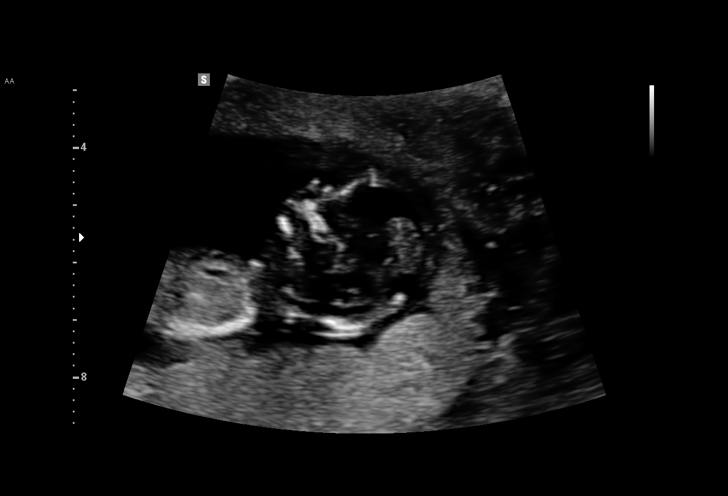
[im 15/33]
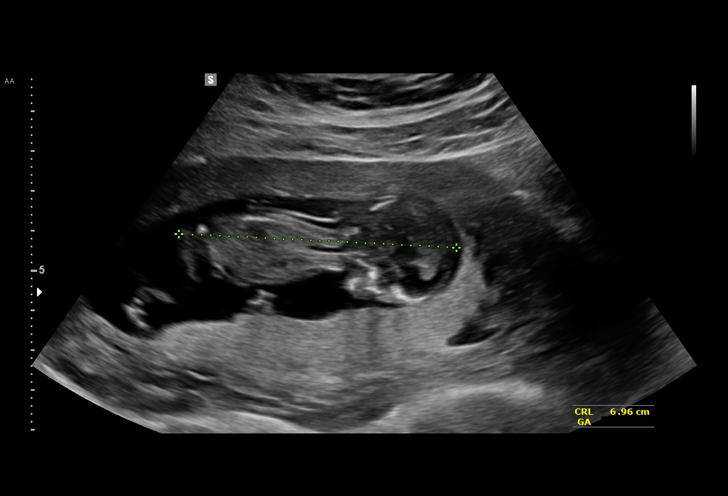
[im 17/33]
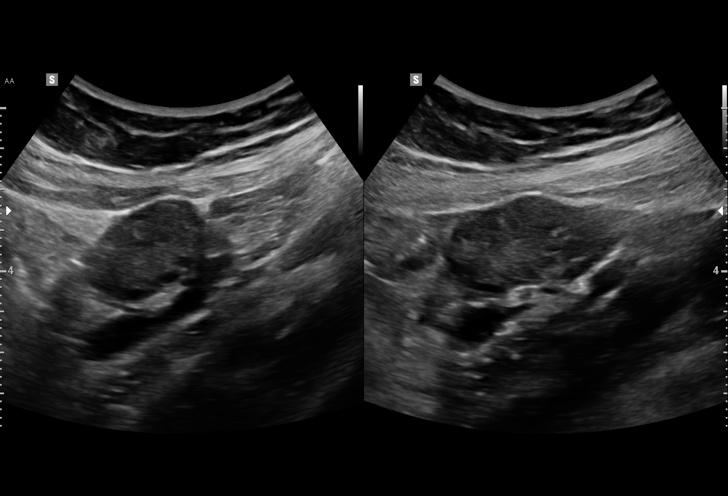
[im 18/33]
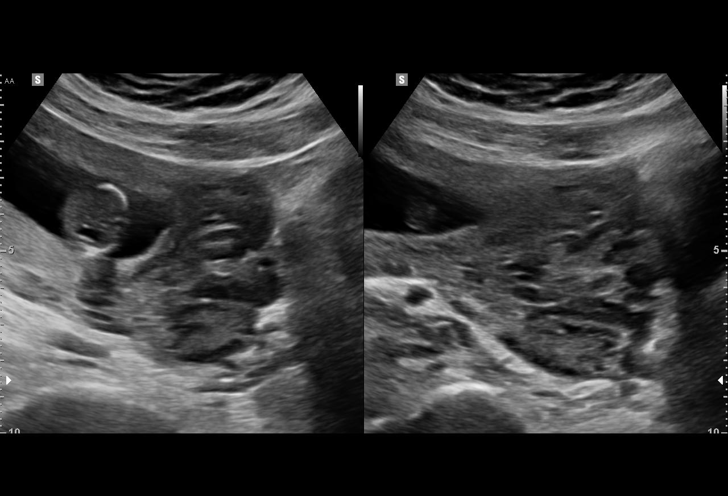
[im 21/33]
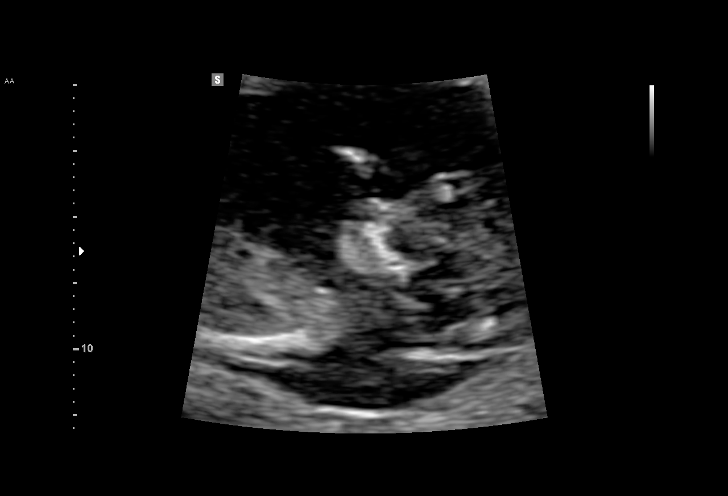
[im 23/33]
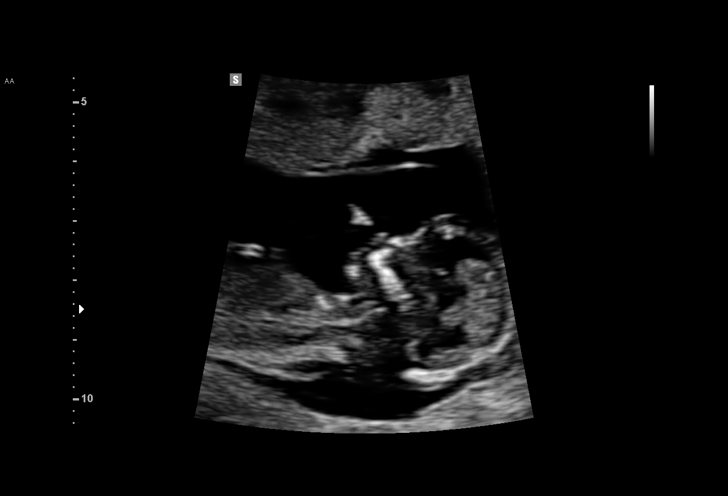
[im 25/33]
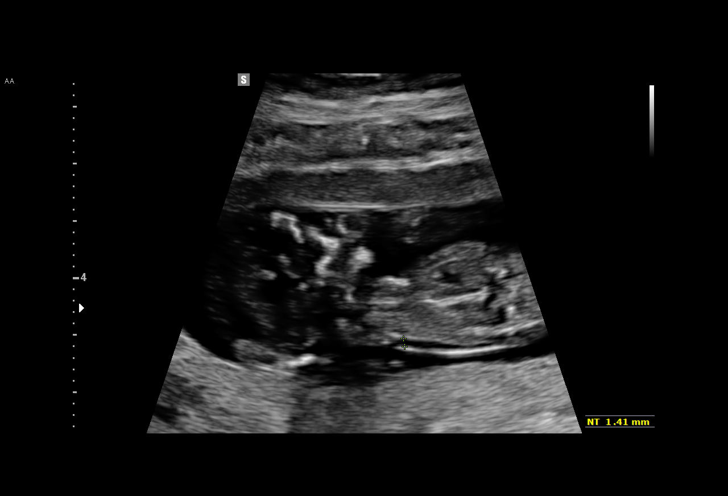
[im 28/33]
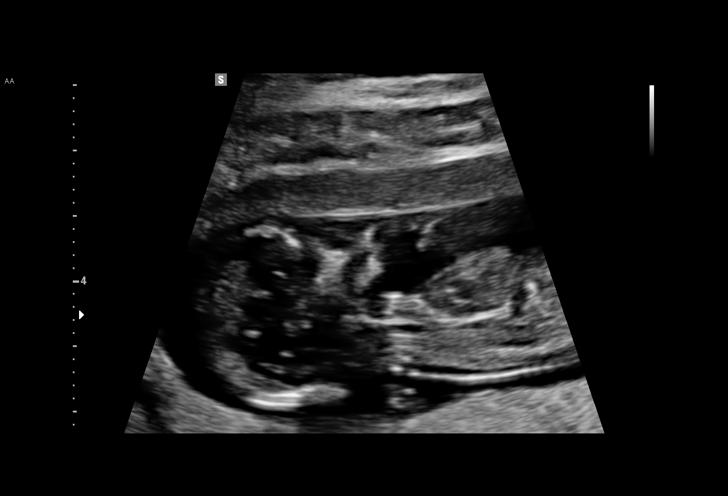
[im 30/33]
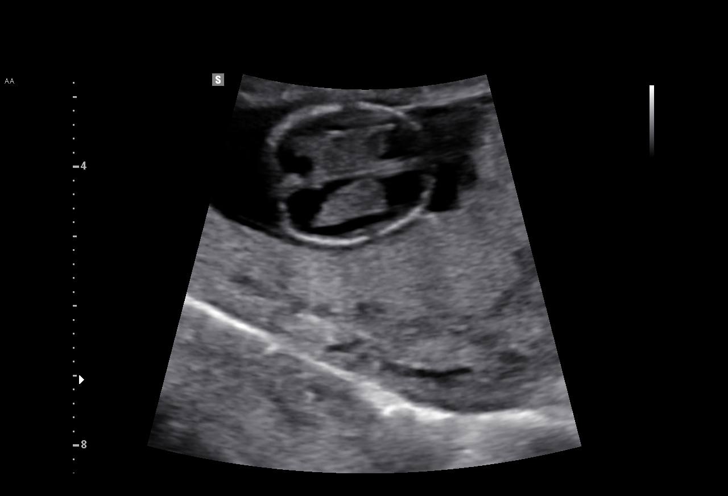
[im 33/33]
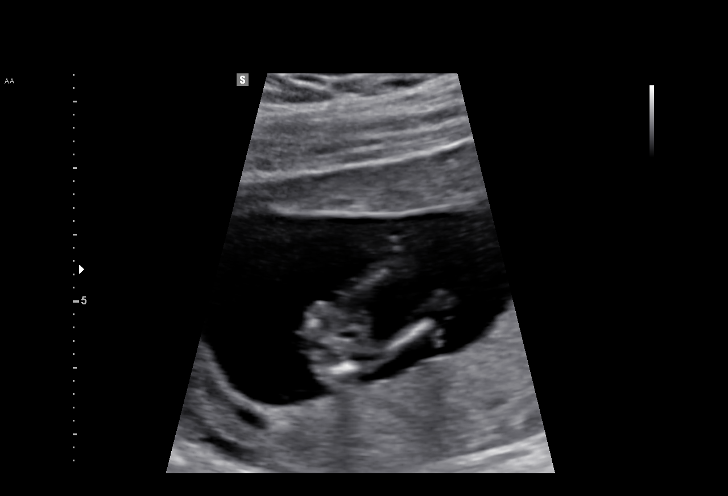

[15 of 28 positions shown; findings below may reference images not displayed]

[facility]

[facility]
[REDACTED]-
Faculty Physician

TRANSLUCENCY

1  DGHIM ZUKA                [id number]      [id number]     [id number]
Indications

12 weeks gestation of pregnancy
First trimester aneuploidy screen (NT)         Z36
Diabetes - Pregestational, 1st trimester
Hypothyroid
OB History

Blood Type:            Height:         Weight (lb):  153.8     BMI:
Gravidity:    4         Term:   1         SAB:   2
Living:       1
Fetal Evaluation

Num Of Fetuses:     1
Fetal Heart         165
Rate(bpm):
Cardiac Activity:   Observed

Amniotic Fluid
AFI FV:      Subjectively within normal limits
Biometry

CRL:      67.9  mm     G. Age:  12w 6d                   EDD:   05/16/16
NT:       1.41  mm
Gestational Age

Best:          12w 3d     Det. By:  Early Ultrasound         EDD:    05/19/16
1st Trimester Genetic Sonogram Screening

CRL:            67.9  mm    G. Age:   12w 6d                 EDD:    05/16/16
Nuc Trans:       1.7  mm

Nasal Bone:                 Present
Anatomy

Cranium:               Appears normal         Bladder:                Appears normal
Choroid Plexus:        Appears normal         Upper Extremities:      Noted
Stomach:               Appears normal, left   Lower Extremities:      Noted
sided
Impression

SIUP at 12+3 weeks
No gross abnormalities identified
NT measurement was within normal limits for this GA; NB
present
Normal amniotic fluid volume
Measurements consistent with early US
Recommendations

Offer MSAFP in the second trimester for ONTD screening
Offer anatomy U/S by 18 weeks

## 2015-11-12 ENCOUNTER — Encounter: Payer: Self-pay | Admitting: Obstetrics and Gynecology

## 2015-11-12 ENCOUNTER — Ambulatory Visit (INDEPENDENT_AMBULATORY_CARE_PROVIDER_SITE_OTHER): Payer: Medicaid Other | Admitting: Obstetrics and Gynecology

## 2015-11-12 VITALS — BP 136/87 | HR 72 | Wt 152.0 lb

## 2015-11-12 DIAGNOSIS — E039 Hypothyroidism, unspecified: Secondary | ICD-10-CM

## 2015-11-12 DIAGNOSIS — O10912 Unspecified pre-existing hypertension complicating pregnancy, second trimester: Secondary | ICD-10-CM

## 2015-11-12 DIAGNOSIS — O99282 Endocrine, nutritional and metabolic diseases complicating pregnancy, second trimester: Secondary | ICD-10-CM

## 2015-11-12 DIAGNOSIS — E038 Other specified hypothyroidism: Secondary | ICD-10-CM

## 2015-11-12 DIAGNOSIS — O24912 Unspecified diabetes mellitus in pregnancy, second trimester: Secondary | ICD-10-CM | POA: Diagnosis not present

## 2015-11-12 DIAGNOSIS — Z8759 Personal history of other complications of pregnancy, childbirth and the puerperium: Secondary | ICD-10-CM

## 2015-11-12 DIAGNOSIS — O0992 Supervision of high risk pregnancy, unspecified, second trimester: Secondary | ICD-10-CM

## 2015-11-12 DIAGNOSIS — E119 Type 2 diabetes mellitus without complications: Secondary | ICD-10-CM | POA: Diagnosis not present

## 2015-11-12 DIAGNOSIS — O99280 Endocrine, nutritional and metabolic diseases complicating pregnancy, unspecified trimester: Principal | ICD-10-CM

## 2015-11-12 LAB — POCT URINALYSIS DIP (DEVICE)
BILIRUBIN URINE: NEGATIVE
Glucose, UA: NEGATIVE mg/dL
KETONES UR: NEGATIVE mg/dL
NITRITE: NEGATIVE
PH: 5.5 (ref 5.0–8.0)
PROTEIN: NEGATIVE mg/dL
Specific Gravity, Urine: 1.025 (ref 1.005–1.030)
Urobilinogen, UA: 0.2 mg/dL (ref 0.0–1.0)

## 2015-11-12 LAB — TSH: TSH: 1.48 m[IU]/L

## 2015-11-12 MED ORDER — ASPIRIN EC 81 MG PO TBEC: 81.0000 mg | DELAYED_RELEASE_TABLET | Freq: Every day | ORAL | Status: DC

## 2015-11-12 NOTE — Progress Notes (Signed)
Regina Jones used for interpreter  Tip of week discussed

## 2015-11-12 NOTE — Progress Notes (Signed)
Subjective:  Regina Jones is a 25 y.o. [phone] at 2031w0d being seen today for ongoing prenatal care.  She is currently monitored for the following issues for this high-risk pregnancy and has Diabetes mellitus without complication (HCC); Hypothyroidism; Hypothyroidism affecting pregnancy; Diabetes mellitus affecting pregnancy (HCC); History of gestational hypertension; Supervision of high-risk pregnancy; and Chronic hypertension in pregnancy on her problem list.  Patient reports no complaints.  Contractions: Not present. Vag. Bleeding: None.  Movement: Absent. Denies leaking of fluid.   The following portions of the patient's history were reviewed and updated as appropriate: allergies, current medications, past family history, past medical history, past social history, past surgical history and problem list. Problem list updated.  Objective:   Filed Vitals:   11/12/15 1113 11/12/15 1116  BP: 152/86 136/87  Pulse: 72   Weight: 152 lb (68.947 kg)     Fetal Status: Fetal Heart Rate (bpm): 161   Movement: Absent     General:  Alert, oriented and cooperative. Patient is in no acute distress.  Skin: Skin is warm and dry. No rash noted.   Cardiovascular: Normal heart rate noted  Respiratory: Normal respiratory effort, no problems with respiration noted  Abdomen: Soft, gravid, appropriate for gestational age. Pain/Pressure: Present     Pelvic: Vag. Bleeding: None     Cervical exam deferred        Extremities: Normal range of motion.  Edema: None  Mental Status: Normal mood and affect. Normal behavior. Normal judgment and thought content.   Urinalysis: Urine Protein: Negative Urine Glucose: Negative  Assessment and Plan:  Pregnancy: G4P1021 at 2431w0d  1. Hypothyroidism affecting pregnancy Continue Synthroid Will check TSH - TSH  2. Diabetes mellitus affecting pregnancy, second trimester (HCC) Continue Metformin and diet CBGs reviewed and all within range  3. Supervision of  high-risk pregnancy, second trimester Continue current care  4. History of gestational hypertension   5. Chronic hypertension in pregnancy, second trimester Continue monitoring BP No meds yet  6. Diabetes mellitus without complication (HCC)   7. Other specified hypothyroidism   General obstetric precautions including but not limited to vaginal bleeding, contractions, leaking of fluid and fetal movement were reviewed in detail with the patient. Please refer to After Visit Summary for other counseling recommendations.  Return in about 3 weeks (around 12/03/2015).   Catalina AntiguaPeggy Kinnick Maus, MD

## 2015-11-14 ENCOUNTER — Other Ambulatory Visit (HOSPITAL_COMMUNITY): Payer: Self-pay

## 2015-12-06 ENCOUNTER — Encounter: Payer: Self-pay | Admitting: Obstetrics and Gynecology

## 2015-12-24 ENCOUNTER — Other Ambulatory Visit: Payer: Self-pay

## 2015-12-24 ENCOUNTER — Telehealth: Payer: Self-pay

## 2015-12-24 MED ORDER — LEVOTHYROXINE SODIUM 25 MCG PO TABS: 12.5000 ug | ORAL_TABLET | Freq: Every day | ORAL | Status: DC

## 2015-12-24 MED ORDER — PRENATAL VITAMINS PLUS 27-1 MG PO TABS: 1.0000 | ORAL_TABLET | Freq: Once | ORAL | Status: DC

## 2015-12-24 NOTE — Telephone Encounter (Signed)
Medicine has been called into patients pharmacy

## 2015-12-24 NOTE — Telephone Encounter (Signed)
Patient came in to clinic needs a refill on hey Thyroid 25 mg and her prenatal vitamins. [facility] Thanks. Only speaks spanish please let me know when it's called in so I can call patiennt. Thank you!

## 2015-12-26 ENCOUNTER — Other Ambulatory Visit: Payer: Self-pay

## 2015-12-26 MED ORDER — PRENATAL VITAMINS PLUS 27-1 MG PO TABS: 1.0000 | ORAL_TABLET | Freq: Once | ORAL | Status: AC

## 2015-12-26 NOTE — Telephone Encounter (Signed)
Pt requested we call in her prenatal vitamins. I have call them in to her pharmacy of choice.

## 2015-12-31 ENCOUNTER — Ambulatory Visit (INDEPENDENT_AMBULATORY_CARE_PROVIDER_SITE_OTHER): Payer: Self-pay | Admitting: Family Medicine

## 2015-12-31 ENCOUNTER — Encounter: Payer: Self-pay | Admitting: Clinical

## 2015-12-31 VITALS — BP 130/80 | HR 80 | Wt 156.2 lb

## 2015-12-31 DIAGNOSIS — O24912 Unspecified diabetes mellitus in pregnancy, second trimester: Secondary | ICD-10-CM

## 2015-12-31 DIAGNOSIS — O0992 Supervision of high risk pregnancy, unspecified, second trimester: Secondary | ICD-10-CM

## 2015-12-31 DIAGNOSIS — O99280 Endocrine, nutritional and metabolic diseases complicating pregnancy, unspecified trimester: Secondary | ICD-10-CM

## 2015-12-31 DIAGNOSIS — E039 Hypothyroidism, unspecified: Secondary | ICD-10-CM

## 2015-12-31 DIAGNOSIS — O10912 Unspecified pre-existing hypertension complicating pregnancy, second trimester: Secondary | ICD-10-CM

## 2015-12-31 LAB — POCT URINALYSIS DIP (DEVICE)
BILIRUBIN URINE: NEGATIVE
GLUCOSE, UA: NEGATIVE mg/dL
Hgb urine dipstick: NEGATIVE
KETONES UR: NEGATIVE mg/dL
Leukocytes, UA: NEGATIVE
Nitrite: NEGATIVE
Protein, ur: NEGATIVE mg/dL
Specific Gravity, Urine: 1.015 (ref 1.005–1.030)
Urobilinogen, UA: 0.2 mg/dL (ref 0.0–1.0)
pH: 6.5 (ref 5.0–8.0)

## 2015-12-31 NOTE — Progress Notes (Signed)
Subjective:  Regina Jones is a 25 y.o. [phone] at 2717w0d being seen today for ongoing prenatal care.  She is currently monitored for the following issues for this high-risk pregnancy and has Diabetes mellitus without complication (HCC); Hypothyroidism; Hypothyroidism affecting pregnancy; Diabetes mellitus affecting pregnancy (HCC); History of gestational hypertension; Supervision of high-risk pregnancy; and Chronic hypertension in pregnancy on her problem list.  Patient reports no complaints.  Movement: Present. Denies leaking of fluid.   The following portions of the patient's history were reviewed and updated as appropriate: allergies, current medications, past family history, past medical history, past social history, past surgical history and problem list. Problem list updated.  Objective:   Filed Vitals:   12/31/15 0806  BP: 130/80  Pulse: 80  Weight: 156 lb 3.2 oz (70.852 kg)    Fetal Status: Fetal Heart Rate (bpm): 138   Movement: Present     General:  Alert, oriented and cooperative. Patient is in no acute distress.  Skin: Skin is warm and dry. No rash noted.   Cardiovascular: Normal heart rate noted  Respiratory: Normal respiratory effort, no problems with respiration noted  Abdomen: Soft, gravid, appropriate for gestational age. Pain/Pressure: Present     Pelvic: Cervical exam deferred        Extremities: Normal range of motion.  Edema: None  Mental Status: Normal mood and affect. Normal behavior. Normal judgment and thought content.   Urinalysis: Urine Protein: Negative Urine Glucose: Negative FBS 81-90 2 hr pp 111-125 (1 out of range) Assessment and Plan:  Pregnancy: G4P1021 at 2717w0d  1. Diabetes mellitus affecting pregnancy, second trimester (HCC) Fetal ECHO and Optho referral - Ambulatory referral to Pediatric Cardiology - Ambulatory referral to Ophthalmology  2. Hypothyroidism affecting pregnancy Nml TSH --recheck q trimester  3. Supervision of  high-risk pregnancy, second trimester Continue prenatal care Needs anatomy - US MFM OB DETAIL +14 WK; Future - Alpha fetoprotein, maternal  4. Chronic hypertension in pregnancy, second trimester BP is well controlled  General obstetric precautions including but not limited to vaginal bleeding, contractions, leaking of fluid and fetal movement were reviewed in detail with the patient. Please refer to After Visit Summary for other counseling recommendations.  Return in 3 weeks (on 01/21/2016).   Reva Boresanya S Pratt, MD

## 2015-12-31 NOTE — Patient Instructions (Signed)
Diabetes mellitus gestacional (Gestational Diabetes Mellitus) La diabetes mellitus gestacional, ms comnmente conocida como diabetes gestacional es un tipo de diabetes que desarrollan algunas mujeres durante el embarazo. En la diabetes gestacional, el pncreas no produce suficiente insulina (una hormona) o las clulas son menos sensibles a la insulina producida (resistencia a la insulina), o ambas cosas. Normalmente, la insulina mueve los azcares de los alimentos a las clulas de los tejidos. Las clulas de los tejidos utilizan los azcares para obtener energa. La falta de insulina o la falta de una respuesta normal a la insulina hace que el exceso de azcar se acumule en la sangre en lugar de penetrar en las clulas de los tejidos. Como resultado, se producen niveles altos de azcar en la sangre (hiperglucemia). El efecto de los niveles altos de azcar (glucosa) puede causar muchos problemas.  FACTORES DE RIESGO Usted tiene mayor probabilidad de desarrollar diabetes gestacional si tiene antecedentes familiares de diabetes y tambin si tiene uno o ms de los siguientes factores de riesgo:  ndice de masa corporal superior a 30 (obesidad).  Embarazo previo con diabetes gestacional.  La edad avanzada en el momento del embarazo. Si se mantienen los niveles de glucosa en la sangre en un rango normal durante el embarazo, las mujeres pueden tener un embarazo saludable. Si los niveles de glucosa en la sangre no estn bien controlados, puede haber riesgos para usted, el feto o el recin nacido, o durante el trabajo de parto y el parto.  SNTOMAS  Si se presentan sntomas, stos son similares a los sntomas que normalmente experimentar durante el embarazo. Los sntomas de la diabetes gestacional son:   Aumento de la sed (polidipsia).  Aumento de la miccin (poliuria).  Orina con ms frecuencia durante la noche (nocturia).  Prdida de peso. La prdida de peso puede ser muy rpida.  Infecciones  frecuentes y recurrentes.  Cansancio (fatiga).  Debilidad.  Cambios en la visin, como visin borrosa.  Olor a fruta en el aliento.  Dolor abdominal. DIAGNSTICO La diabetes se diagnostica cuando hay aumento de los niveles de glucosa en la sangre. El nivel de glucosa en la sangre puede controlarse en uno o ms de los siguientes anlisis de sangre:  Medicin de glucosa en la sangre en ayunas. No se le permitir comer durante al menos 8 horas antes de que se tome una muestra de sangre.  Pruebas al azar de glucosa en la sangre. El nivel de glucosa en la sangre se controla en cualquier momento del da sin importar el momento en que haya comido.  Prueba de tolerancia a la glucosa oral (PTGO). La glucosa en la sangre se mide despus de no haber comido (ayunas) durante una a tres horas y despus de beber una bebida que contenga glucosa. Dado que las hormonas que causan la resistencia a la insulina son ms altas alrededor de las semanas 24 a 28 de embarazo, generalmente se realiza una PTGO durante ese tiempo. Si tiene factores de riesgo, en la primera visita prenatal pueden hacerle pruebas de deteccin de diabetes tipo 2 no diagnosticada. TRATAMIENTO  La diabetes gestacional debe controlarse en primer lugar con dieta y ejercicios. Pueden agregarse medicamentos, pero solo si son necesarios.  Usted tendr que tomar medicamentos para la diabetes o insulina diariamente para mantener los niveles de glucosa en la sangre en el rango deseado.  Usted tendr que combinar la dosis de insulina con la actividad fsica y la eleccin de alimentos saludables. Si tiene diabetes gestacional, el objetivo del tratamiento ser   mantener los siguientes niveles sanguneos de glucosa:  Antes de las comidas (preprandial): valor de 95 mg/dl o inferior.  Despus de las comidas (posprandial):  Una hora despus de la comida: valor de 140 mg/dl o inferior.  Dos horas despus de la comida: valor de 120 mg/dl o  inferior. Si tiene diabetes tipo 1 o tipo 2 preexistente, el objetivo del tratamiento ser mantener los siguientes niveles sanguneos de glucosa:  Antes de las comidas, a la hora de acostarse y durante la noche: de 60 a 99 mg/dl.  Despus de las comidas: valor mximo de 100 a 129 mg/dl. INSTRUCCIONES PARA EL CUIDADO EN EL HOGAR   Controle su nivel de hemoglobina A1c dos veces al ao.  Contrlese a diario el nivel de glucosa en la sangre segn las indicaciones de su mdico. Es comn realizar controles frecuentes de la glucosa en la sangre.  Supervise las cetonas en la orina cuando est enferma y segn las indicaciones de su mdico.  Tome el medicamento para la diabetes y adminstrese insulina segn las indicaciones de su mdico para mantener el nivel de glucosa en la sangre en el rango deseado.  Nunca se quede sin medicamento para la diabetes o sin insulina. Es necesario que la reciba todos los das.  Ajuste la insulina segn la ingesta de hidratos de carbono. Los hidratos de carbono pueden aumentar los niveles de glucosa en la sangre, pero deben incluirse en su dieta. Los hidratos de carbono aportan vitaminas, minerales y fibra que son una parte esencial de una dieta saludable. Los hidratos de carbono se encuentran en frutas, verduras, cereales integrales, productos lcteos, legumbres y alimentos que contienen azcares aadidos.  Consuma alimentos saludables. Alterne 3 comidas con 3 colaciones.  Aumente de peso saludablemente. El aumento del peso total vara de acuerdo con el ndice de masa corporal que tena antes del embarazo ([facility]).  Lleve una tarjeta de alerta mdica o use una pulsera o medalla de alerta mdica.  Lleve con usted una colacin de 15gramos de hidratos de carbono en todo momento para controlar los niveles bajos de glucosa en la sangre (hipoglucemia). Algunos ejemplos de colaciones de 15gramos de hidratos de carbono son los siguientes:  Tabletas de glucosa, 3 o 4.  Gel  de glucosa, tubo de 15 gramos.  Pasas de uva, 2 cucharadas (24 g).  Caramelos de goma, 6.  Galletas de animales, 8.  Jugo de fruta, gaseosa comn, o leche descremada, 4 onzas (120 ml).  Pastillas de goma, 9.  Reconocer la hipoglucemia. Durante el embarazo la hipoglucemia se produce cuando hay niveles de glucosa en la sangre de 60 mg/dl o menos. El riesgo de hipoglucemia aumenta durante el ayuno o cuando se saltea las comidas, durante o despus de realizar ejercicio intenso y mientras duerme. Los sntomas de hipoglucemia son:  Temblores o sacudidas.  Disminucin de la capacidad de concentracin.  Sudoracin.  Aumento de la frecuencia cardaca.  Dolor de cabeza.  Sequedad en la boca.  Hambre.  Irritabilidad.  Ansiedad.  Sueo agitado.  Alteracin del habla o de la coordinacin.  Confusin.  Tratar la hipoglucemia rpidamente. Si usted est alerta y puede tragar con seguridad, siga la regla de 15/15 que consiste en:  Tome entre 15 y 20gramos de glucosa de accin rpida o carbohidratos. Las opciones de accin rpida son un gel de glucosa, tabletas de glucosa, o 4 onzas (120 ml) de jugo de frutas, gaseosa comn, o leche baja en grasa.  Compruebe su nivel de glucosa en la sangre   15 minutos despus de tomar la glucosa.  Tome entre 15 y 20 gramos ms de glucosa si el nivel de glucosa en la sangre todava es de 70mg/dl o inferior.  Ingiera una comida o una colacin en el lapso de 1 hora una vez que los niveles de glucosa en la sangre vuelven a la normalidad.  Est atento a la poliuria (miccin excesiva) y la polidipsia (sensacin de mucha sed), que son los primeros signos de la hiperglucemia. El reconocimiento temprano de la hiperglucemia permite un tratamiento oportuno. Trate la hiperglucemia segn le indic su mdico.  Haga actividad fsica por lo menos 30minutos al da o como lo indique su mdico. Se recomienda que 30 minutos despus de cada comida, realice diez minutos  de actividad fsica para controlar los niveles de glucosa postprandial en la sangre.  Ajuste su dosis de insulina y la ingesta de alimentos, segn sea necesario, si inicia un nuevo ejercicio o deporte.  Siga su plan para los das de enfermedad cuando no pueda comer o beber como de costumbre.  Evite el tabaco y el alcohol.  Concurra a todas las visitas de control como se lo haya indicado el mdico.  Siga el consejo del mdico respecto a los controles prenatales y posteriores al parto (postparto), las visitas, la planificacin de las comidas, el ejercicio, los medicamentos, las vitaminas, los anlisis de sangre, otras pruebas mdicas y actividades fsicas.  Realice diariamente el cuidado de la piel y de los pies. Examine su piel y los pies diariamente para ver si tiene cortes, moretones, enrojecimiento, problemas en las uas, sangrado, ampollas o llagas.  Cepllese los dientes y encas por lo menos dos veces al da y use hilo dental al menos una vez por da. Concurra regularmente a las visitas de control con el dentista.  Programe un examen de vista durante el primer trimestre de su embarazo o como lo indique su mdico.  Comparta su plan de control de diabetes en el trabajo o en la escuela.  Mantngase al da con las vacunas.  Aprenda a manejar el estrs.  Obtenga la mayor cantidad posible de informacin sobre la diabetes y solicite ayuda siempre que sea necesario.  Obtenga informacin sobre el amamantamiento y analice esta posibilidad.  Debe controlar el nivel de azcar en la sangre de 6a 12semanas despus del parto. Esto se hace con una prueba de tolerancia a la glucosa oral (PTGO). SOLICITE ATENCIN MDICA SI:   No puede comer alimentos o beber por ms de 6 horas.  Tuvo nuseas o ha vomitado durante ms de 6 horas.  Tiene un nivel de glucosa en la sangre de 200 mg/dl y cetonas en la orina.  Presenta algn cambio en el estado mental.  Desarrolla problemas de visin.  Sufre  un dolor persistente de cabeza.  Siente dolor o molestias en la parte superior del abdomen.  Desarrolla una enfermedad grave adicional.  Tuvo diarrea durante ms de 6 horas.  Ha estado enfermo o ha tenido fiebre durante un par de das y no mejora. SOLICITE ATENCIN MDICA DE INMEDIATO SI:   Tiene dificultad para respirar.  Ya no siente los movimientos del beb.  Est sangrando o tiene flujo vaginal.  Comienza a tener contracciones o trabajo de parto prematuro. ASEGRESE DE QUE:  Comprende estas instrucciones.  Controlar su afeccin.  Recibir ayuda de inmediato si no mejora o si empeora.   Esta informacin no tiene como fin reemplazar el consejo del mdico. Asegrese de hacerle al mdico cualquier pregunta que tenga.     Document Released: 04/02/2005 Document Revised: 07/14/2014 Elsevier Interactive Patient Education 2016 [employer]  Lactancia materna (Breastfeeding) Decidir amamantar es una de las mejores elecciones que puede hacer por usted y su beb. El cambio hormonal durante el embarazo produce el desarrollo del tejido mamario y aumenta la cantidad y el tamao de los conductos galactforos. Estas hormonas tambin permiten que las protenas, los azcares y las grasas de la sangre produzcan la leche materna en las glndulas productoras de leche. Las hormonas impiden que la leche materna sea liberada antes del nacimiento del beb, adems de impulsar el flujo de leche luego del nacimiento. Una vez que ha comenzado a amamantar, pensar en el beb, as como la succin o el llanto, pueden estimular la liberacin de leche de las glndulas productoras de leche.  LOS BENEFICIOS DE AMAMANTAR Para el beb  La primera leche (calostro) ayuda a mejorar el funcionamiento del sistema digestivo del beb.  La leche tiene anticuerpos que ayudan a prevenir las infecciones en el beb.  El beb tiene una menor incidencia de asma, alergias y del sndrome de muerte sbita del lactante.  Los  nutrientes en la leche materna son mejores para el beb que la leche maternizada y estn preparados exclusivamente para cubrir las necesidades del beb.  La leche materna mejora el desarrollo cerebral del beb.  Es menos probable que el beb desarrolle otras enfermedades, como obesidad infantil, asma o diabetes mellitus de tipo 2. Para usted   La lactancia materna favorece el desarrollo de un vnculo muy especial entre la madre y el beb.  Es conveniente. La leche materna siempre est disponible a la temperatura correcta y es econmica.  La lactancia materna ayuda a quemar caloras y a perder el peso ganado durante el embarazo.  Favorece la contraccin del tero al tamao que tena antes del embarazo de manera ms rpida y disminuye el sangrado (loquios) despus del parto.  La lactancia materna contribuye a reducir el riesgo de desarrollar diabetes mellitus de tipo 2, osteoporosis o cncer de mama o de ovario en el futuro. SIGNOS DE QUE EL BEB EST HAMBRIENTO Primeros signos de hambre  Aumenta su estado de alerta o actividad.  Se estira.  Mueve la cabeza de un lado a otro.  Mueve la cabeza y abre la boca cuando se le toca la mejilla o la comisura de la boca (reflejo de bsqueda).  Aumenta las vocalizaciones, tales como sonidos de succin, se relame los labios, emite arrullos, suspiros, o chirridos.  Mueve la mano hacia la boca.  Se chupa con ganas los dedos o las manos. Signos tardos de hambre  Est agitado.  Llora de manera intermitente. Signos de hambre extrema Los signos de hambre extrema requerirn que lo calme y lo consuele antes de que el beb pueda alimentarse adecuadamente. No espere a que se manifiesten los siguientes signos de hambre extrema para comenzar a amamantar:   Agitacin.  Llanto intenso y fuerte.  Gritos. INFORMACIN BSICA SOBRE LA LACTANCIA MATERNA Iniciacin de la lactancia materna  Encuentre un lugar cmodo para sentarse o acostarse, con un  buen respaldo para el cuello y la espalda.  Coloque una almohada o una manta enrollada debajo del beb para acomodarlo a la altura de la mama (si est sentada). Las almohadas para amamantar se han diseado especialmente a fin de servir de apoyo para los brazos y el beb mientras amamanta.  Asegrese de que el abdomen del beb est frente al suyo.   Masajee suavemente la mama. Con las yemas   de los dedos, masajee la pared del pecho hacia el pezn en un movimiento circular. Esto estimula el flujo de leche. Es posible que deba continuar este movimiento mientras amamanta si la leche fluye lentamente.  Sostenga la mama con el pulgar por arriba del pezn y los otros 4 dedos por debajo de la mama. Asegrese de que los dedos se encuentren lejos del pezn y de la boca del beb.  Empuje suavemente los labios del beb con el pezn o con el dedo.  Cuando la boca del beb se abra lo suficiente, acrquelo rpidamente a la mama e introduzca todo el pezn y la zona oscura que lo rodea (areola), tanto como sea posible, dentro de la boca del beb.  Debe haber ms areola visible por arriba del labio superior del beb que por debajo del labio inferior.  La lengua del beb debe estar entre la enca inferior y la mama.  Asegrese de que la boca del beb est en la posicin correcta alrededor del pezn (prendida). Los labios del beb deben crear un sello sobre la mama y estar doblados hacia afuera (invertidos).  Es comn que el beb succione durante 2 a 3 minutos para que comience el flujo de leche materna. Cmo debe prenderse Es muy importante que le ensee al beb cmo prenderse adecuadamente a la mama. Si el beb no se prende adecuadamente, puede causarle dolor en el pezn y reducir la produccin de leche materna, y hacer que el beb tenga un escaso aumento de peso. Adems, si el beb no se prende adecuadamente al pezn, puede tragar aire durante la alimentacin. Esto puede causarle molestias al beb. Hacer eructar  al beb al cambiar de mama puede ayudarlo a liberar el aire. Sin embargo, ensearle al beb cmo prenderse a la mama adecuadamente es la mejor manera de evitar que se sienta molesto por tragar aire mientras se alimenta. Signos de que el beb se ha prendido adecuadamente al pezn:   Tironea o succiona de modo silencioso, sin causarle dolor.  Se escucha que traga cada 3 o 4 succiones.  Hay movimientos musculares por arriba y por delante de sus odos al succionar. Signos de que el beb no se ha prendido adecuadamente al pezn:   Hace ruidos de succin o de chasquido mientras se alimenta.  Siente dolor en el pezn. Si cree que el beb no se prendi correctamente, deslice el dedo en la comisura de la boca y colquelo entre las encas del beb para interrumpir la succin. Intente comenzar a amamantar nuevamente. Signos de lactancia materna exitosa Signos del beb:   Disminuye gradualmente el nmero de succiones o cesa la succin por completo.  Se duerme.  Relaja el cuerpo.  Retiene una pequea cantidad de leche en la boca.  Se desprende solo del pecho. Signos que presenta usted:  Las mamas han aumentado la firmeza, el peso y el tamao 1 a 3 horas despus de amamantar.  Estn ms blandas inmediatamente despus de amamantar.  Un aumento del volumen de leche, y tambin un cambio en su consistencia y color se producen hacia el quinto da de lactancia materna.  Los pezones no duelen, ni estn agrietados ni sangran. Signos de que su beb recibe la cantidad de leche suficiente  Moja al menos 3 paales en 24 horas. La orina debe ser clara y de color amarillo plido a los 5 das de vida.  Defeca al menos 3 veces en 24 horas a los 5 das de vida. La materia fecal debe ser   blanda y amarillenta.  Defeca al menos 3 veces en 24 horas a los 7 das de vida. La materia fecal debe ser grumosa y amarillenta.  No registra una prdida de peso mayor del 10% del peso al nacer durante los primeros 3  das de vida.  Aumenta de peso un promedio de 4 a 7onzas (113 a 198g) por semana despus de los 4 das de vida.  Aumenta de peso, diariamente, de manera uniforme a partir de los 5 das de vida, sin registrar prdida de peso despus de las 2semanas de vida. Despus de alimentarse, es posible que el beb regurgite una pequea cantidad. Esto es frecuente. FRECUENCIA Y DURACIN DE LA LACTANCIA MATERNA El amamantamiento frecuente la ayudar a producir ms leche y a prevenir problemas de dolor en los pezones e hinchazn en las mamas. Alimente al beb cuando muestre signos de hambre o si siente la necesidad de reducir la congestin de las mamas. Esto se denomina "lactancia a demanda". Evite el uso del chupete mientras trabaja para establecer la lactancia (las primeras 4 a 6 semanas despus del nacimiento del beb). Despus de este perodo, podr ofrecerle un chupete. Las investigaciones demostraron que el uso del chupete durante el primer ao de vida del beb disminuye el riesgo de desarrollar el sndrome de muerte sbita del lactante (SMSL). Permita que el nio se alimente en cada mama todo lo que desee. Contine amamantando al beb hasta que haya terminado de alimentarse. Cuando el beb se desprende o se queda dormido mientras se est alimentando de la primera mama, ofrzcale la segunda. Debido a que, con frecuencia, los recin nacidos permanecen somnolientos las primeras semanas de vida, es posible que deba despertar al beb para alimentarlo. Los horarios de lactancia varan de un beb a otro. Sin embargo, las siguientes reglas pueden servir como gua para ayudarla a garantizar que el beb se alimenta adecuadamente:  Se puede amamantar a los recin nacidos (bebs de 4 semanas o menos de vida) cada 1 a 3 horas.  No deben transcurrir ms de 3 horas durante el da o 5 horas durante la noche sin que se amamante a los recin nacidos.  Debe amamantar al beb 8 veces como mnimo en un perodo de 24 horas,  hasta que comience a introducir slidos en su dieta, a los 6 meses de vida aproximadamente. EXTRACCIN DE LECHE MATERNA La extraccin y el almacenamiento de la leche materna le permiten asegurarse de que el beb se alimente exclusivamente de leche materna, aun en momentos en los que no puede amamantar. Esto tiene especial importancia si debe regresar al trabajo en el perodo en que an est amamantando o si no puede estar presente en los momentos en que el beb debe alimentarse. Su asesor en lactancia puede orientarla sobre cunto tiempo es seguro almacenar leche materna.  El sacaleche es un aparato que le permite extraer leche de la mama a un recipiente estril. Luego, la leche materna extrada puede almacenarse en un refrigerador o congelador. Algunos sacaleches son manuales, mientras que otros son elctricos. Consulte a su asesor en lactancia qu tipo ser ms conveniente para usted. Los sacaleches se pueden comprar; sin embargo, algunos hospitales y grupos de apoyo a la lactancia materna alquilan sacaleches mensualmente. Un asesor en lactancia puede ensearle cmo extraer leche materna manualmente, en caso de que prefiera no usar un sacaleche.  CMO CUIDAR LAS MAMAS DURANTE LA LACTANCIA MATERNA Los pezones se secan, agrietan y duelen durante la lactancia materna. Las siguientes recomendaciones pueden ayudarla a   mantener las mamas humectadas y sanas:  Evite usar jabn en los pezones.  Use un sostn de soporte. Aunque no son esenciales, las camisetas sin mangas o los sostenes especiales para amamantar estn diseados para acceder fcilmente a las mamas, para amamantar sin tener que quitarse todo el sostn o la camiseta. Evite usar sostenes con aro o sostenes muy ajustados.  Seque al aire sus pezones durante 3 a 4minutos despus de amamantar al beb.  Utilice solo apsitos de algodn en el sostn para absorber las prdidas de leche. La prdida de un poco de leche materna entre las tomas es  normal.  Utilice lanolina sobre los pezones luego de amamantar. La lanolina ayuda a mantener la humedad normal de la piel. Si usa lanolina pura, no tiene que lavarse los pezones antes de volver a alimentar al beb. La lanolina pura no es txica para el beb. Adems, puede extraer manualmente algunas gotas de leche materna y masajear suavemente esa leche sobre los pezones, para que la leche se seque al aire. Durante las primeras semanas despus de dar a luz, algunas mujeres pueden experimentar hinchazn en las mamas (congestin mamaria). La congestin puede hacer que sienta las mamas pesadas, calientes y sensibles al tacto. El pico de la congestin ocurre dentro de los 3 a 5 das despus del parto. Las siguientes recomendaciones pueden ayudarla a aliviar la congestin:  Vace por completo las mamas al amamantar o extraer leche. Puede aplicar calor hmedo en las mamas (en la ducha o con toallas hmedas para manos) antes de amamantar o extraer leche. Esto aumenta la circulacin y ayuda a que la leche fluya. Si el beb no vaca por completo las mamas cuando lo amamanta, extraiga la leche restante despus de que haya finalizado.  Use un sostn ajustado (para amamantar o comn) o una camiseta sin mangas durante 1 o 2 das para indicar al cuerpo que disminuya ligeramente la produccin de leche.  Aplique compresas de hielo sobre las mamas, a menos que le resulte demasiado incmodo.  Asegrese de que el beb est prendido y se encuentre en la posicin correcta mientras lo alimenta. Si la congestin persiste luego de 48 horas o despus de seguir estas recomendaciones, comunquese con su mdico o un asesor en lactancia. RECOMENDACIONES GENERALES PARA EL CUIDADO DE LA SALUD DURANTE LA LACTANCIA MATERNA  Consuma alimentos saludables. Alterne comidas y colaciones, y coma 3 de cada una por da. Dado que lo que come afecta la leche materna, es posible que algunas comidas hagan que su beb se vuelva ms irritable de  lo habitual. Evite comer este tipo de alimentos si percibe que afectan de manera negativa al beb.  Beba leche, jugos de fruta y agua para satisfacer su sed (aproximadamente 10 vasos al da).  Descanse con frecuencia, reljese y tome sus vitaminas prenatales para evitar la fatiga, el estrs y la anemia.  Contine con los autocontroles de la mama.  Evite masticar y fumar tabaco. Las sustancias qumicas de los cigarrillos que pasan a la leche materna y la exposicin al humo ambiental del tabaco pueden daar al beb.  No consuma alcohol ni drogas, incluida la marihuana. Algunos medicamentos, que pueden ser perjudiciales para el beb, pueden pasar a travs de la leche materna. Es importante que consulte a su mdico antes de tomar cualquier medicamento, incluidos todos los medicamentos recetados y de venta libre, as como los suplementos vitamnicos y herbales. Puede quedar embarazada durante la lactancia. Si desea controlar la natalidad, consulte a su mdico cules son   las opciones ms seguras para el beb. SOLICITE ATENCIN MDICA SI:   Usted siente que quiere dejar de amamantar o se siente frustrada con la lactancia.  Siente dolor en las mamas o en los pezones.  Sus pezones estn agrietados o sangran.  Sus pechos estn irritados, sensibles o calientes.  Tiene un rea hinchada en cualquiera de las mamas.  Siente escalofros o fiebre.  Tiene nuseas o vmitos.  Presenta una secrecin de otro lquido distinto de la leche materna de los pezones.  Sus mamas no se llenan antes de amamantar al beb para el quinto da despus del parto.  Se siente triste y deprimida.  El beb est demasiado somnoliento como para comer bien.  El beb tiene problemas para dormir.  Moja menos de 3 paales en 24 horas.  Defeca menos de 3 veces en 24 horas.  La piel del beb o la parte blanca de los ojos se vuelven amarillentas.  El beb no ha aumentado de peso a los 5 das de vida. SOLICITE ATENCIN  MDICA DE INMEDIATO SI:   El beb est muy cansado (letargo) y no se quiere despertar para comer.  Le sube la fiebre sin causa.   Esta informacin no tiene como fin reemplazar el consejo del mdico. Asegrese de hacerle al mdico cualquier pregunta que tenga.   Document Released: 06/23/2005 Document Revised: 03/14/2015 Elsevier Interactive Patient Education 2016 Elsevier Inc.  

## 2015-12-31 NOTE — Progress Notes (Signed)
U/S scheduled 06/27 @ 345pm. Fetal echo scheduled 7/14 @ 1130am. Optho appt scheduled 7/10 @ 330pm.

## 2016-01-01 ENCOUNTER — Ambulatory Visit (HOSPITAL_COMMUNITY)
Admission: RE | Admit: 2016-01-01 | Discharge: 2016-01-01 | Disposition: A | Discharge status: Home / Self Care | Payer: Medicaid Other | Source: Ambulatory Visit | Attending: Family Medicine | Admitting: Family Medicine

## 2016-01-01 ENCOUNTER — Encounter (HOSPITAL_COMMUNITY): Payer: Self-pay

## 2016-01-01 ENCOUNTER — Other Ambulatory Visit: Payer: Self-pay | Admitting: Family Medicine

## 2016-01-01 DIAGNOSIS — O24312 Unspecified pre-existing diabetes mellitus in pregnancy, second trimester: Secondary | ICD-10-CM | POA: Diagnosis not present

## 2016-01-01 DIAGNOSIS — Z3A20 20 weeks gestation of pregnancy: Secondary | ICD-10-CM

## 2016-01-01 DIAGNOSIS — Z1389 Encounter for screening for other disorder: Secondary | ICD-10-CM

## 2016-01-01 DIAGNOSIS — O0992 Supervision of high risk pregnancy, unspecified, second trimester: Secondary | ICD-10-CM

## 2016-01-01 DIAGNOSIS — Z36 Encounter for antenatal screening of mother: Secondary | ICD-10-CM | POA: Diagnosis present

## 2016-01-01 DIAGNOSIS — O99280 Endocrine, nutritional and metabolic diseases complicating pregnancy, unspecified trimester: Secondary | ICD-10-CM | POA: Insufficient documentation

## 2016-01-01 DIAGNOSIS — O10919 Unspecified pre-existing hypertension complicating pregnancy, unspecified trimester: Secondary | ICD-10-CM

## 2016-01-01 DIAGNOSIS — O10012 Pre-existing essential hypertension complicating pregnancy, second trimester: Secondary | ICD-10-CM | POA: Insufficient documentation

## 2016-01-01 DIAGNOSIS — E039 Hypothyroidism, unspecified: Secondary | ICD-10-CM | POA: Diagnosis not present

## 2016-01-01 IMAGING — US US MFM OB DETAIL+14 WK
1 series · 14 of 28 positions shown · non-contrast
Comparison: none

[Series 1: us mfm ob detail+14 wk · 75 acquisitions, 14 frames shown]
[im 3/75]
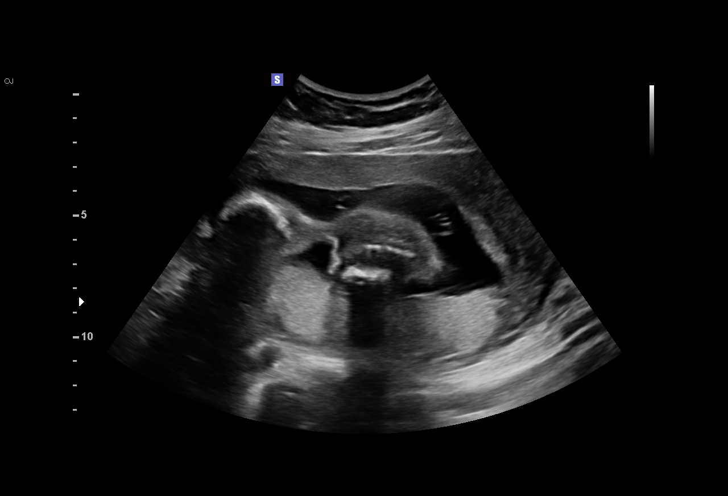
[im 9/75]
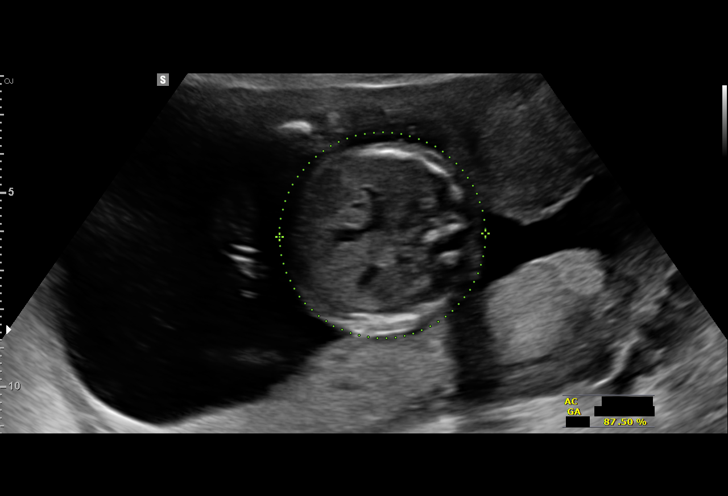
[im 14/75]
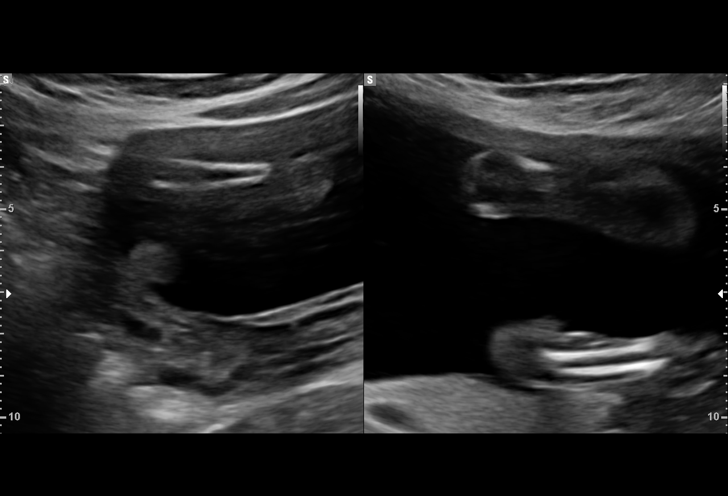
[im 20/75]
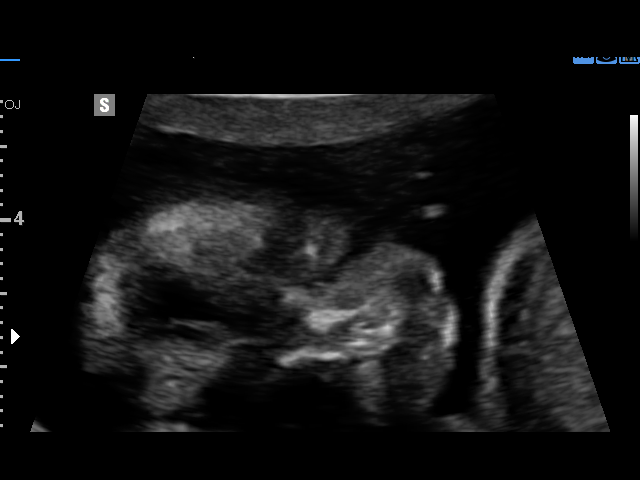
[im 25/75]
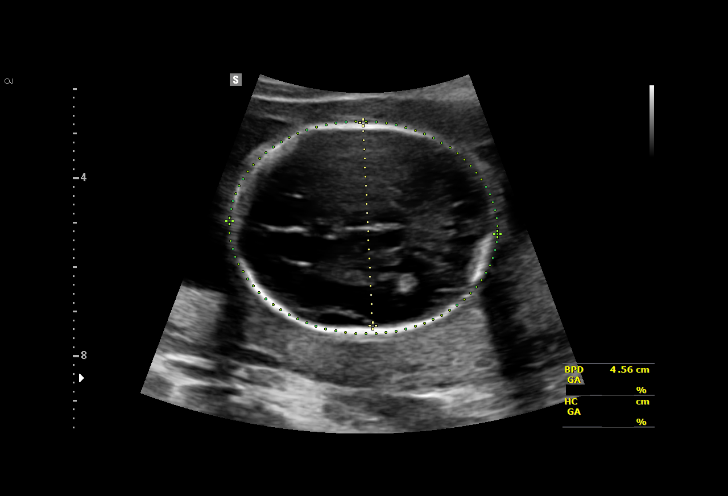
[im 31/75]
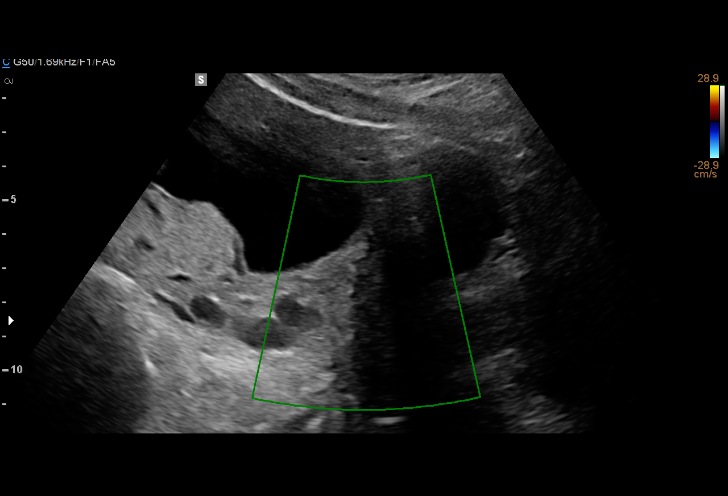
[im 36/75]
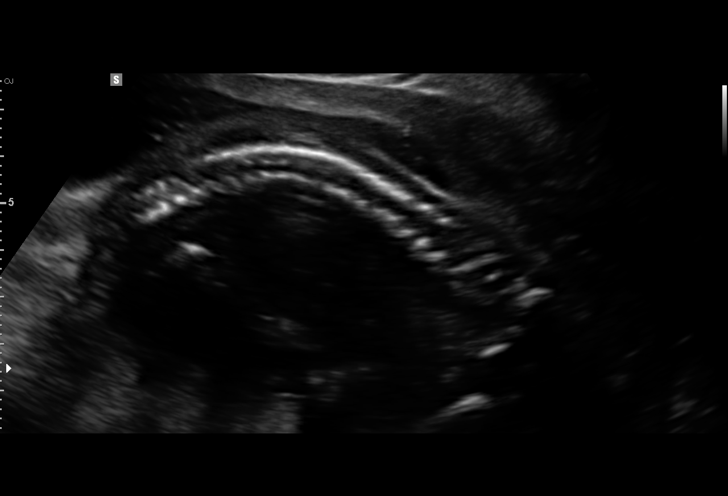
[im 42/75]
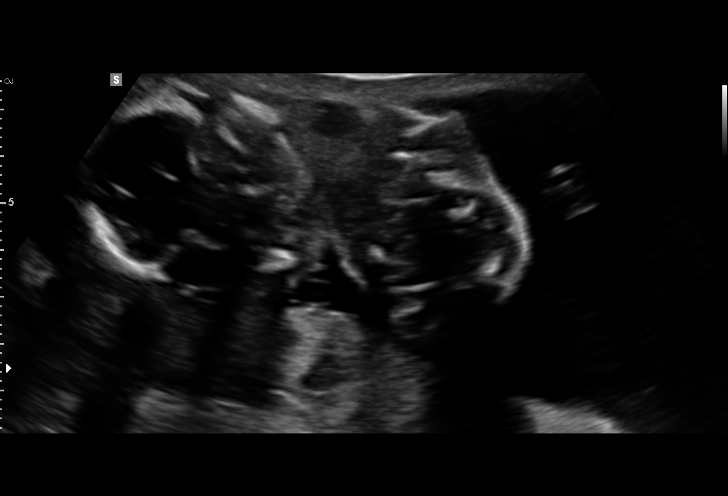
[im 47/75]
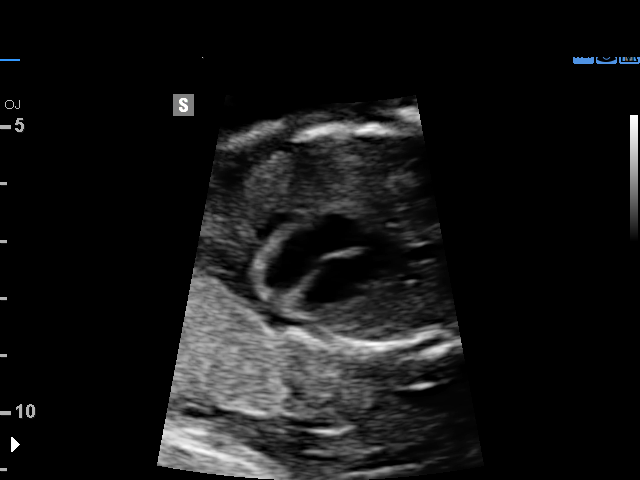
[im 53/75]
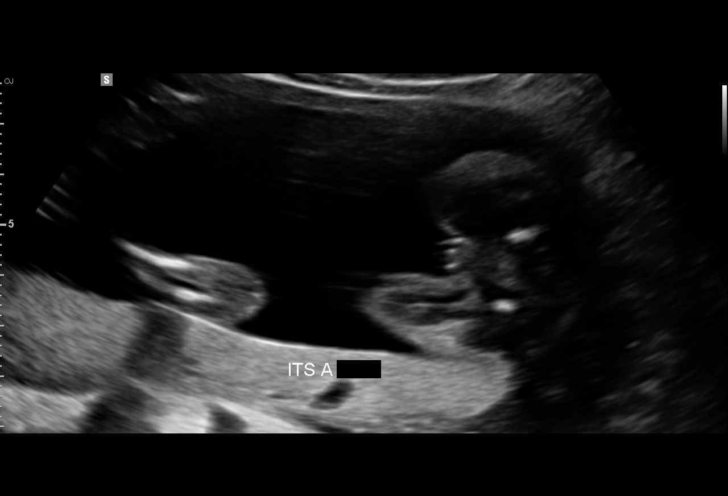
[im 58/75]
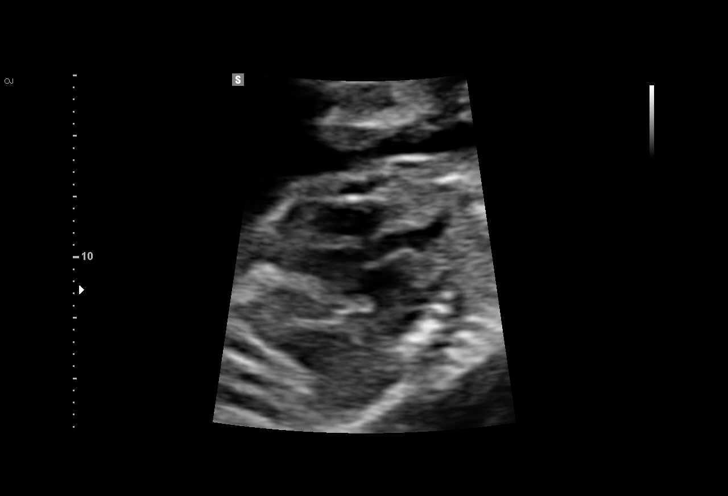
[im 64/75]
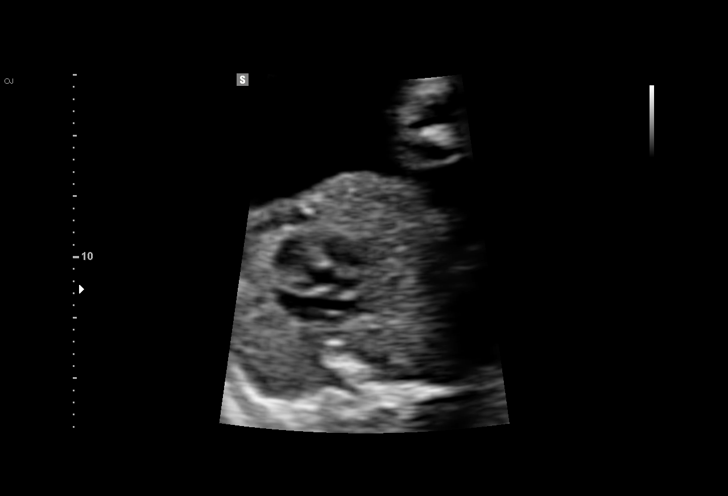
[im 69/75]
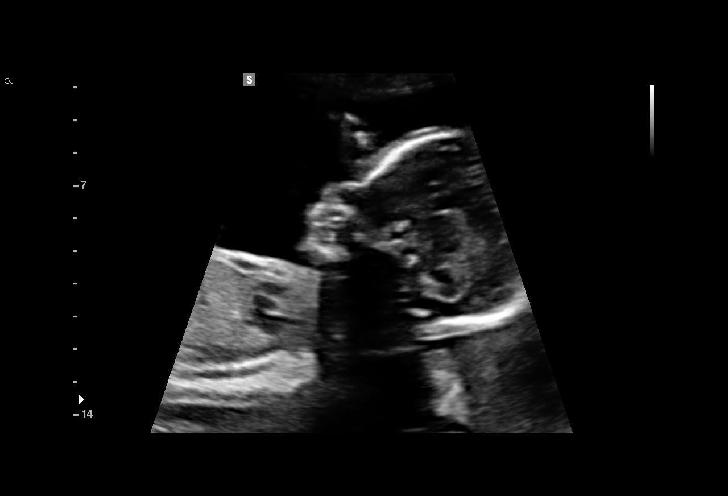
[im 75/75]
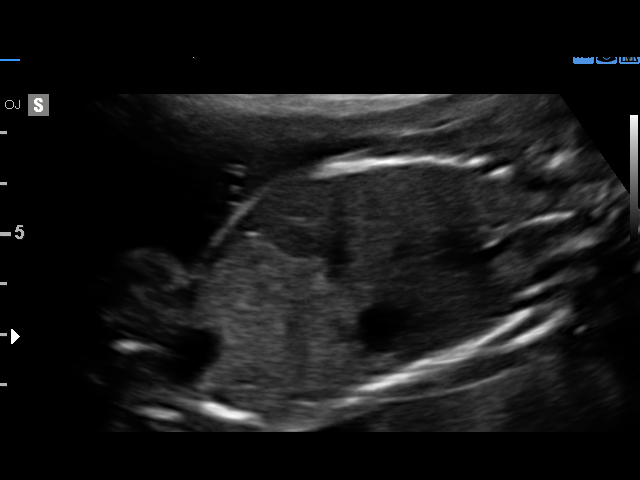

[14 of 28 positions shown; findings below may reference images not displayed]

[facility]

[facility] Clinic
[REDACTED]-
Faculty Physician

1  LORENZ JUMPER              [id number]      [id number]     [id number]
Indications

20 weeks gestation of pregnancy
Diabetes - Pregestational, 2nd trimester-
metformin
Hypothyroid- levothyroxine
Detailed fetal anatomic survey                 Z36
Hypertension - Chronic/Pre-existing- asa
OB History

Blood Type:            Height:         Weight (lb):  153.8    BMI:
Gravidity:    4         Term:   1         SAB:   2
Living:       1
Fetal Evaluation

Num Of Fetuses:     1
Fetal Heart         165
Rate(bpm):
Cardiac Activity:   Observed
Presentation:       Cephalic
Placenta:           Posterior, above cervical os
P. Cord Insertion:  Visualized

Amniotic Fluid
AFI FV:      Subjectively within normal limits

Largest Pocket(cm)
5.4
Biometry

BPD:      46.2  mm     G. Age:  20w 0d         42  %    CI:        70.44   %   70 - 86
FL/HC:      19.1   %   16.8 -
HC:      175.5  mm     G. Age:  20w 0d         39  %    HC/AC:      1.06       1.09 -
AC:      164.9  mm     G. Age:  21w 4d         84  %    FL/BPD:     72.5   %
FL:       33.5  mm     G. Age:  20w 4d         55  %    FL/AC:      20.3   %   20 - 24
HUM:      34.5  mm     G. Age:  21w 6d       > 95  %
CER:      21.3  mm     G. Age:  20w 2d         52  %

CM:        5.2  mm

Est. FW:     387  gm    0 lb 14 oz      59  %
Gestational Age

U/S Today:     20w 4d                                        EDD:   05/16/16
Best:          20w 1d    Det. By:   Early Ultrasound         EDD:   05/19/16
Anatomy

Cranium:               Appears normal         Aortic Arch:            Appears normal
Cavum:                 Appears normal         Ductal Arch:            Not well visualized
Ventricles:            Appears normal         Diaphragm:              Appears normal
Choroid Plexus:        Appears normal         Stomach:                Appears normal, left
sided
Cerebellum:            Appears normal         Abdomen:                Appears normal
Posterior Fossa:       Appears normal         Abdominal Wall:         Appears nml (cord
insert, abd wall)
Nuchal Fold:           Not applicable (>20    Cord Vessels:           Appears normal (3
wks GA)                                        vessel cord)
Face:                  Orbits nl; profile not Kidneys:                Appear normal
well visualized
Lips:                  Appears normal         Bladder:                Appears normal
Thoracic:              Appears normal         Spine:                  Appears normal
Heart:                 Not well visualized    Upper Extremities:      Appears normal
RVOT:                  Appears normal         Lower Extremities:      Appears normal
LVOT:                  Appears normal

Other:  Fetus appears to be a female. Heels visualized. NB visualized,
Technically difficult due to fetal position.
Cervix Uterus Adnexa

Cervix
Length:            3.4  cm.
Normal appearance by transabdominal scan.

Uterus
No abnormality visualized.

Left Ovary
Not visualized. No adnexal mass visualized.

Right Ovary
Not visualized. No adnexal mass visualized.
Impression

Single IUP at 20w 1d
Type 2 diabetes on metformin, chronic hypertension
Limited views of the profile and heart obtained
The remainder of the fetal anatomy appears normal
Ultrasound measurements are consistent with early
ultrasound
Normal amniotic fluid volume
Recommendations

Fetal echo scheduled
Recommend ultrasound for interval growth and to complete
the anatomic survey in 4 weeks

## 2016-01-02 ENCOUNTER — Other Ambulatory Visit (HOSPITAL_COMMUNITY): Payer: Self-pay | Admitting: *Deleted

## 2016-01-02 DIAGNOSIS — O24319 Unspecified pre-existing diabetes mellitus in pregnancy, unspecified trimester: Secondary | ICD-10-CM

## 2016-01-04 LAB — ALPHA FETOPROTEIN, MATERNAL
AFP: 43.3 ng/mL
Curr Gest Age: 20 weeks
MOM FOR AFP: 0.99
OPEN SPINA BIFIDA: NEGATIVE

## 2016-01-28 ENCOUNTER — Ambulatory Visit (INDEPENDENT_AMBULATORY_CARE_PROVIDER_SITE_OTHER): Payer: Medicaid Other | Admitting: Advanced Practice Midwife

## 2016-01-28 VITALS — BP 119/83 | HR 91 | Wt 157.0 lb

## 2016-01-28 DIAGNOSIS — O0992 Supervision of high risk pregnancy, unspecified, second trimester: Secondary | ICD-10-CM

## 2016-01-28 DIAGNOSIS — O24912 Unspecified diabetes mellitus in pregnancy, second trimester: Secondary | ICD-10-CM | POA: Diagnosis not present

## 2016-01-28 DIAGNOSIS — O99280 Endocrine, nutritional and metabolic diseases complicating pregnancy, unspecified trimester: Secondary | ICD-10-CM

## 2016-01-28 DIAGNOSIS — Z8759 Personal history of other complications of pregnancy, childbirth and the puerperium: Secondary | ICD-10-CM | POA: Diagnosis not present

## 2016-01-28 DIAGNOSIS — E039 Hypothyroidism, unspecified: Secondary | ICD-10-CM | POA: Diagnosis not present

## 2016-01-28 DIAGNOSIS — O10912 Unspecified pre-existing hypertension complicating pregnancy, second trimester: Secondary | ICD-10-CM | POA: Diagnosis present

## 2016-01-28 LAB — POCT URINALYSIS DIP (DEVICE)
Bilirubin Urine: NEGATIVE
GLUCOSE, UA: NEGATIVE mg/dL
Hgb urine dipstick: NEGATIVE
KETONES UR: NEGATIVE mg/dL
NITRITE: NEGATIVE
PROTEIN: NEGATIVE mg/dL
Specific Gravity, Urine: <=1.005 (ref 1.005–1.030)
Urobilinogen, UA: 0.2 mg/dL (ref 0.0–1.0)
pH: 5.5 (ref 5.0–8.0)

## 2016-01-28 NOTE — Patient Instructions (Signed)
Informacin sobre el parto prematuro  (Preterm Labor Information) El parto prematuro comienza antes de la semana 37 de embarazo. La duracin de un embarazo normal es de 39 a 41 semanas.  CAUSAS  Generalmente no hay una causa que pueda identificarse del motivo por el que una mujer comienza un trabajo de parto prematuro. Sin embargo, una de las causas conocidas ms frecuentes son las infecciones. Las infecciones del tero, el cuello, la vagina, el lquido amnitico, la vejiga, los riones y hasta de los pulmones (neumona) pueden hacer que el trabajo de parto se inicie. Otras causas que pueden sospecharse son:   Infecciones urogenitales, como infecciones por hongos y vaginosis bacteriana.   Anormalidades uterinas (forma del tero, sptum uterino, fibromas, hemorragias en la placenta).   Un cuello que ha sido operado (puede ser que no permanezca cerrado).   Malformaciones del feto.   Gestaciones mltiples (mellizos, trillizos y ms).   Ruptura del saco amnitico.  FACTORES DE RIESGO   Historia previa de parto prematuro.   Tener ruptura prematura de las membranas (RPM).   La placenta cubre la abertura del cuello (placenta previa).   La placenta se separa del tero (abrupcin placentaria).   El cuello es demasiado dbil para contener al beb en el tero (cuello incompetente).   Hay mucho lquido en el saco amnitico (polihidramnios).   Consumo de drogas o hbito de fumar durante el embarazo.   No aumentar de peso lo suficiente durante el embarazo.   Mujeres menores de 18 aos o mayores de 35 aos.   Nivel socioeconmico bajo.   Pertenecer a la raza afroamericana. SNTOMAS  Los signos y sntomas del trabajo de parto prematuro son:   Clicos similares a los menstruales, dolor abdominal o dolor de espalda.  Contracciones uterinas regulares, tan frecuentes como seis por hora, sin importar su intensidad (pueden ser suaves o dolorosas).  Contracciones que comienzan  en la parte superior del tero y se expanden hacia abajo, a la zona inferior del abdomen y la espalda.   Sensacin de aumento de presin en la pelvis.   Aparece una secrecin acuosa o sanguinolenta por la vagina.  TRATAMIENTO  Segn el tiempo del embarazo y otras circunstancias, el mdico puede indicar reposo en cama. Si es necesario, le indicarn medicamentos para detener las contracciones y para madurar los pulmones del feto. Si el trabajo de parto se inicia antes de las 34 semanas de embarazo, se recomienda la hospitalizacin. El tratamiento depende de las condiciones en que se encuentren usted y el feto.  QU DEBE HACER SI PIENSA QUE EST EN TRABAJO DE PARTO PREMATURO?  Comunquese con su mdico inmediatamente. Debe concurrir al hospital para ser controlada inmediatamente.  CMO PUEDE EVITAR EL TRABAJO DE PARTO PREMATURO EN FUTUROS EMBARAZOS?  Usted debe:   Si fuma, abandonar el hbito.  Mantener un peso saludable y evitar sustancias qumicas y drogas innecesarias.  Controlar todo tipo de infeccin.  Informe a su mdico si tiene una historia conocida de trabajo de parto prematuro.   Esta informacin no tiene como fin reemplazar el consejo del mdico. Asegrese de hacerle al mdico cualquier pregunta que tenga.   Document Released: 09/30/2007 Document Revised: 02/23/2013 Elsevier Interactive Patient Education 2016 Elsevier Inc.  

## 2016-01-28 NOTE — Progress Notes (Signed)
Subjective:  Regina Jones is a 25 y.o. [phone] at [redacted]w[redacted]d being seen today for ongoing prenatal care.  Regina Jones is currently monitored for the following issues for this high-risk pregnancy and has Diabetes mellitus without complication (HCC); Hypothyroidism; Hypothyroidism affecting pregnancy; Diabetes mellitus affecting pregnancy (HCC); History of gestational hypertension; Supervision of high-risk pregnancy; and Chronic hypertension in pregnancy on her problem list.  Patient reports no complaints.  Contractions: Not present. Vag. Bleeding: None.  Movement: Present. Denies leaking of fluid.   The following portions of the patient's history were reviewed and updated as appropriate: allergies, current medications, past family history, past medical history, past social history, past surgical history and problem list. Problem list updated.  Objective:   Vitals:   01/28/16 1637  BP: 119/83  Pulse: 91  Weight: 157 lb (71.2 kg)    Fetal Status: Fetal Heart Rate (bpm): 149 Fundal Height: 24 cm Movement: Present     General:  Alert, oriented and cooperative. Patient is in no acute distress.  Skin: Skin is warm and dry. No rash noted.   Cardiovascular: Normal heart rate noted  Respiratory: Normal respiratory effort, no problems with respiration noted  Abdomen: Soft, gravid, appropriate for gestational age. Pain/Pressure: Absent     Pelvic:  Cervical exam deferred        Extremities: Normal range of motion.  Edema: None  Mental Status: Normal mood and affect. Normal behavior. Normal judgment and thought content.   Urinalysis: Urine Protein: Negative Urine Glucose: Negative  Almost al CBGs in range.   Assessment and Plan:  Pregnancy: G4P1021 at [redacted]w[redacted]d  1. Chronic hypertension in pregnancy, second trimester - Growth [place] tomorrow  2. Hypothyroidism affecting pregnancy   3. Diabetes mellitus affecting pregnancy, second trimester (HCC)   4. History of gestational hypertension   5.  Supervision of high-risk pregnancy, second trimester   Preterm labor symptoms and general obstetric precautions including but not limited to vaginal bleeding, contractions, leaking of fluid and fetal movement were reviewed in detail with the patient. Please refer to After Visit Summary for other counseling recommendations.  F/U 4 weeks   Dorathy Kinsman, [place]

## 2016-01-29 ENCOUNTER — Ambulatory Visit (HOSPITAL_COMMUNITY)
Admission: RE | Admit: 2016-01-29 | Discharge: 2016-01-29 | Disposition: A | Discharge status: Home / Self Care | Payer: Medicaid Other | Source: Ambulatory Visit | Attending: Family Medicine | Admitting: Family Medicine

## 2016-01-29 ENCOUNTER — Encounter (HOSPITAL_COMMUNITY): Payer: Self-pay

## 2016-01-29 VITALS — BP 120/77 | HR 85 | Wt 158.4 lb

## 2016-01-29 DIAGNOSIS — O24312 Unspecified pre-existing diabetes mellitus in pregnancy, second trimester: Secondary | ICD-10-CM | POA: Diagnosis not present

## 2016-01-29 DIAGNOSIS — O10012 Pre-existing essential hypertension complicating pregnancy, second trimester: Secondary | ICD-10-CM | POA: Diagnosis not present

## 2016-01-29 DIAGNOSIS — E039 Hypothyroidism, unspecified: Secondary | ICD-10-CM | POA: Diagnosis not present

## 2016-01-29 DIAGNOSIS — O24919 Unspecified diabetes mellitus in pregnancy, unspecified trimester: Secondary | ICD-10-CM

## 2016-01-29 DIAGNOSIS — Z3A24 24 weeks gestation of pregnancy: Secondary | ICD-10-CM | POA: Insufficient documentation

## 2016-01-29 DIAGNOSIS — O99282 Endocrine, nutritional and metabolic diseases complicating pregnancy, second trimester: Secondary | ICD-10-CM | POA: Insufficient documentation

## 2016-01-29 DIAGNOSIS — O24319 Unspecified pre-existing diabetes mellitus in pregnancy, unspecified trimester: Secondary | ICD-10-CM

## 2016-01-29 IMAGING — US US MFM OB FOLLOW-UP
1 series · 14 of 28 positions shown · non-contrast
Comparison: none

[Series 1: us mfm ob follow-up · 58 acquisitions, 14 frames shown]
[im 3/58]
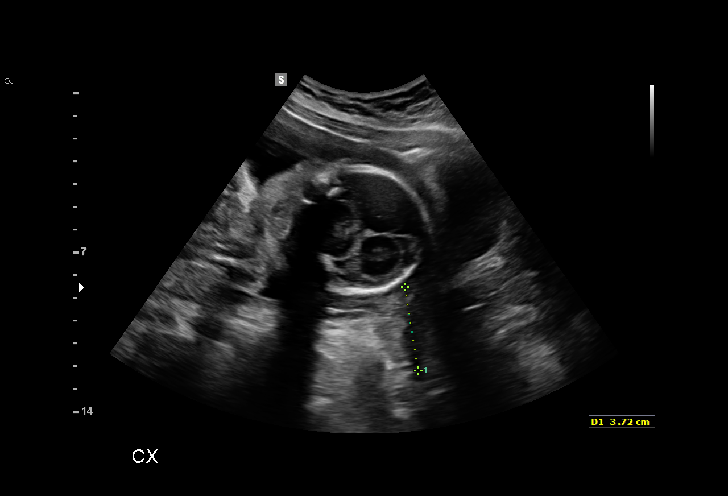
[im 7/58]
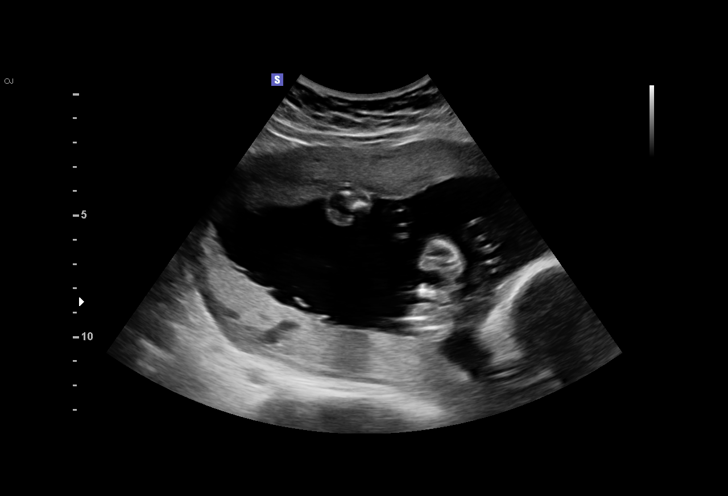
[im 11/58]
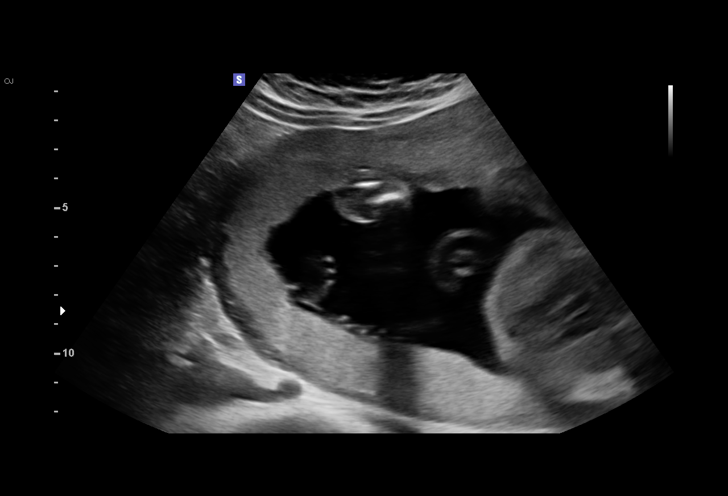
[im 15/58]
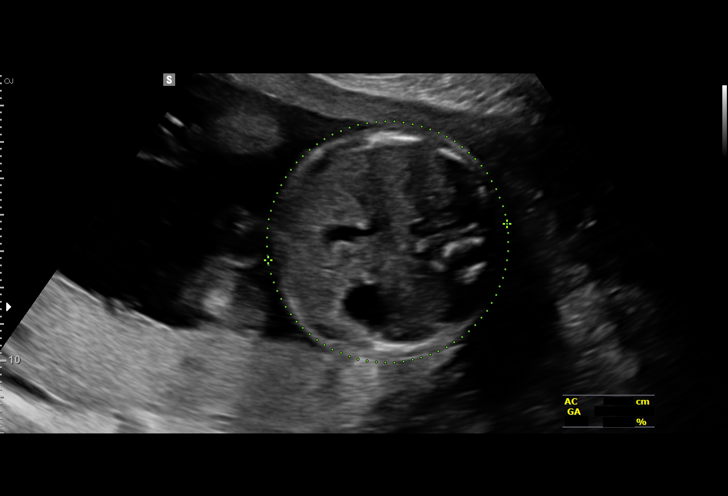
[im 20/58]
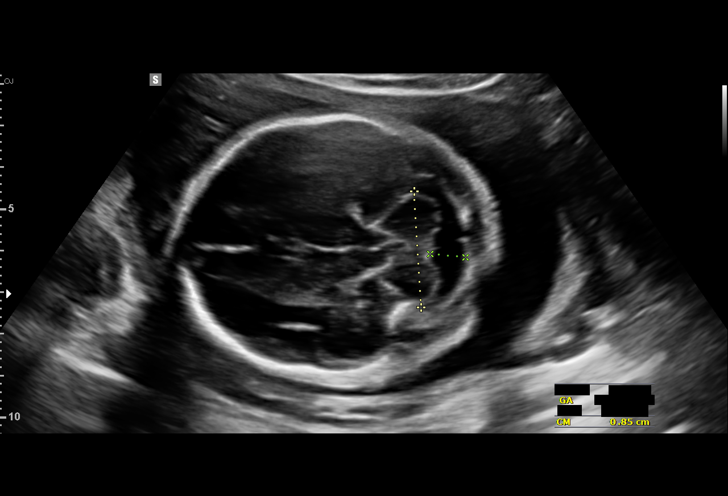
[im 24/58]
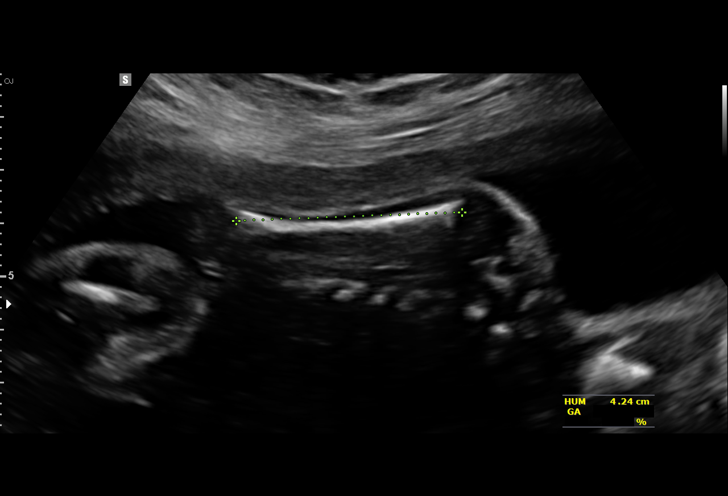
[im 28/58]
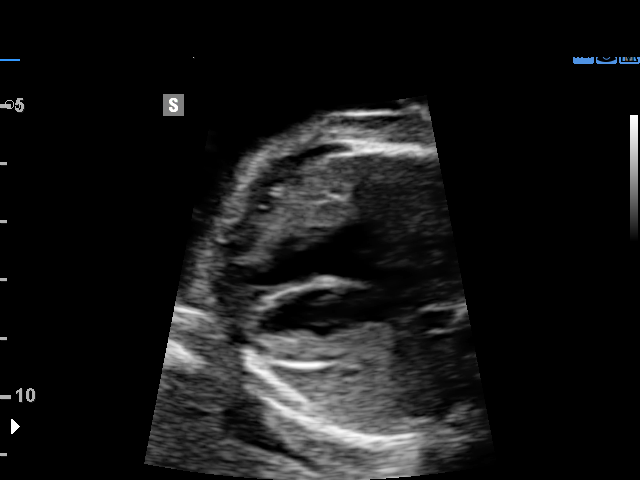
[im 32/58]
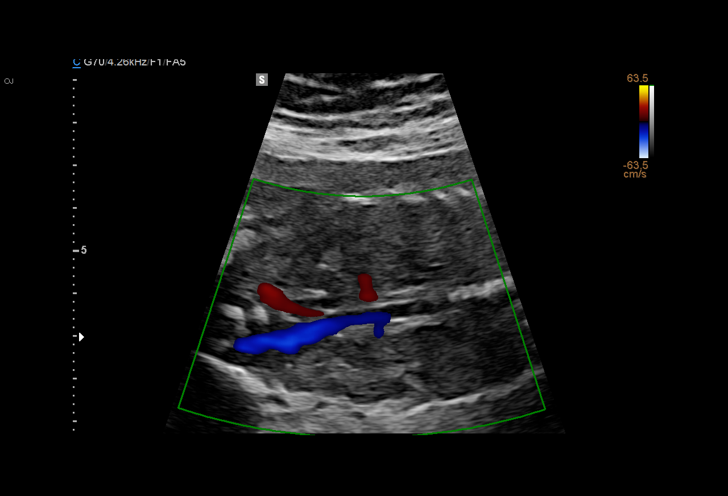
[im 36/58]
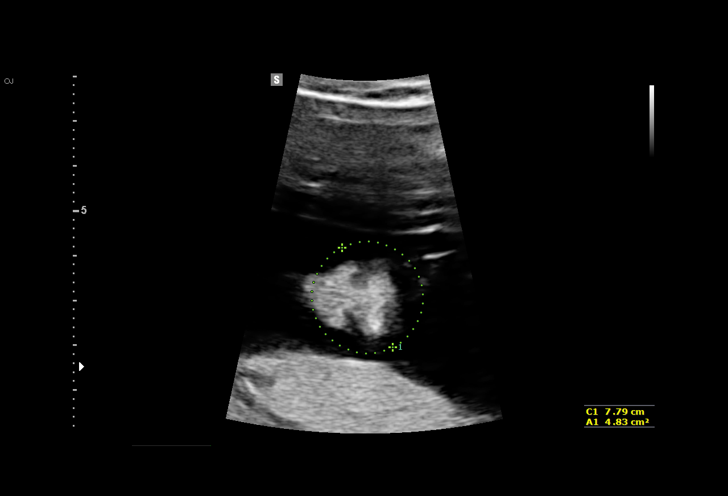
[im 41/58]
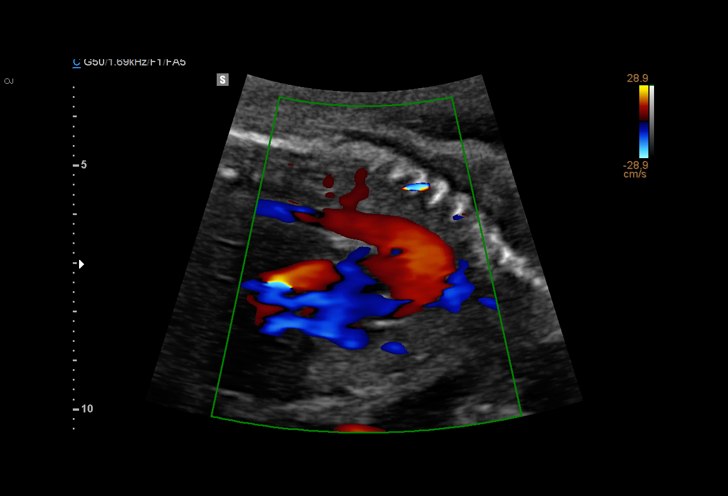
[im 45/58]
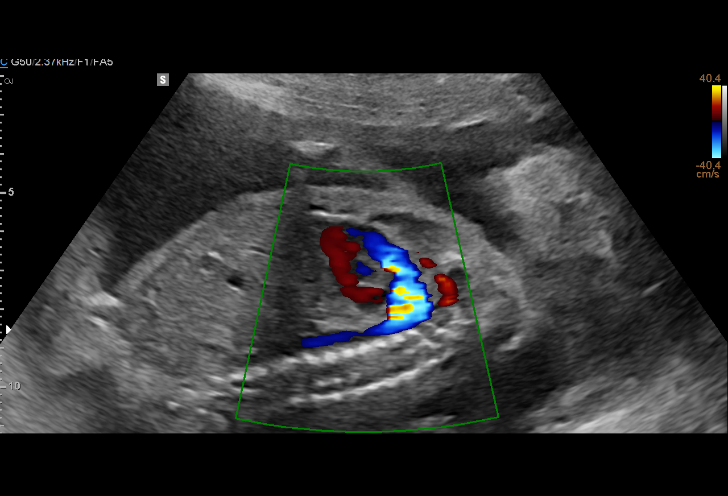
[im 49/58]
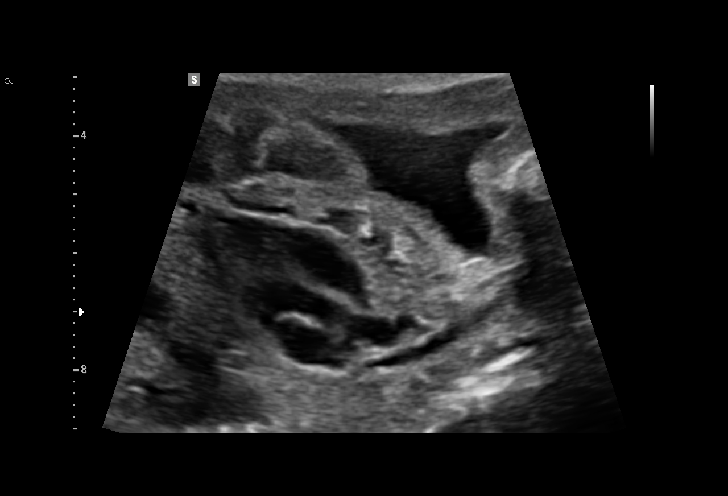
[im 53/58]
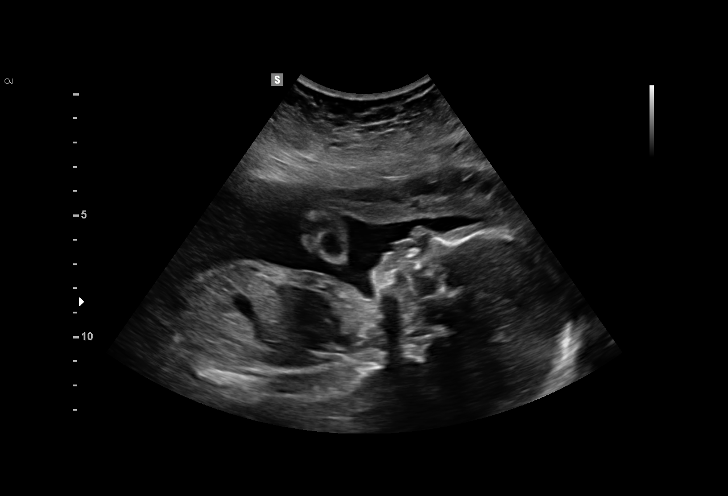
[im 58/58]
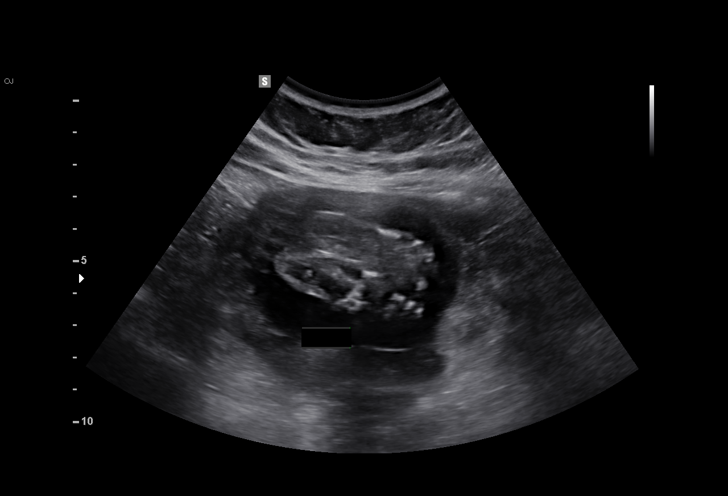

[14 of 28 positions shown; findings below may reference images not displayed]

[facility]

[facility]yn Clinic
[REDACTED]-
Faculty Physician

1  QUIRIJN AMAZIGH            [id number]      [id number]     [id number]
Indications

24 weeks gestation of pregnancy
Diabetes - Pregestational, 2nd trimester-
metformin
Hypothyroid- levothyroxine
Hypertension - Chronic/Pre-existing- asa
OB History

Blood Type:            Height:         Weight (lb):  153.8    BMI:
Gravidity:    4         Term:   1         SAB:   2
Living:       1
Fetal Evaluation

Num Of Fetuses:     1
Fetal Heart         136
Rate(bpm):
Cardiac Activity:   Observed
Presentation:       Cephalic
Placenta:           Posterior, above cervical os
P. Cord Insertion:  Visualized

Amniotic Fluid
AFI FV:      Subjectively within normal limits

Largest Pocket(cm)
5.5
Biometry

BPD:      61.7  mm     G. Age:  25w 1d         76  %    CI:        77.97   %   70 - 86
FL/HC:      20.3   %   18.7 -
HC:      221.1  mm     G. Age:  24w 1d         32  %    HC/AC:      1.07       1.05 -
AC:      206.5  mm     G. Age:  25w 2d         75  %    FL/BPD:     72.8   %   71 - 87
FL:       44.9  mm     G. Age:  24w 6d         59  %    FL/AC:      21.7   %   20 - 24
HUM:      42.4  mm     G. Age:  25w 3d         74  %
CER:      27.9  mm     G. Age:  25w 0d         69  %
CM:        8.5  mm

Est. FW:     755  gm    1 lb 11 oz      68  %
Gestational Age

U/S Today:     24w 6d                                        EDD:   05/14/16
Best:          24w 1d    Det. By:   Early Ultrasound         EDD:   05/19/16
Anatomy

Cranium:               Appears normal         Aortic Arch:            Appears normal
Cavum:                 Appears normal         Ductal Arch:            Appears normal
Ventricles:            Appears normal         Diaphragm:              Appears normal
Choroid Plexus:        Appears normal         Stomach:                Appears normal, left
sided
Cerebellum:            Appears normal         Abdomen:                Appears normal
Posterior Fossa:       Appears normal         Abdominal Wall:         Previously seen
Nuchal Fold:           Not applicable (>20    Cord Vessels:           Appears normal (3
wks GA)                                        vessel cord)
Face:                  Profile appears        Kidneys:                Appear normal
normal
Lips:                  Appears normal         Bladder:                Appears normal
Thoracic:              Appears normal         Spine:                  Previously seen
Heart:                 Appears normal         Upper Extremities:      Previously seen
(4CH, axis, and situs
RVOT:                  Appears normal         Lower Extremities:      Previously seen
LVOT:                  Appears normal

Other:  Fetus appears to be a female. Heels and  NB  prev. visualized,
Technically difficult due to fetal position.
Cervix Uterus Adnexa

Cervix
Length:            3.7  cm.
Normal appearance by transabdominal scan.

Uterus
No abnormality visualized.

Left Ovary
Within normal limits.

Right Ovary
Within normal limits.
Impression

Single IUP at 24w 1d
Pregestational diabetes on metformin, Chronic hypertension -
normal fetal echo
Normal interval anatomy
Fetal growth is appropriate (68th %tile)
Posterior placenta without previa
Normal amniotic fluid volume
Recommendations

Recommend follow-up ultrasound examination in 4 weeks for
interval growth

## 2016-02-13 ENCOUNTER — Other Ambulatory Visit: Payer: Self-pay

## 2016-02-13 DIAGNOSIS — E039 Hypothyroidism, unspecified: Secondary | ICD-10-CM

## 2016-02-13 MED ORDER — LEVOTHYROXINE SODIUM 50 MCG PO TABS: 50.0000 ug | ORAL_TABLET | Freq: Every day | ORAL | 2 refills | Status: DC

## 2016-02-25 ENCOUNTER — Other Ambulatory Visit (HOSPITAL_COMMUNITY): Payer: Self-pay | Admitting: Obstetrics & Gynecology

## 2016-02-25 ENCOUNTER — Ambulatory Visit (INDEPENDENT_AMBULATORY_CARE_PROVIDER_SITE_OTHER): Payer: Medicaid Other | Admitting: Obstetrics & Gynecology

## 2016-02-25 VITALS — BP 132/88 | HR 82 | Wt 162.7 lb

## 2016-02-25 DIAGNOSIS — Z23 Encounter for immunization: Secondary | ICD-10-CM | POA: Diagnosis not present

## 2016-02-25 DIAGNOSIS — O0993 Supervision of high risk pregnancy, unspecified, third trimester: Secondary | ICD-10-CM

## 2016-02-25 DIAGNOSIS — O99280 Endocrine, nutritional and metabolic diseases complicating pregnancy, unspecified trimester: Secondary | ICD-10-CM | POA: Diagnosis not present

## 2016-02-25 DIAGNOSIS — Z3493 Encounter for supervision of normal pregnancy, unspecified, third trimester: Secondary | ICD-10-CM

## 2016-02-25 DIAGNOSIS — E039 Hypothyroidism, unspecified: Secondary | ICD-10-CM | POA: Diagnosis not present

## 2016-02-25 DIAGNOSIS — Z3483 Encounter for supervision of other normal pregnancy, third trimester: Secondary | ICD-10-CM

## 2016-02-25 LAB — POCT URINALYSIS DIP (DEVICE)
Bilirubin Urine: NEGATIVE
GLUCOSE, UA: NEGATIVE mg/dL
HGB URINE DIPSTICK: NEGATIVE
KETONES UR: NEGATIVE mg/dL
Leukocytes, UA: NEGATIVE
Nitrite: NEGATIVE
PH: 6 (ref 5.0–8.0)
PROTEIN: NEGATIVE mg/dL
SPECIFIC GRAVITY, URINE: 1.025 (ref 1.005–1.030)
Urobilinogen, UA: 0.2 mg/dL (ref 0.0–1.0)

## 2016-02-25 LAB — CBC
HEMATOCRIT: 37.1 % (ref 35.0–45.0)
Hemoglobin: 12.3 g/dL (ref 11.7–15.5)
MCH: 32.7 pg (ref 27.0–33.0)
MCHC: 33.2 g/dL (ref 32.0–36.0)
MCV: 98.7 fL (ref 80.0–100.0)
MPV: 10 fL (ref 7.5–12.5)
Platelets: 238 10*3/uL (ref 140–400)
RBC: 3.76 MIL/uL — ABNORMAL LOW (ref 3.80–5.10)
RDW: 14.6 % (ref 11.0–15.0)
WBC: 6.8 10*3/uL (ref 3.8–10.8)

## 2016-02-25 LAB — TSH: TSH: 2.07 mIU/L

## 2016-02-25 LAB — HIV ANTIBODY (ROUTINE TESTING W REFLEX): HIV 1&2 Ab, 4th Generation: NONREACTIVE

## 2016-02-25 NOTE — Progress Notes (Signed)
Subjective:  Regina Jones is a 25 y.o. [phone] at 3551w0d being seen today for ongoing prenatal care.  She is currently monitored for the following issues for this high-risk pregnancy and has Diabetes mellitus without complication (HCC); Hypothyroidism; Hypothyroidism affecting pregnancy; Diabetes mellitus affecting pregnancy (HCC); History of gestational hypertension; Supervision of high-risk pregnancy; and Chronic hypertension in pregnancy on her problem list.  Patient reports no complaints.  Contractions: Not present. Vag. Bleeding: None.  Movement: Present. Denies leaking of fluid.   The following portions of the patient's history were reviewed and updated as appropriate: allergies, current medications, past family history, past medical history, past social history, past surgical history and problem list. Problem list updated.  Objective:   Vitals:   02/25/16 0917  BP: 132/88  Pulse: 82  Weight: 162 lb 11.2 oz (73.8 kg)    Fetal Status: Fetal Heart Rate (bpm): 135   Movement: Present    She forgot her recorded sugars but reports that the fbs are all less than 90 and her 2 hour PCs are all less than 120. General:  Alert, oriented and cooperative. Patient is in no acute distress.  Skin: Skin is warm and dry. No rash noted.   Cardiovascular: Normal heart rate noted  Respiratory: Normal respiratory effort, no problems with respiration noted  Abdomen: Soft, gravid, appropriate for gestational age. Pain/Pressure: Absent     Pelvic:  Cervical exam deferred        Extremities: Normal range of motion.  Edema: None  Mental Status: Normal mood and affect. Normal behavior. Normal judgment and thought content.   Urinalysis:      Assessment and Plan:  Pregnancy: G4P1021 at 451w0d  1. Supervision of normal pregnancy in third trimester  - TSH - CBC - RPR - HIV antibody (with reflex) - TDAP  2. Hypothyroidism affecting pregnancy - Check TSH  3. Supervision of high-risk  pregnancy, third trimester   Preterm labor symptoms and general obstetric precautions including but not limited to vaginal bleeding, contractions, leaking of fluid and fetal movement were reviewed in detail with the patient. Please refer to After Visit Summary for other counseling recommendations.  No Follow-up on file.   Allie BossierMyra C Kingdom Vanzanten, MD

## 2016-02-26 ENCOUNTER — Encounter (HOSPITAL_COMMUNITY): Payer: Self-pay

## 2016-02-26 ENCOUNTER — Ambulatory Visit (HOSPITAL_COMMUNITY)
Admission: RE | Admit: 2016-02-26 | Discharge: 2016-02-26 | Disposition: A | Discharge status: Home / Self Care | Payer: Medicaid Other | Source: Ambulatory Visit | Attending: Family Medicine | Admitting: Family Medicine

## 2016-02-26 DIAGNOSIS — Z3A29 29 weeks gestation of pregnancy: Secondary | ICD-10-CM | POA: Insufficient documentation

## 2016-02-26 DIAGNOSIS — E039 Hypothyroidism, unspecified: Secondary | ICD-10-CM | POA: Diagnosis not present

## 2016-02-26 DIAGNOSIS — O24313 Unspecified pre-existing diabetes mellitus in pregnancy, third trimester: Secondary | ICD-10-CM | POA: Insufficient documentation

## 2016-02-26 DIAGNOSIS — O99283 Endocrine, nutritional and metabolic diseases complicating pregnancy, third trimester: Secondary | ICD-10-CM | POA: Diagnosis not present

## 2016-02-26 DIAGNOSIS — O24919 Unspecified diabetes mellitus in pregnancy, unspecified trimester: Secondary | ICD-10-CM

## 2016-02-26 LAB — RPR

## 2016-02-26 LAB — HEMOGLOBIN A1C
Hgb A1c MFr Bld: 5.5 % (ref <5.7)
Mean Plasma Glucose: 111 mg/dL

## 2016-02-26 IMAGING — US US MFM OB FOLLOW-UP
1 series · 14 of 28 positions shown · non-contrast
Comparison: none

[Series 1: us mfm ob follow-up · 14 of 54 slices shown]
[im 2/54]
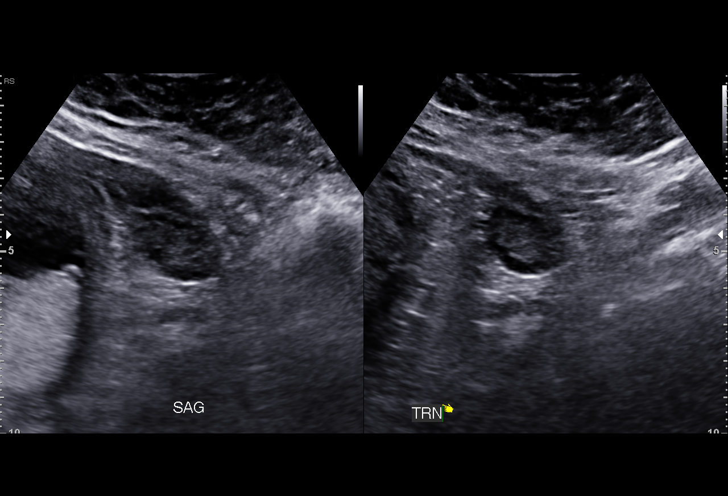
[im 6/54]
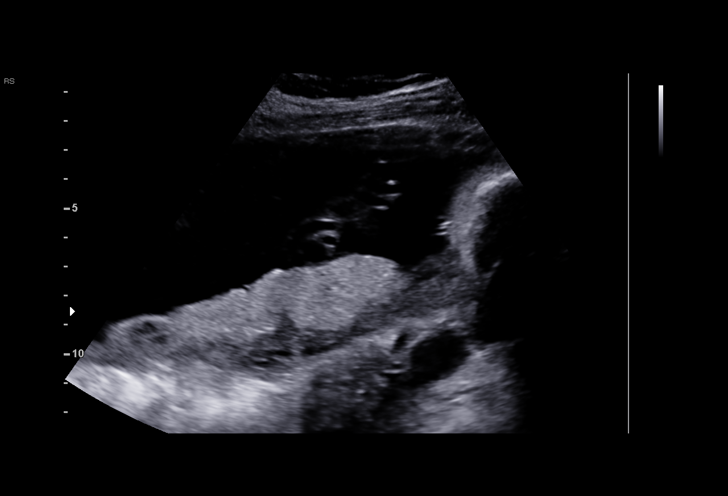
[im 10/54]
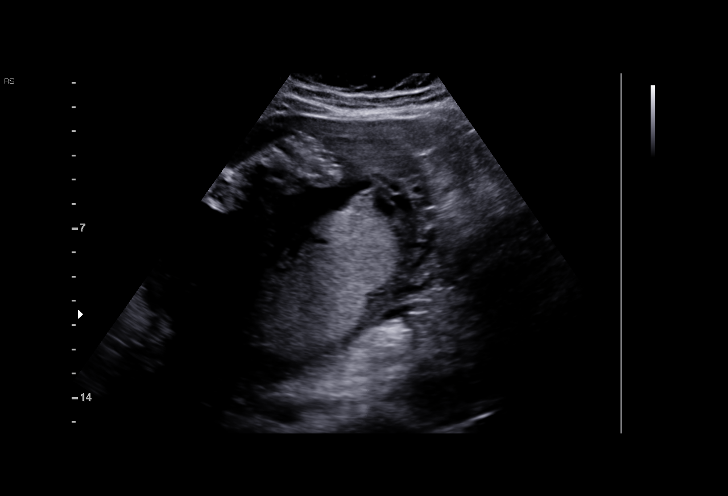
[im 14/54]
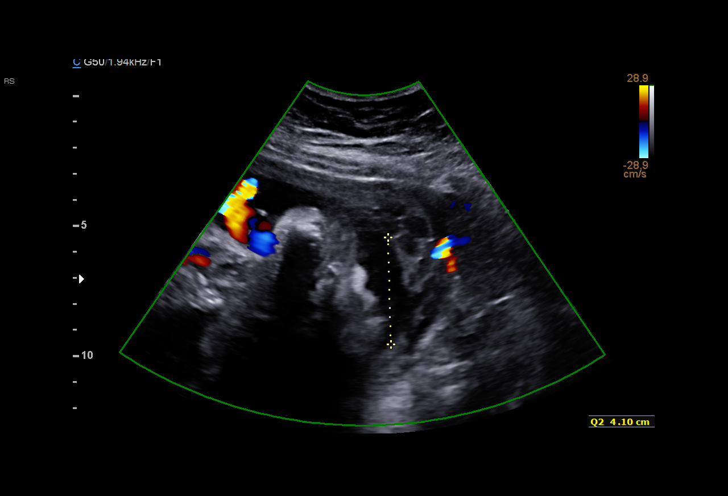
[im 18/54]
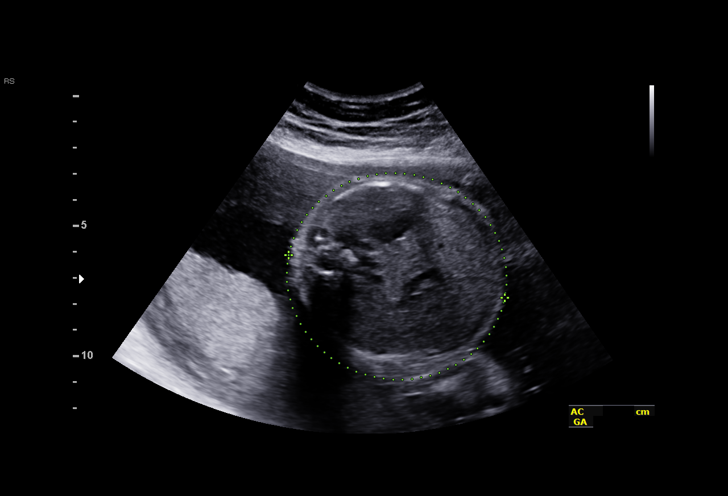
[im 22/54]
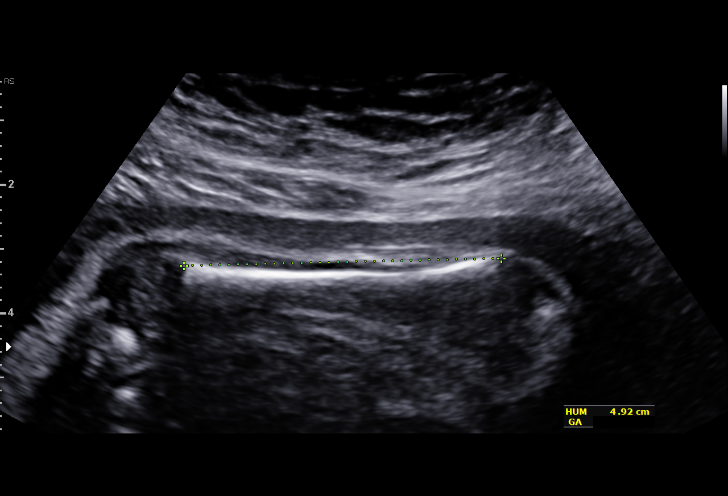
[im 26/54]
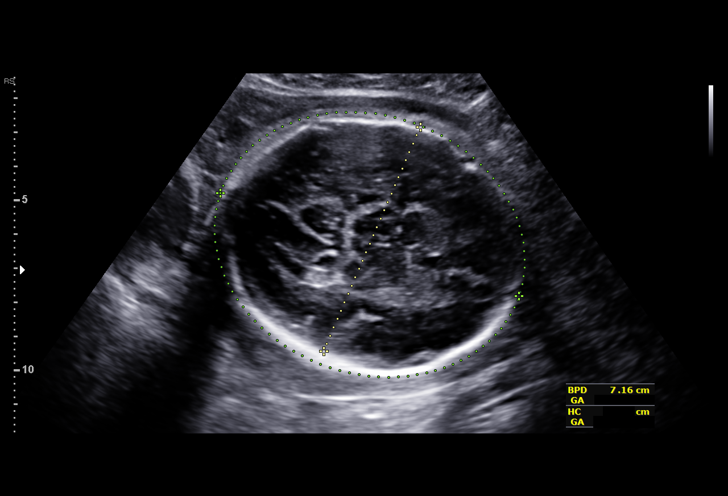
[im 30/54]
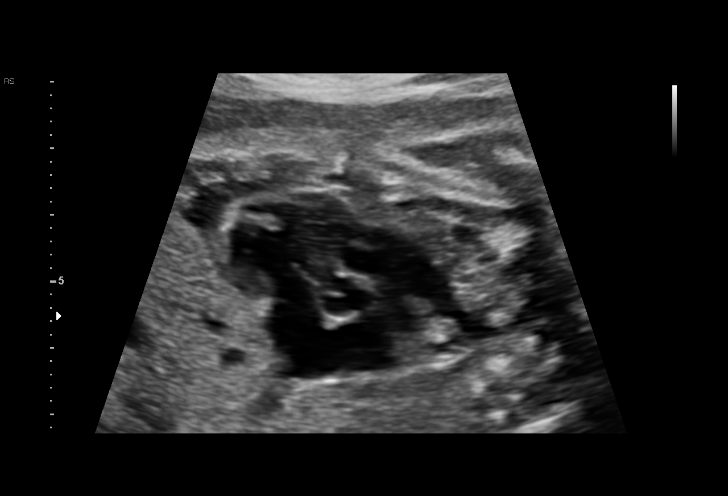
[im 34/54]
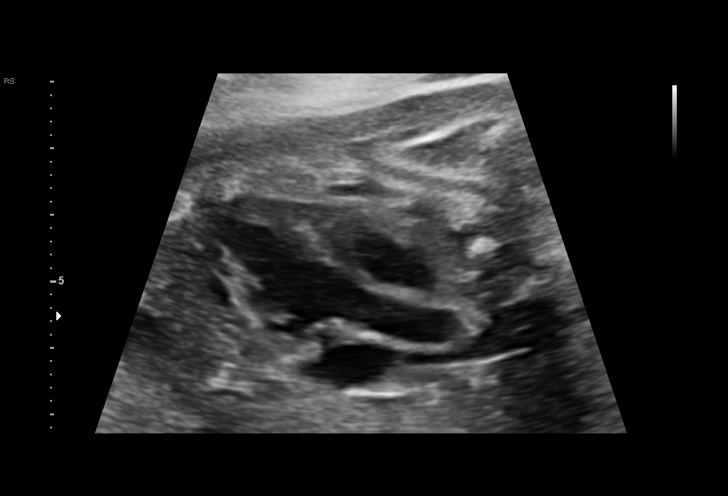
[im 38/54]
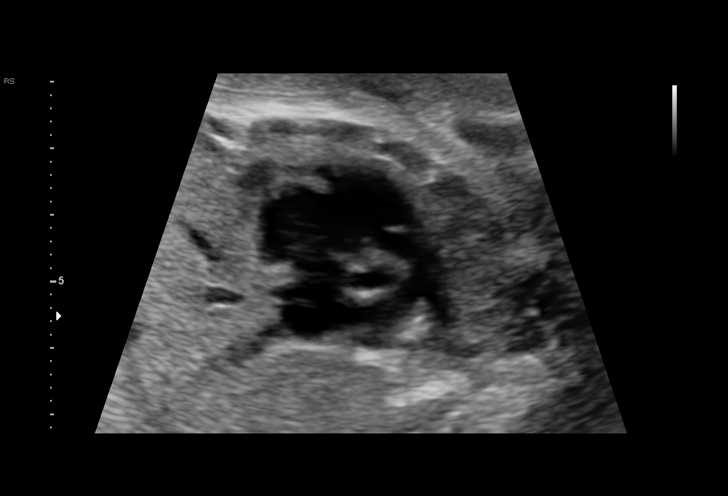
[im 42/54]
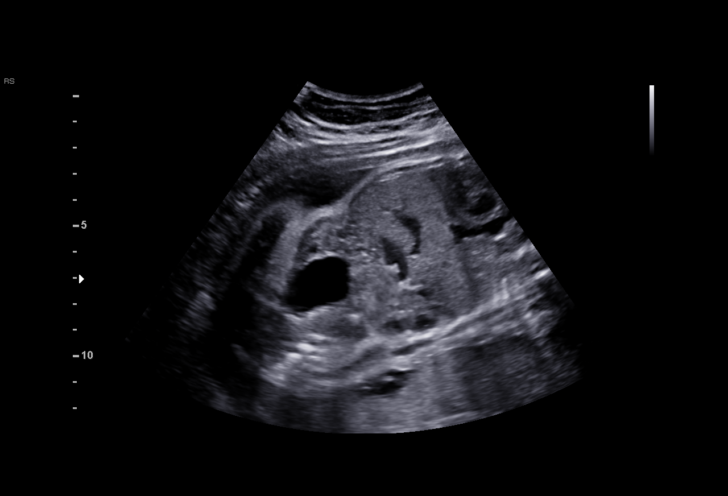
[im 46/54]
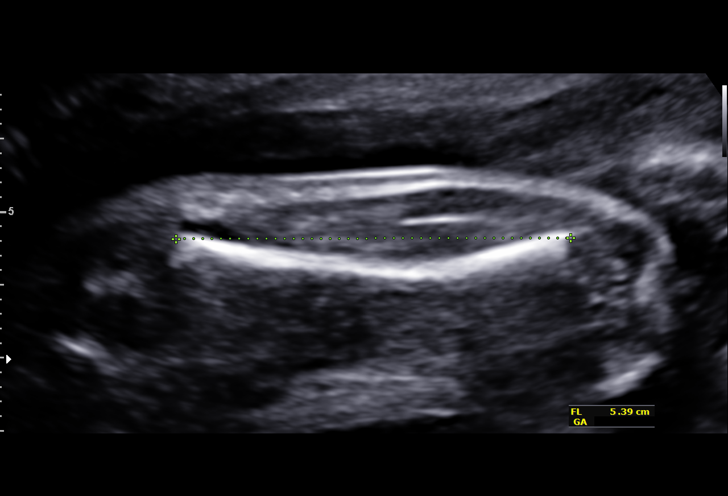
[im 50/54]
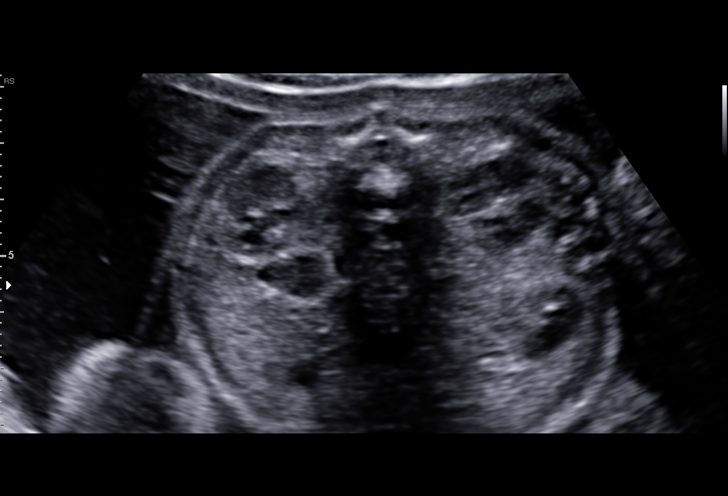
[im 54/54]
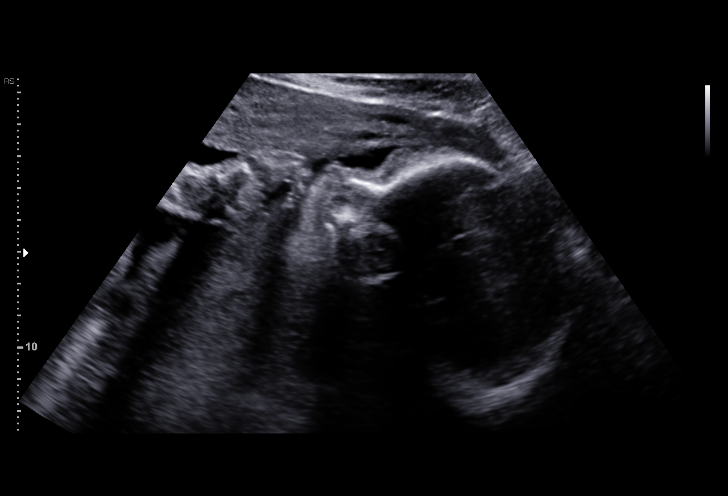

[14 of 28 positions shown; findings below may reference images not displayed]

[facility]

OB/Gyn Clinic

1  GENESIS FRANCK            [id number]      [id number]     [id number]
Indications

28 weeks gestation of pregnancy
Hypothyroid- levothyroxine
Hypertension - Chronic/Pre-existing- ASA
Diabetes - Pregestational, 3rd trimester (
Metformin )
OB History

Blood Type:            Height:         Weight (lb):  153.8    BMI:
Gravidity:    4         Term:   1         SAB:   2
Living:       1
Fetal Evaluation

Num Of Fetuses:     1
Fetal Heart         157
Rate(bpm):
Cardiac Activity:   Observed
Presentation:       Cephalic
Placenta:           Posterior, above cervical os
P. Cord Insertion:  Previously Visualized

Amniotic Fluid
AFI FV:      Subjectively within normal limits

AFI Sum(cm)     %Tile       Largest Pocket(cm)
17.97           68
RUQ(cm)       RLQ(cm)       LUQ(cm)        LLQ(cm)
4.63
Biometry

BPD:      72.7  mm     G. Age:  29w 1d         72  %    CI:        73.51   %   70 - 86
FL/HC:      19.8   %   18.8 -
HC:      269.4  mm     G. Age:  29w 2d         57  %    HC/AC:      1.05       1.05 -
AC:      257.7  mm     G. Age:  30w 0d         89  %    FL/BPD:     73.3   %   71 - 87
FL:       53.3  mm     G. Age:  28w 2d         40  %    FL/AC:      20.7   %   20 - 24
HUM:      48.6  mm     G. Age:  28w 4d         54  %

Est. FW:    9902  gm          3 lb      74  %
Gestational Age

U/S Today:     29w 1d                                        EDD:   05/12/16
Best:          28w 1d    Det. By:   Early Ultrasound         EDD:   05/19/16
Anatomy

Cranium:               Appears normal         Aortic Arch:            Previously seen
Cavum:                 Previously seen        Ductal Arch:            Previously seen
Ventricles:            Appears normal         Diaphragm:              Previously seen
Choroid Plexus:        Previously seen        Stomach:                Appears normal, left
sided
Cerebellum:            Previously seen        Abdomen:                Previously seen
Posterior Fossa:       Previously seen        Abdominal Wall:         Previously seen
Nuchal Fold:           Not applicable (>20    Cord Vessels:           Previously seen
wks GA)
Face:                  Profile previously     Kidneys:                Appear normal
seen
Lips:                  Appears normal         Bladder:                Appears normal
Thoracic:              Appears normal         Spine:                  Previously seen
Heart:                 Previously seen        Upper Extremities:      Previously seen
RVOT:                  Appears normal         Lower Extremities:      Previously seen
LVOT:                  Appears normal

Other:  Fetus appears to be a female. Heels and  NB  prev. visualized,
Technically difficult due to fetal position.
Cervix Uterus Adnexa

Cervix
Not visualized (advanced GA >27wks)

Uterus
No abnormality visualized.

Left Ovary
Within normal limits.

Right Ovary
Within normal limits.

Adnexa:       No abnormality visualized. No adnexal mass
visualized.
Impression

SIUP at 28+1 weeks
Normal interval anatomy; anatomic survey complete
Normal amniotic fluid volume
Appropriate interval growth with EFW at the 74th %tile
Recommendations

Follow-up ultrasound for growth in 4 weeks

## 2016-02-27 ENCOUNTER — Other Ambulatory Visit (HOSPITAL_COMMUNITY): Payer: Self-pay | Admitting: *Deleted

## 2016-02-27 DIAGNOSIS — O24919 Unspecified diabetes mellitus in pregnancy, unspecified trimester: Secondary | ICD-10-CM

## 2016-02-27 DIAGNOSIS — Z7984 Long term (current) use of oral hypoglycemic drugs: Principal | ICD-10-CM

## 2016-03-17 ENCOUNTER — Encounter: Payer: Self-pay | Admitting: Family Medicine

## 2016-03-17 ENCOUNTER — Ambulatory Visit (INDEPENDENT_AMBULATORY_CARE_PROVIDER_SITE_OTHER): Payer: Medicaid Other | Admitting: Obstetrics & Gynecology

## 2016-03-17 VITALS — BP 121/81 | HR 79 | Wt 165.0 lb

## 2016-03-17 DIAGNOSIS — Z23 Encounter for immunization: Secondary | ICD-10-CM | POA: Diagnosis not present

## 2016-03-17 DIAGNOSIS — O24313 Unspecified pre-existing diabetes mellitus in pregnancy, third trimester: Secondary | ICD-10-CM

## 2016-03-17 DIAGNOSIS — E039 Hypothyroidism, unspecified: Secondary | ICD-10-CM

## 2016-03-17 DIAGNOSIS — O99283 Endocrine, nutritional and metabolic diseases complicating pregnancy, third trimester: Secondary | ICD-10-CM

## 2016-03-17 DIAGNOSIS — E119 Type 2 diabetes mellitus without complications: Secondary | ICD-10-CM | POA: Diagnosis not present

## 2016-03-17 DIAGNOSIS — O0993 Supervision of high risk pregnancy, unspecified, third trimester: Secondary | ICD-10-CM

## 2016-03-17 LAB — POCT URINALYSIS DIP (DEVICE)
BILIRUBIN URINE: NEGATIVE
GLUCOSE, UA: NEGATIVE mg/dL
Hgb urine dipstick: NEGATIVE
KETONES UR: NEGATIVE mg/dL
Leukocytes, UA: NEGATIVE
Nitrite: NEGATIVE
Protein, ur: NEGATIVE mg/dL
SPECIFIC GRAVITY, URINE: 1.015 (ref 1.005–1.030)
Urobilinogen, UA: 0.2 mg/dL (ref 0.0–1.0)
pH: 6.5 (ref 5.0–8.0)

## 2016-03-17 LAB — GLUCOSE, CAPILLARY: GLUCOSE-CAPILLARY: 113 mg/dL — AB (ref 65–99)

## 2016-03-17 NOTE — Progress Notes (Signed)
   PRENATAL VISIT NOTE  Subjective:  Regina Jones is a 25 y.o. [phone] at 7973w0d being seen today for ongoing prenatal care.  She is currently monitored for the following issues for this high-risk pregnancy and has Diabetes mellitus without complication (HCC); Hypothyroidism; Hypothyroidism affecting pregnancy; Diabetes mellitus affecting pregnancy (HCC); History of gestational hypertension; Supervision of high-risk pregnancy; and Chronic hypertension in pregnancy on her problem list.  Patient reports no complaints.  Contractions: Irritability. Vag. Bleeding: None.  Movement: Present. Denies leaking of fluid.   The following portions of the patient's history were reviewed and updated as appropriate: allergies, current medications, past family history, past medical history, past social history, past surgical history and problem list. Problem list updated.  Objective:   Vitals:   03/17/16 0917  BP: 121/81  Pulse: 79  Weight: 165 lb (74.8 kg)    Fetal Status: Fetal Heart Rate (bpm): 140   Movement: Present     General:  Alert, oriented and cooperative. Patient is in no acute distress.  Skin: Skin is warm and dry. No rash noted.   Cardiovascular: Normal heart rate noted  Respiratory: Normal respiratory effort, no problems with respiration noted  Abdomen: Soft, gravid, appropriate for gestational age. Pain/Pressure: Present     Pelvic:  Cervical exam deferred        Extremities: Normal range of motion.  Edema: None  Mental Status: Normal mood and affect. Normal behavior. Normal judgment and thought content.   Urinalysis: Urine Protein: Negative Urine Glucose: Negative  Assessment and Plan:  Pregnancy: G4P1021 at 1073w0d  1. Flu vaccine need - Flu Vaccine QUAD 36+ mos IM (Fluarix & Fluzone Quad PF  2.  Type 2 DM - well controlled, continue metformin - 2x weekly testing at 32 weeks - continue synthroid    Preterm labor symptoms and general obstetric precautions including  but not limited to vaginal bleeding, contractions, leaking of fluid and fetal movement were reviewed in detail with the patient. Please refer to After Visit Summary for other counseling recommendations.  Return in about 2 weeks (around 03/31/2016).  Lesly DukesKelly H Amy Belloso, MD

## 2016-03-17 NOTE — Progress Notes (Signed)
Video Interpreter [id number]  Flu vaccine consented and given

## 2016-03-25 ENCOUNTER — Ambulatory Visit (HOSPITAL_COMMUNITY)
Admission: RE | Admit: 2016-03-25 | Discharge: 2016-03-25 | Disposition: A | Discharge status: Home / Self Care | Payer: Medicaid Other | Source: Ambulatory Visit | Attending: Family Medicine | Admitting: Family Medicine

## 2016-03-25 ENCOUNTER — Other Ambulatory Visit (HOSPITAL_COMMUNITY): Payer: Self-pay | Admitting: Maternal and Fetal Medicine

## 2016-03-25 ENCOUNTER — Encounter (HOSPITAL_COMMUNITY): Payer: Self-pay

## 2016-03-25 VITALS — BP 117/88 | HR 81 | Wt 164.8 lb

## 2016-03-25 DIAGNOSIS — O10913 Unspecified pre-existing hypertension complicating pregnancy, third trimester: Secondary | ICD-10-CM | POA: Diagnosis not present

## 2016-03-25 DIAGNOSIS — O24919 Unspecified diabetes mellitus in pregnancy, unspecified trimester: Secondary | ICD-10-CM

## 2016-03-25 DIAGNOSIS — Z3A32 32 weeks gestation of pregnancy: Secondary | ICD-10-CM

## 2016-03-25 DIAGNOSIS — E039 Hypothyroidism, unspecified: Secondary | ICD-10-CM

## 2016-03-25 DIAGNOSIS — O24913 Unspecified diabetes mellitus in pregnancy, third trimester: Secondary | ICD-10-CM | POA: Diagnosis not present

## 2016-03-25 DIAGNOSIS — O99280 Endocrine, nutritional and metabolic diseases complicating pregnancy, unspecified trimester: Secondary | ICD-10-CM

## 2016-03-25 DIAGNOSIS — O99283 Endocrine, nutritional and metabolic diseases complicating pregnancy, third trimester: Secondary | ICD-10-CM | POA: Diagnosis not present

## 2016-03-25 DIAGNOSIS — Z7984 Long term (current) use of oral hypoglycemic drugs: Principal | ICD-10-CM

## 2016-03-25 DIAGNOSIS — O10919 Unspecified pre-existing hypertension complicating pregnancy, unspecified trimester: Secondary | ICD-10-CM

## 2016-03-25 IMAGING — US US MFM OB FOLLOW-UP
1 series · 14 of 28 positions shown · non-contrast
Comparison: none

[Series 1: us mfm ob follow-up · 67 acquisitions, 14 frames shown]
[im 3/67]
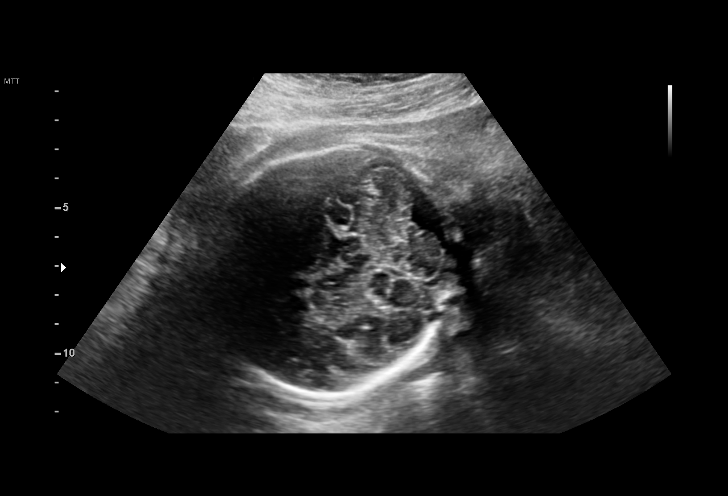
[im 8/67]
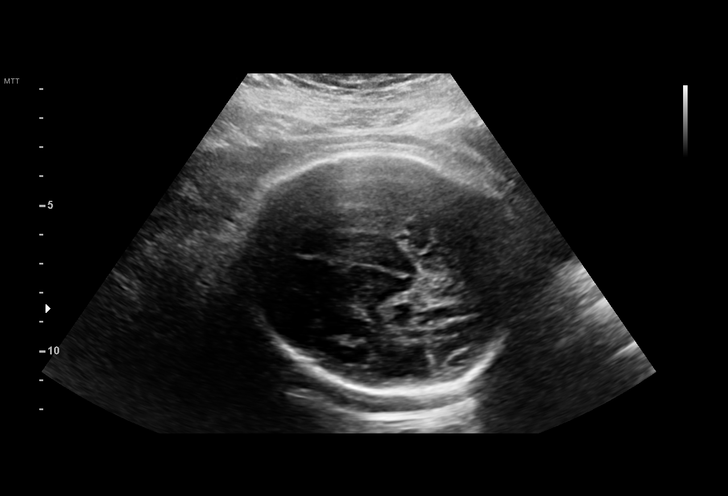
[im 13/67]
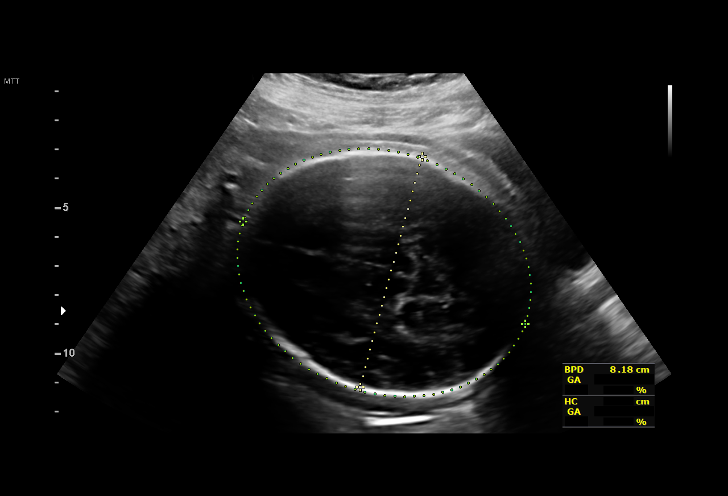
[im 18/67]
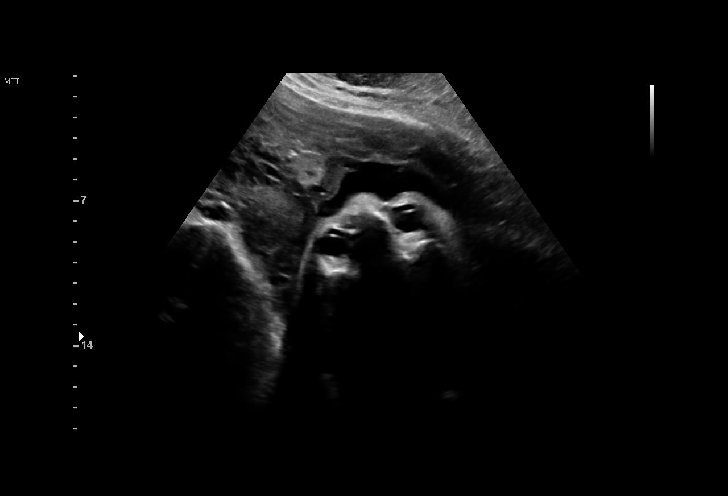
[im 23/67]
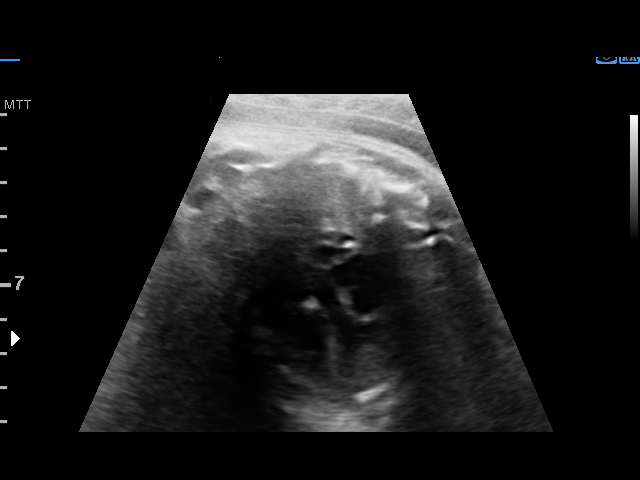
[im 27/67]
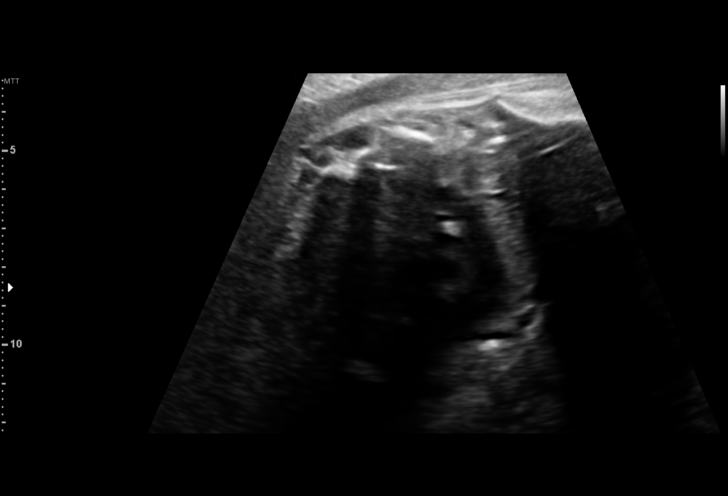
[im 32/67]
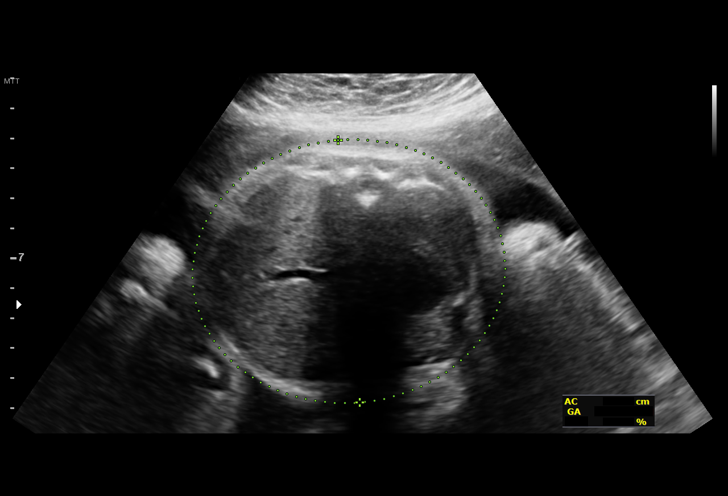
[im 37/67]
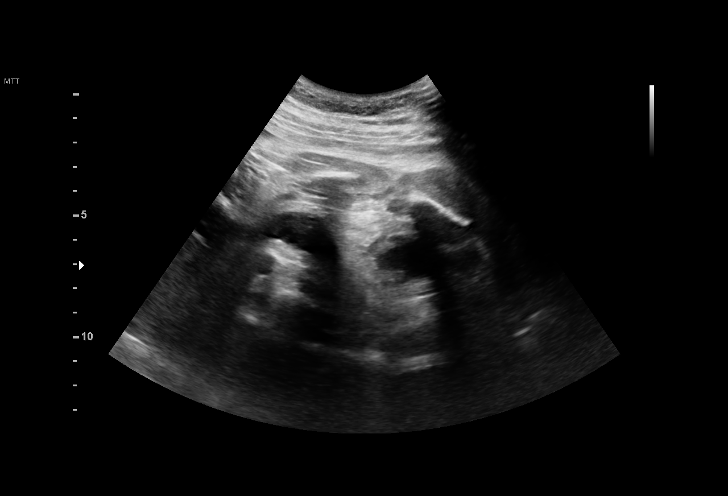
[im 42/67]
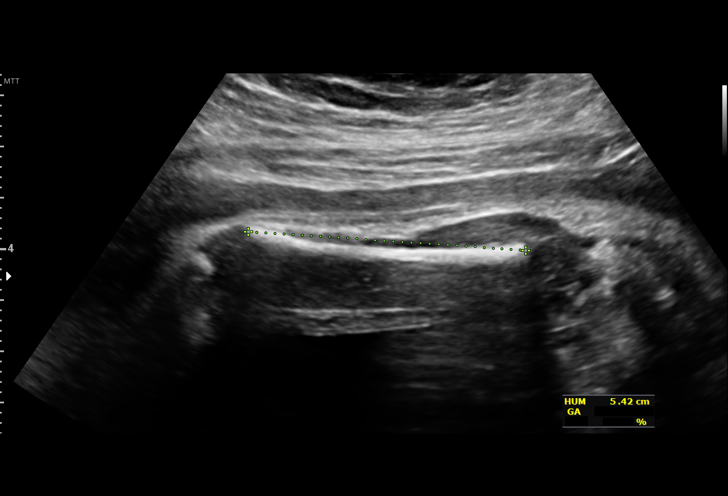
[im 47/67]
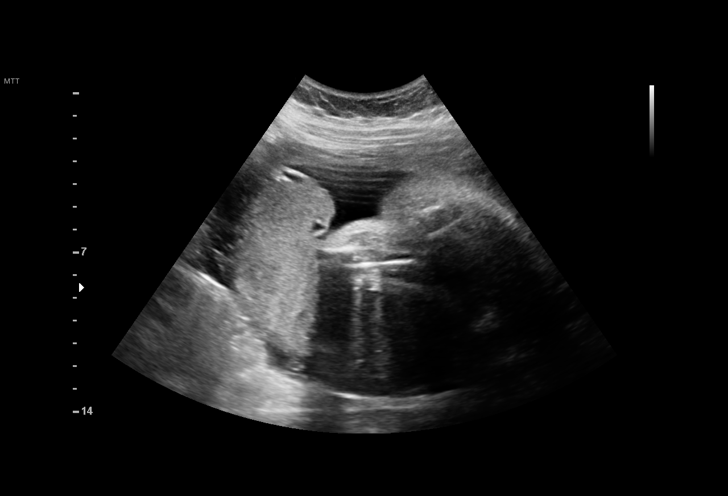
[im 52/67]
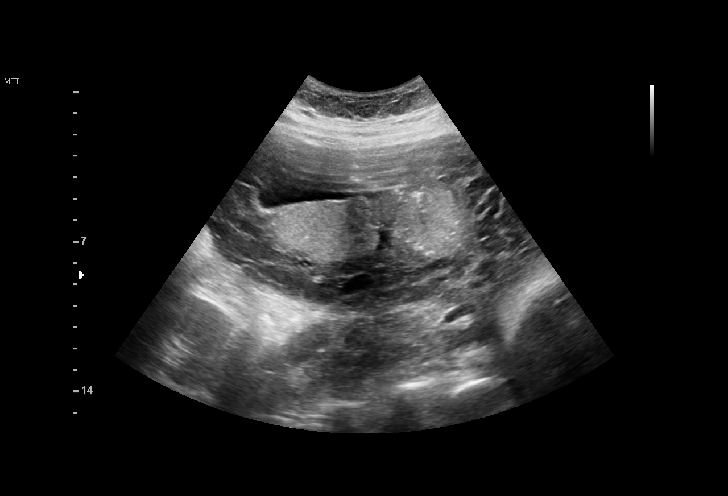
[im 57/67]
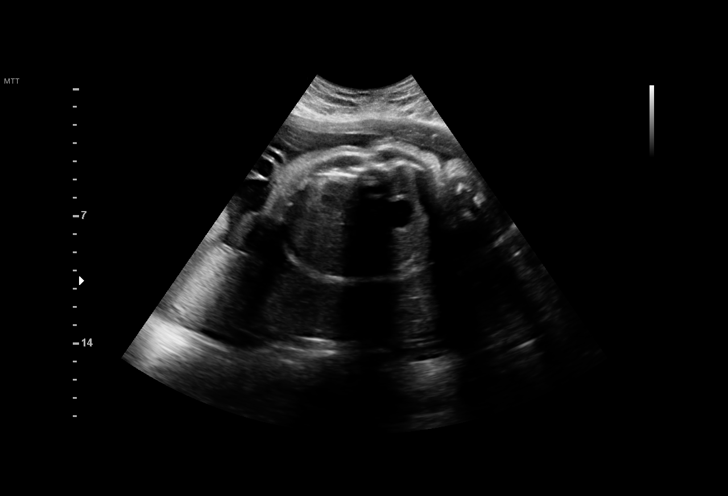
[im 62/67]
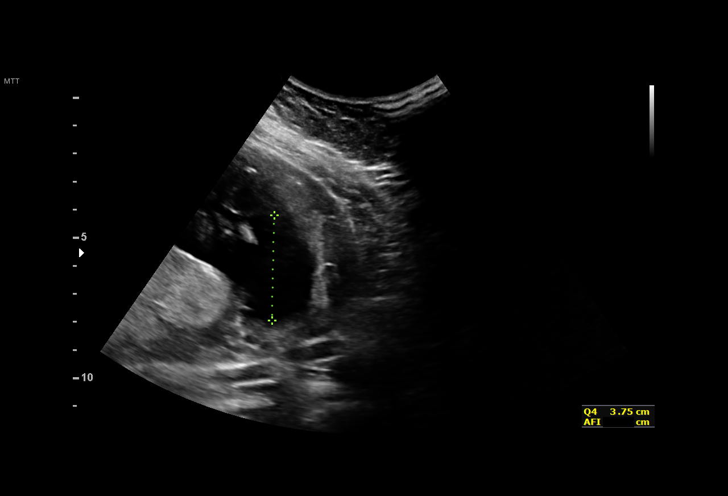
[im 67/67]
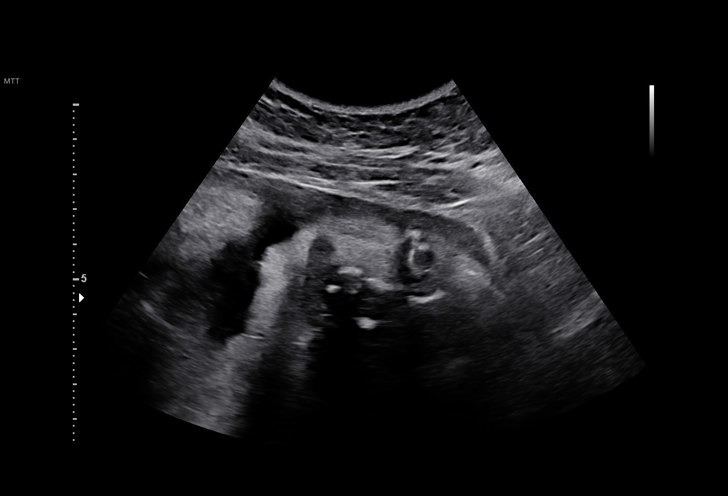

[14 of 28 positions shown; findings below may reference images not displayed]

[facility]

OB/Gyn Clinic

1  GRETHEL TRAORE            [id number]      [id number]     [id number]
Indications

32 weeks gestation of pregnancy
Hypothyroid- levothyroxine
Hypertension - Chronic/Pre-existing- ASA
Diabetes - Pregestational, 3rd trimester (
Metformin )
OB History

Blood Type:            Height:         Weight (lb):  153.8    BMI:
Gravidity:    4         Term:   1         SAB:   2
Living:       1
Fetal Evaluation

Num Of Fetuses:     1
Fetal Heart         122
Rate(bpm):
Cardiac Activity:   Observed
Presentation:       Cephalic
Placenta:           Posterior, above cervical os
P. Cord Insertion:  Previously Visualized

Amniotic Fluid
AFI FV:      Subjectively within normal limits

AFI Sum(cm)     %Tile       Largest Pocket(cm)
15.14           53
RUQ(cm)       RLQ(cm)       LUQ(cm)        LLQ(cm)
5.26
Biometry

BPD:      80.9  mm     G. Age:  32w 3d         52  %    CI:        77.41   %   70 - 86
FL/HC:      20.9   %   19.1 -
HC:      291.1  mm     G. Age:  32w 1d         15  %    HC/AC:      0.97       0.96 -
AC:      299.4  mm     G. Age:  34w 0d         91  %    FL/BPD:     75.2   %   71 - 87
FL:       60.8  mm     G. Age:  31w 4d         23  %    FL/AC:      20.3   %   20 - 24
HUM:      55.3  mm     G. Age:  32w 1d         52  %
CER:      43.1  mm     G. Age:  37w 1d       > 95  %
Est. FW:    2315  gm      4 lb 9 oz     72  %
Gestational Age

U/S Today:     32w 4d                                        EDD:   05/16/16
Best:          32w 1d    Det. By:   Early Ultrasound         EDD:   05/19/16
Anatomy

Cranium:               Appears normal         Aortic Arch:            Appears normal
Cavum:                 Previously seen        Ductal Arch:            Appears normal
Ventricles:            Appears normal         Diaphragm:              Appears normal
Choroid Plexus:        Previously seen        Stomach:                Appears normal, left
sided
Cerebellum:            Appears normal         Abdomen:                Previously seen
Posterior Fossa:       Previously seen        Abdominal Wall:         Previously seen
Nuchal Fold:           Not applicable (>20    Cord Vessels:           Previously seen
wks GA)
Face:                  Profile previously     Kidneys:                Appear normal
seen
Lips:                  Previously seen        Bladder:                Appears normal
Thoracic:              Previously seen        Spine:                  Previously seen
Heart:                 Appears normal         Upper Extremities:      Previously seen
(4CH, axis, and situs
RVOT:                  Appears normal         Lower Extremities:      Previously seen
LVOT:                  Appears normal

Other:  Female gender previously seen.. Heels previously seen. Nasal bone
previously seen. Technically difficult due to fetal position.
Cervix Uterus Adnexa

Cervix
Not visualized (advanced GA >48wks)

Uterus
No abnormality visualized.

Left Ovary
Size(cm)     3.05  x    2.93   x  1.51      Vol(ml):
Within normal limits. No adnexal mass visualized.

Right Ovary
Size(cm)     2.18  x    2.61   x  2.26      Vol(ml):
Within normal limits. No adnexal mass visualized.

Cul De Sac:   No free fluid seen.

Adnexa:       No abnormality visualized.
Impression

SIUP at 32+1 weeks
Normal interval anatomy; anatomic survey complete
Normal amniotic fluid volume
Appropriate interval growth with EFW at the 72nd %tile
Recommendations

Follow-up ultrasound for growth in 4 weeks

## 2016-04-03 ENCOUNTER — Inpatient Hospital Stay (HOSPITAL_COMMUNITY): Payer: Medicaid Other | Admitting: Anesthesiology

## 2016-04-03 ENCOUNTER — Inpatient Hospital Stay (HOSPITAL_COMMUNITY)
Admission: AD | Admit: 2016-04-03 | Discharge: 2016-04-05 | DRG: 774 | Disposition: A | Discharge status: Home / Self Care | Payer: Medicaid Other | Source: Ambulatory Visit | Attending: Obstetrics & Gynecology | Admitting: Obstetrics & Gynecology

## 2016-04-03 ENCOUNTER — Encounter (HOSPITAL_COMMUNITY): Payer: Self-pay

## 2016-04-03 DIAGNOSIS — O134 Gestational [pregnancy-induced] hypertension without significant proteinuria, complicating childbirth: Secondary | ICD-10-CM

## 2016-04-03 DIAGNOSIS — O24425 Gestational diabetes mellitus in childbirth, controlled by oral hypoglycemic drugs: Secondary | ICD-10-CM | POA: Diagnosis present

## 2016-04-03 DIAGNOSIS — Z833 Family history of diabetes mellitus: Secondary | ICD-10-CM

## 2016-04-03 DIAGNOSIS — Z3483 Encounter for supervision of other normal pregnancy, third trimester: Secondary | ICD-10-CM | POA: Diagnosis present

## 2016-04-03 DIAGNOSIS — O99284 Endocrine, nutritional and metabolic diseases complicating childbirth: Secondary | ICD-10-CM | POA: Diagnosis present

## 2016-04-03 DIAGNOSIS — Z3A33 33 weeks gestation of pregnancy: Secondary | ICD-10-CM

## 2016-04-03 DIAGNOSIS — O1002 Pre-existing essential hypertension complicating childbirth: Secondary | ICD-10-CM | POA: Diagnosis present

## 2016-04-03 DIAGNOSIS — E039 Hypothyroidism, unspecified: Secondary | ICD-10-CM | POA: Diagnosis present

## 2016-04-03 LAB — URINALYSIS, ROUTINE W REFLEX MICROSCOPIC
BILIRUBIN URINE: NEGATIVE
Glucose, UA: NEGATIVE mg/dL
KETONES UR: NEGATIVE mg/dL
LEUKOCYTES UA: NEGATIVE
NITRITE: NEGATIVE
Protein, ur: NEGATIVE mg/dL
SPECIFIC GRAVITY, URINE: 1.01 (ref 1.005–1.030)
pH: 7 (ref 5.0–8.0)

## 2016-04-03 LAB — COMPREHENSIVE METABOLIC PANEL
ALT: 22 U/L (ref 14–54)
AST: 29 U/L (ref 15–41)
Albumin: 2.9 g/dL — ABNORMAL LOW (ref 3.5–5.0)
Alkaline Phosphatase: 130 U/L — ABNORMAL HIGH (ref 38–126)
Anion gap: 9 (ref 5–15)
BUN: 6 mg/dL (ref 6–20)
CHLORIDE: 107 mmol/L (ref 101–111)
CO2: 20 mmol/L — AB (ref 22–32)
Calcium: 8.7 mg/dL — ABNORMAL LOW (ref 8.9–10.3)
Creatinine, Ser: 0.44 mg/dL (ref 0.44–1.00)
GFR calc Af Amer: >60 mL/min (ref >60)
Glucose, Bld: 108 mg/dL — ABNORMAL HIGH (ref 65–99)
POTASSIUM: 3.8 mmol/L (ref 3.5–5.1)
SODIUM: 136 mmol/L (ref 135–145)
Total Bilirubin: 0.1 mg/dL — ABNORMAL LOW (ref 0.3–1.2)
Total Protein: 6.2 g/dL — ABNORMAL LOW (ref 6.5–8.1)

## 2016-04-03 LAB — PROTEIN / CREATININE RATIO, URINE
CREATININE, URINE: 38 mg/dL
Protein Creatinine Ratio: 0.42 mg/mg{Cre} — ABNORMAL HIGH (ref 0.00–0.15)
TOTAL PROTEIN, URINE: 16 mg/dL

## 2016-04-03 LAB — CBC
HCT: 38.2 % (ref 36.0–46.0)
HEMATOCRIT: 36.4 % (ref 36.0–46.0)
HEMOGLOBIN: 12.6 g/dL (ref 12.0–15.0)
Hemoglobin: 13.3 g/dL (ref 12.0–15.0)
MCH: 33.7 pg (ref 26.0–34.0)
MCH: 33.5 pg (ref 26.0–34.0)
MCHC: 34.8 g/dL (ref 30.0–36.0)
MCHC: 34.6 g/dL (ref 30.0–36.0)
MCV: 96.7 fL (ref 78.0–100.0)
MCV: 96.8 fL (ref 78.0–100.0)
PLATELETS: 267 10*3/uL (ref 150–400)
Platelets: 243 10*3/uL (ref 150–400)
RBC: 3.95 MIL/uL (ref 3.87–5.11)
RBC: 3.76 MIL/uL — AB (ref 3.87–5.11)
RDW: 14.2 % (ref 11.5–15.5)
RDW: 14.4 % (ref 11.5–15.5)
WBC: 9.7 10*3/uL (ref 4.0–10.5)
WBC: 12.2 10*3/uL — AB (ref 4.0–10.5)

## 2016-04-03 LAB — HIV ANTIBODY (ROUTINE TESTING W REFLEX): HIV Screen 4th Generation wRfx: NONREACTIVE

## 2016-04-03 LAB — TYPE AND SCREEN
ABO/RH(D): O POS
ANTIBODY SCREEN: NEGATIVE

## 2016-04-03 LAB — RPR: RPR Ser Ql: NONREACTIVE

## 2016-04-03 LAB — URINE MICROSCOPIC-ADD ON
BACTERIA UA: NONE SEEN
WBC, UA: NONE SEEN WBC/hpf (ref 0–5)

## 2016-04-03 LAB — GLUCOSE, CAPILLARY
Glucose-Capillary: 127 mg/dL — ABNORMAL HIGH (ref 65–99)
Glucose-Capillary: 121 mg/dL — ABNORMAL HIGH (ref 65–99)

## 2016-04-03 MED ORDER — LACTATED RINGERS IV BOLUS (SEPSIS)
1000.0000 mL | Freq: Once | INTRAVENOUS | Status: AC
  Administered 2016-04-03: 1000 mL via INTRAVENOUS

## 2016-04-03 MED ORDER — OXYTOCIN 40 UNITS IN LACTATED RINGERS INFUSION - SIMPLE MED
2.5000 [IU]/h | INTRAVENOUS | Status: DC
  Filled 2016-04-03: qty 1000

## 2016-04-03 MED ORDER — BENZOCAINE-MENTHOL 20-0.5 % EX AERO
1.0000 "application " | INHALATION_SPRAY | CUTANEOUS | Status: DC | PRN
  Administered 2016-04-03: 1 via TOPICAL
  Filled 2016-04-03: qty 56

## 2016-04-03 MED ORDER — SENNOSIDES-DOCUSATE SODIUM 8.6-50 MG PO TABS
2.0000 | ORAL_TABLET | ORAL | Status: DC
  Administered 2016-04-03 – 2016-04-04 (×2): 2 via ORAL
  Filled 2016-04-03 (×2): qty 2

## 2016-04-03 MED ORDER — EPHEDRINE 5 MG/ML INJ
10.0000 mg | INTRAVENOUS | Status: DC | PRN
  Filled 2016-04-03: qty 4

## 2016-04-03 MED ORDER — ACETAMINOPHEN 325 MG PO TABS: 650.0000 mg | ORAL_TABLET | ORAL | Status: DC | PRN

## 2016-04-03 MED ORDER — METFORMIN HCL 500 MG PO TABS
1000.0000 mg | ORAL_TABLET | Freq: Two times a day (BID) | ORAL | Status: DC
  Filled 2016-04-03 (×2): qty 2

## 2016-04-03 MED ORDER — LACTATED RINGERS IV SOLN
500.0000 mL | INTRAVENOUS | Status: DC | PRN
  Administered 2016-04-03: 500 mL via INTRAVENOUS

## 2016-04-03 MED ORDER — LEVOTHYROXINE SODIUM 25 MCG PO TABS
12.5000 ug | ORAL_TABLET | Freq: Every day | ORAL | Status: DC
  Filled 2016-04-03 (×2): qty 0.5

## 2016-04-03 MED ORDER — ONDANSETRON HCL 4 MG/2ML IJ SOLN: 4.0000 mg | INTRAMUSCULAR | Status: DC | PRN

## 2016-04-03 MED ORDER — ONDANSETRON HCL 4 MG PO TABS: 4.0000 mg | ORAL_TABLET | ORAL | Status: DC | PRN

## 2016-04-03 MED ORDER — TETANUS-DIPHTH-ACELL PERTUSSIS 5-2.5-18.5 LF-MCG/0.5 IM SUSP: 0.5000 mL | Freq: Once | INTRAMUSCULAR | Status: DC

## 2016-04-03 MED ORDER — METFORMIN HCL 500 MG PO TABS
500.0000 mg | ORAL_TABLET | Freq: Two times a day (BID) | ORAL | Status: DC
  Administered 2016-04-03 – 2016-04-05 (×4): 500 mg via ORAL
  Filled 2016-04-03 (×4): qty 1

## 2016-04-03 MED ORDER — FENTANYL 2.5 MCG/ML BUPIVACAINE 1/10 % EPIDURAL INFUSION (WH - ANES)
INTRAMUSCULAR | Status: AC
  Administered 2016-04-03: 07:00:00
  Filled 2016-04-03: qty 125

## 2016-04-03 MED ORDER — LIDOCAINE HCL (PF) 1 % IJ SOLN
INTRAMUSCULAR | Status: DC | PRN
  Administered 2016-04-03: 6 mL via EPIDURAL
  Administered 2016-04-03: 4 mL

## 2016-04-03 MED ORDER — NIFEDIPINE 10 MG PO CAPS: 20.0000 mg | ORAL_CAPSULE | Freq: Once | ORAL | Status: DC

## 2016-04-03 MED ORDER — ONDANSETRON HCL 4 MG/2ML IJ SOLN
4.0000 mg | Freq: Four times a day (QID) | INTRAMUSCULAR | Status: DC | PRN
Start: 2016-04-03 — End: 2016-04-03

## 2016-04-03 MED ORDER — OXYCODONE-ACETAMINOPHEN 5-325 MG PO TABS: 2.0000 | ORAL_TABLET | ORAL | Status: DC | PRN

## 2016-04-03 MED ORDER — ZOLPIDEM TARTRATE 5 MG PO TABS: 5.0000 mg | ORAL_TABLET | Freq: Every evening | ORAL | Status: DC | PRN

## 2016-04-03 MED ORDER — IBUPROFEN 600 MG PO TABS
600.0000 mg | ORAL_TABLET | Freq: Four times a day (QID) | ORAL | Status: DC
  Administered 2016-04-03 – 2016-04-05 (×7): 600 mg via ORAL
  Filled 2016-04-03 (×7): qty 1

## 2016-04-03 MED ORDER — COCONUT OIL OIL: 1.0000 "application " | TOPICAL_OIL | Status: DC | PRN

## 2016-04-03 MED ORDER — PENICILLIN G POTASSIUM 5000000 UNITS IJ SOLR
5.0000 10*6.[IU] | Freq: Once | INTRAVENOUS | Status: DC
  Filled 2016-04-03: qty 5

## 2016-04-03 MED ORDER — OXYTOCIN BOLUS FROM INFUSION: 500.0000 mL | Freq: Once | INTRAVENOUS | Status: DC

## 2016-04-03 MED ORDER — BETAMETHASONE SOD PHOS & ACET 6 (3-3) MG/ML IJ SUSP
12.0000 mg | Freq: Once | INTRAMUSCULAR | Status: AC
  Administered 2016-04-03: 12 mg via INTRAMUSCULAR
  Filled 2016-04-03: qty 2

## 2016-04-03 MED ORDER — PHENYLEPHRINE 40 MCG/ML (10ML) SYRINGE FOR IV PUSH (FOR BLOOD PRESSURE SUPPORT)
80.0000 ug | PREFILLED_SYRINGE | INTRAVENOUS | Status: DC | PRN
  Filled 2016-04-03: qty 5

## 2016-04-03 MED ORDER — PRENATAL MULTIVITAMIN CH
1.0000 | ORAL_TABLET | Freq: Every day | ORAL | Status: DC
  Administered 2016-04-04: 1 via ORAL
  Filled 2016-04-03: qty 1

## 2016-04-03 MED ORDER — LIDOCAINE HCL (PF) 1 % IJ SOLN
30.0000 mL | INTRAMUSCULAR | Status: DC | PRN
  Filled 2016-04-03: qty 30

## 2016-04-03 MED ORDER — FENTANYL 2.5 MCG/ML BUPIVACAINE 1/10 % EPIDURAL INFUSION (WH - ANES)
14.0000 mL/h | INTRAMUSCULAR | Status: DC | PRN
  Administered 2016-04-03: 14 mL/h via EPIDURAL

## 2016-04-03 MED ORDER — PHENYLEPHRINE 40 MCG/ML (10ML) SYRINGE FOR IV PUSH (FOR BLOOD PRESSURE SUPPORT)
PREFILLED_SYRINGE | INTRAVENOUS | Status: AC
  Filled 2016-04-03: qty 10

## 2016-04-03 MED ORDER — OXYCODONE-ACETAMINOPHEN 5-325 MG PO TABS: 1.0000 | ORAL_TABLET | ORAL | Status: DC | PRN

## 2016-04-03 MED ORDER — LEVOTHYROXINE SODIUM 50 MCG PO TABS
50.0000 ug | ORAL_TABLET | Freq: Every day | ORAL | Status: DC
  Filled 2016-04-03 (×2): qty 1

## 2016-04-03 MED ORDER — LACTATED RINGERS IV SOLN
500.0000 mL | Freq: Once | INTRAVENOUS | Status: AC
  Administered 2016-04-03: 500 mL via INTRAVENOUS

## 2016-04-03 MED ORDER — SOD CITRATE-CITRIC ACID 500-334 MG/5ML PO SOLN: 30.0000 mL | ORAL | Status: DC | PRN

## 2016-04-03 MED ORDER — LACTATED RINGERS IV SOLN: INTRAVENOUS | Status: DC

## 2016-04-03 MED ORDER — DIBUCAINE 1 % RE OINT: 1.0000 "application " | TOPICAL_OINTMENT | RECTAL | Status: DC | PRN

## 2016-04-03 MED ORDER — WITCH HAZEL-GLYCERIN EX PADS: 1.0000 "application " | MEDICATED_PAD | CUTANEOUS | Status: DC | PRN

## 2016-04-03 MED ORDER — DIPHENHYDRAMINE HCL 25 MG PO CAPS: 25.0000 mg | ORAL_CAPSULE | Freq: Four times a day (QID) | ORAL | Status: DC | PRN

## 2016-04-03 MED ORDER — SODIUM CHLORIDE 0.9 % IV SOLN
2.0000 g | Freq: Once | INTRAVENOUS | Status: AC
  Administered 2016-04-03: 2 g via INTRAVENOUS
  Filled 2016-04-03: qty 2000

## 2016-04-03 MED ORDER — DIPHENHYDRAMINE HCL 50 MG/ML IJ SOLN: 12.5000 mg | INTRAMUSCULAR | Status: DC | PRN

## 2016-04-03 MED ORDER — SIMETHICONE 80 MG PO CHEW
80.0000 mg | CHEWABLE_TABLET | ORAL | Status: DC | PRN
Start: 2016-04-03 — End: 2016-04-05

## 2016-04-03 MED ORDER — PENICILLIN G POTASSIUM 5000000 UNITS IJ SOLR
2.5000 10*6.[IU] | INTRAVENOUS | Status: DC
  Filled 2016-04-03: qty 2.5

## 2016-04-03 MED ORDER — LEVOTHYROXINE SODIUM 50 MCG PO TABS
50.0000 ug | ORAL_TABLET | Freq: Every day | ORAL | Status: DC
  Administered 2016-04-04 – 2016-04-05 (×2): 50 ug via ORAL
  Filled 2016-04-03 (×2): qty 1

## 2016-04-03 NOTE — Progress Notes (Signed)
Pt evaluated at beginning of shift. Pt with pre-term labor and vag bleeding. CAtegory 1 tracing. Pt is complete and laboring down. 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 Sugar now is 127. Has not been check in labor thus far. Will recheck in 1 hour. Monitor closely. Bp have been wnl. Expect vag delivery. D/W pt that her baby will be transferred post partum and she will be discharged as soon as it is safe.

## 2016-04-03 NOTE — Lactation Note (Signed)
This note was copied from a baby's chart. Lactation Consultation Note  Patient [facility] Shawnie DapperLopez WUJWJ'XToday's Date: 04/03/2016 Reason for consult: Initial assessment;NICU baby  Symphony pump assembled and initiated. Instructed to pump every 3 hours for 15 minutes.   Reviewed colostrum and milk coming to volume.  [facility] will [facility] sent for [facility] after discharge.  Maternal Data Does the patient have breastfeeding experience prior to this delivery?: Yes  Feeding    LATCH Score/Interventions                      Lactation Tools Discussed/Used WIC Program: Yes Pump Review: Setup, frequency, and cleaning;Milk Storage Initiated by:: LC Date initiated:: 04/03/16   Consult Status Consult Status: Follow-up Date: 04/04/16 Follow-up type: In-patient    Huston FoleyMOULDEN, [name] 04/03/2016, 4:47 PM

## 2016-04-03 NOTE — MAU Note (Addendum)
Neonatologist notified of pt arrival in MAU and labor status. CNM feels pt is unstable for transfer to another facility. Neonatologist aware but baby will be transferred.

## 2016-04-03 NOTE — H&P (Signed)
[facility] Shawnie DapperLopez is a 25 y.o. [phone] at 6950w3d who presents today with contractions. She states that contractions began last evening, but about 2 hours ago they became much stronger and closer together. She also started bleeding about 2 hours ago. She denies nay gush of fluid. She states that baby has been moving normally. Patient is CHTN and DMII. She is on labetalol and metformin. She is also taking baby asa. She denies any HA, visual disturbances or epigastric pain.   Vaginal Bleeding  The patient's primary symptoms include pelvic pain and vaginal bleeding. This is a new problem. The current episode started today. The problem occurs intermittently. The problem has been gradually worsening. The pain is severe. The problem affects both sides. She is pregnant. The vaginal discharge was bloody. The vaginal bleeding is typical of menses. She has not been passing clots. She has not been passing tissue. Nothing aggravates the symptoms. She has tried nothing for the symptoms.       Past Medical History:  Diagnosis Date  . Diabetes mellitus without complication (HCC)   . Hypothyroidism   . Pregnancy induced hypertension     History reviewed. No pertinent surgical history.       Family History  Problem Relation Age of Onset  . Diabetes Mother   . Asthma Mother   . Diabetes Father     Social History  Substance Use Topics  . Smoking status: Never Smoker  . Smokeless tobacco: Never Used  . Alcohol use No    Allergies: No Known Allergies         Prescriptions Prior to Admission  Medication Sig Dispense Refill Last Dose  . aspirin EC 81 MG tablet Take 1 tablet (81 mg total) by mouth daily. 30 tablet 6 Taking  . levothyroxine (LEVOTHROID) 25 MCG tablet Take 0.5 tablets (12.5 mcg total) by mouth daily before breakfast. 30 tablet 3 Taking  . levothyroxine (SYNTHROID, LEVOTHROID) 50 MCG tablet Take 1 tablet (50 mcg total) by mouth daily before breakfast. 30 tablet 2 Taking   . metFORMIN (GLUCOPHAGE) 500 MG tablet Take 1 tablet (500 mg total) by mouth 2 (two) times daily with a meal. Pt states she is taking 2 tablets twice daily 180 tablet 1 Taking  . Prenatal Vit-Fe Fumarate-FA (PRENATAL VITAMINS PLUS) 27-1 MG TABS Take 1 tablet by mouth once. 30 tablet 2 Taking    Review of Systems  Genitourinary: Positive for pelvic pain and vaginal bleeding.   Physical Exam   Blood pressure 159/91, pulse 83, temperature 98 F (36.7 C), temperature source Oral, resp. rate 18, not currently breastfeeding.  Physical Exam  Nursing note and vitals reviewed. Constitutional: She is oriented to person, place, and time. She appears well-developed and well-nourished.  Breathing through contractions   Cardiovascular: Normal rate.   Respiratory: Effort normal.  GI: Soft. There is no tenderness. There is no rebound.  Genitourinary:  Genitourinary Comments: Cervix: 4-5/100/-1/0 +bloody show  Neurological: She is alert and oriented to person, place, and time.  Skin: Skin is warm and dry.  Psychiatric: She has a normal mood and affect.   FHT: 145, moderate with 15x15 accels, no decels Toco: q 2-3 min contractions  MAU Course  Procedures  MDM RN spoke NICU.   Assessment and Plan  Preterm labor Admit to labor and delivery NICU plans to transfer baby after delivery BMZ now CBC/CMET/UA/UPC pending   Tawnya CrookHogan, Ronen Bromwell Donovan  6:47 AM 04/03/16

## 2016-04-03 NOTE — Lactation Note (Addendum)
This note was copied from a baby's chart. Lactation Consultation Note  Patient Name: Regina Jones Today's Date: 04/03/2016 Reason for consult: Initial assessment  Mother getting ready to be transferred to 3rd floor. Spanish interpreter present. Baby 2 hours old and NICU.  P2.  Mother states she bf and pumped for 4 months.   Mother states her first baby had difficulty swallowing at the breast - lost in translation. Encouraged her to pump to provide beneficial breastmilk to baby. Mother asked questions about milk transportation. Reviewed LC brochure and provided her with NICU booklet to read.    Maternal Data Does the patient have breastfeeding experience prior to this delivery?: Yes  Feeding    LATCH Score/Interventions                      Lactation Tools Discussed/Used     Consult Status Consult Status: Follow-up Date: 04/04/16 Follow-up type: In-patient    Dahlia ByesBerkelhammer, Ruth [facility] 04/03/2016, 3:33 PM

## 2016-04-03 NOTE — Anesthesia Pain Management Evaluation Note (Signed)
  CRNA Pain Management Visit Note  Patient: Regina Jones, 25 y.o., female  "Hello [facility] am [facility]. We have an anesthesia team available at all times to provide care throughout the hospital, including epidural management and anesthesia for C-section. I don't know your plan for the delivery whether it a natural birth, water birth, IV sedation, nitrous supplementation, doula or epidural, but we want to meet your pain goals."   1.Was your pain managed to your expectations on prior hospitalizations?   Yes   2.What is your expectation for pain management during this hospitalization?     Epidural  3.How can we help you reach that goal? Epidural in situ.  Record the patient's initial score and the patient's pain goal.   Pain: 2  Pain Goal: 3 [facility] wants you to be able to say your pain was always managed very well.  Wilfred Siverson L 04/03/2016

## 2016-04-03 NOTE — Progress Notes (Signed)
I assisted RN Windell MouldingRuth from Lactation with information about pumping every 3 hours, RN Tanya with explanation of care and RN Abby with admission questions, I ordered her meals, by Orlan LeavensViria Alvarez Spanish interpreter

## 2016-04-03 NOTE — MAU Provider Note (Signed)
History     CSN: [id number]  Arrival date and time: 04/03/16 4098   First Provider Initiated Contact with Patient 04/03/16 [phone]      Chief Complaint  Patient presents with  . Contractions  . Vaginal Bleeding   Regina Jones is a 25 y.o. [phone] at [redacted]w[redacted]d who presents today with contractions. She states that contractions began last evening, but about 2 hours ago they became much stronger and closer together. She also started bleeding about 2 hours ago. She denies nay gush of fluid. She states that baby has been moving normally. Patient is CHTN and DMII. She is on labetalol and metformin. She is also taking baby asa. She denies any HA, visual disturbances or epigastric pain.    Vaginal Bleeding  The patient's primary symptoms include pelvic pain and vaginal bleeding. This is a new problem. The current episode started today. The problem occurs intermittently. The problem has been gradually worsening. The pain is severe. The problem affects both sides. She is pregnant. The vaginal discharge was bloody. The vaginal bleeding is typical of menses. She has not been passing clots. She has not been passing tissue. Nothing aggravates the symptoms. She has tried nothing for the symptoms.   Past Medical History:  Diagnosis Date  . Diabetes mellitus without complication (HCC)   . Hypothyroidism   . Pregnancy induced hypertension     History reviewed. No pertinent surgical history.  Family History  Problem Relation Age of Onset  . Diabetes Mother   . Asthma Mother   . Diabetes Father     Social History  Substance Use Topics  . Smoking status: Never Smoker  . Smokeless tobacco: Never Used  . Alcohol use No    Allergies: No Known Allergies  Prescriptions Prior to Admission  Medication Sig Dispense Refill Last Dose  . aspirin EC 81 MG tablet Take 1 tablet (81 mg total) by mouth daily. 30 tablet 6 Taking  . levothyroxine (LEVOTHROID) 25 MCG tablet Take 0.5 tablets (12.5 mcg  total) by mouth daily before breakfast. 30 tablet 3 Taking  . levothyroxine (SYNTHROID, LEVOTHROID) 50 MCG tablet Take 1 tablet (50 mcg total) by mouth daily before breakfast. 30 tablet 2 Taking  . metFORMIN (GLUCOPHAGE) 500 MG tablet Take 1 tablet (500 mg total) by mouth 2 (two) times daily with a meal. Pt states she is taking 2 tablets twice daily 180 tablet 1 Taking  . Prenatal Vit-Fe Fumarate-FA (PRENATAL VITAMINS PLUS) 27-1 MG TABS Take 1 tablet by mouth once. 30 tablet 2 Taking    Review of Systems  Genitourinary: Positive for pelvic pain and vaginal bleeding.   Physical Exam   Blood pressure 159/91, pulse 83, temperature 98 F (36.7 C), temperature source Oral, resp. rate 18, not currently breastfeeding.  Physical Exam  Nursing note and vitals reviewed. Constitutional: She is oriented to person, place, and time. She appears well-developed and well-nourished.  Breathing through contractions   Cardiovascular: Normal rate.   Respiratory: Effort normal.  GI: Soft. There is no tenderness. There is no rebound.  Genitourinary:  Genitourinary Comments: Cervix: 4-5/100/-1/0 +bloody show  Neurological: She is alert and oriented to person, place, and time.  Skin: Skin is warm and dry.  Psychiatric: She has a normal mood and affect.   FHT: 145, moderate with 15x15 accels, no decels Toco: q 2-3 min contractions  MAU Course  Procedures  MDM RN spoke NICU.   Assessment and Plan  Preterm labor Admit to labor and delivery NICU  plans to transfer baby after delivery BMZ now CBC/CMET/UA/UPC pending   Tawnya CrookHogan, Regina Jones 04/03/2016, 6:09 AM

## 2016-04-03 NOTE — MAU Note (Signed)
Pt presents complaining of contractions every 5 minutes that started last night and got worse around 330 this am. Also having some vaginal bleeding. Bright red bleeding noted on pad. Reports good fetal movement.

## 2016-04-03 NOTE — Anesthesia Procedure Notes (Signed)
Epidural Patient location during procedure: OB  Preanesthetic Checklist Completed: patient identified, site marked, surgical consent, pre-op evaluation, timeout performed, IV checked, risks and benefits discussed and monitors and equipment checked  Epidural Patient position: sitting Prep: site prepped and draped and DuraPrep Patient monitoring: continuous pulse ox and blood pressure Approach: midline Location: L3-L4 Injection technique: LOR air  Needle:  Needle type: Tuohy  Needle gauge: 17 G Needle length: 9 cm and 9 Needle insertion depth: 4 cm Catheter type: closed end flexible Catheter size: 19 Gauge Catheter at skin depth: 10 cm Test dose: negative  Assessment Events: blood not aspirated, injection not painful, no injection resistance, negative IV test and no paresthesia  Additional Notes Dosing of Epidural:  1st dose, through catheter .............................................  Xylocaine 40 mg  2nd dose, through catheter, after waiting 3 minutes.........Xylocaine 60 mg    As each dose occurred, patient was free of IV sx; and patient exhibited no evidence of SA injection.  Patient is more comfortable after epidural dosed. Please see RN's note for documentation of vital signs,and FHR which are stable.  Patient reminded not to try to ambulate with numb legs, and that an RN must be present when she attempts to get up.       

## 2016-04-03 NOTE — Anesthesia Preprocedure Evaluation (Signed)
Anesthesia Evaluation  Patient identified by MRN, date of birth, ID band Patient awake    Reviewed: Allergy & Precautions, H&P , Patient's Chart, lab work & pertinent test results  Airway Mallampati: II  TM Distance: >3 FB Neck ROM: full    Dental  (+) Teeth Intact   Pulmonary    breath sounds clear to auscultation       Cardiovascular hypertension,  Rhythm:regular Rate:Normal     Neuro/Psych    GI/Hepatic   Endo/Other  diabetes, GestationalHypothyroidism   Renal/GU      Musculoskeletal   Abdominal   Peds  Hematology   Anesthesia Other Findings       Reproductive/Obstetrics (+) Pregnancy                             Anesthesia Physical Anesthesia Plan  ASA: II  Anesthesia Plan: Epidural   Post-op Pain Management:    Induction:   Airway Management Planned:   Additional Equipment:   Intra-op Plan:   Post-operative Plan:   Informed Consent: I have reviewed the patients History and Physical, chart, labs and discussed the procedure including the risks, benefits and alternatives for the proposed anesthesia with the patient or authorized representative who has indicated his/her understanding and acceptance.   Dental Advisory Given  Plan Discussed with:   Anesthesia Plan Comments: (Labs checked- platelets confirmed with RN in room. Fetal heart tracing, per RN, reported to be stable enough for sitting procedure. Discussed epidural, and patient consents to the procedure:  included risk of possible headache,backache, failed block, allergic reaction, and nerve injury. This patient was asked if she had any questions or concerns before the procedure started.)        Anesthesia Quick Evaluation

## 2016-04-04 NOTE — Anesthesia Postprocedure Evaluation (Signed)
Anesthesia Post Note  Patient: Regina Jones  Procedure(s) Performed: * No procedures listed *  Patient location during evaluation: [facility] Anesthesia Type: Epidural Level of consciousness: awake Pain management: pain level controlled Vital Signs Assessment: post-procedure vital signs reviewed and stable Respiratory status: spontaneous breathing Cardiovascular status: stable Postop Assessment: no headache, no backache, epidural receding and patient able to bend at knees Anesthetic complications: no     Last Vitals:  Vitals:   04/03/16 2149 04/04/16 0629  BP: 119/80 115/75  Pulse: 86 66  Resp: 18 18  Temp: 36.6 C 36.7 C    Last Pain:  Vitals:   04/04/16 0657  TempSrc:   PainSc: 0-No pain   Pain Goal: Patients Stated Pain Goal: 2 (04/04/16 0536)               Edison PaceWILKERSON,[name]

## 2016-04-04 NOTE — Progress Notes (Signed)
Post Partum Day 1 Subjective: no complaints, up ad lib, voiding, tolerating PO and + flatus  Objective: Blood pressure 115/75, pulse 66, temperature 98 F (36.7 C), temperature source Oral, resp. rate 18, height 5\' 4"  (1.626 m), weight 69.9 kg (154 lb), SpO2 98 %, unknown if currently breastfeeding.  Physical Exam:  General: alert, cooperative and no distress Lochia: appropriate Uterine Fundus: firm DVT Evaluation: No evidence of DVT seen on physical exam. Negative Homan's sign.   Recent Labs  04/03/16 0602 04/03/16 1346  HGB 13.3 12.6  HCT 38.2 36.4    Assessment/Plan: Plan for discharge tomorrow   LOS: 1 day   Leland Herlsia J Amaiah Cristiano 04/04/2016, 8:05 AM

## 2016-04-04 NOTE — Lactation Note (Signed)
This note was copied from a baby'Jones chart. Lactation Consultation Note  Patient Name: Regina Jones Today'Jones Date: 04/04/2016  Mom just pumped a few mls of colostrum from each breast.  [facility] and will pick up a breast pump Monday.  Discussed pump loaner and she would like to loan pump prior to discharge.  Paper work given to fill out.   Maternal Data    Feeding Feeding Type: Formula Length of feed: 30 min  LATCH Score/Interventions                      Lactation Tools Discussed/Used     Consult Status      Regina Jones, Regina Jones 04/04/2016, 3:14 PM

## 2016-04-05 MED ORDER — IBUPROFEN 600 MG PO TABS: 600.0000 mg | ORAL_TABLET | Freq: Four times a day (QID) | ORAL | 0 refills | Status: AC

## 2016-04-05 NOTE — Discharge Summary (Signed)
OB Discharge Summary     Patient Name: Blake Divinena Maria Angeles Lopez DOB: 02/06/1991 MRN: [id number]  Date of admission: 04/03/2016 Delivering MD: Lorne SkeensSCHENK, NICHOLAS Alok Minshall   Date of discharge: 04/05/2016  Admitting diagnosis: 33 WEEKS CTX BLEEDING Intrauterine pregnancy: 7963w3d     Secondary diagnosis:  Active Problems:   Preterm labor  Additional problems: GHTN, GDM, Hypothyroid     Discharge diagnosis: Preterm Pregnancy Delivered, GDM A1 and hypothyroidism                                                                                                Post partum procedures:none  Augmentation: Pitocin  Complications: None  Hospital course:  04/03/2016  Pt presented in PTL at 33 weeks. SVD without problems. Postpartum course was unremarkable. Progressed to ambulating and voiding without problems. Tolerating diet. Pain controlled. DM controlled with Metformin. BP stable without meds. Felt amendable for d/c home on PPD # 2 Information for the patient's newborn:  Lynann Beaverngeles Lopez, Girl Darien Ramusna [[id number]][[id number]]       Pateint had an uncomplicated postpartum course.  She is ambulating, tolerating a regular diet, passing flatus, and urinating well. Patient is discharged home in stable condition on 04/05/16.    Physical exam Vitals:   04/04/16 1301 04/04/16 1731 04/04/16 2154 04/05/16 0537  BP: 114/71 126/86 123/76 127/79  Pulse: 98 72 74 77  Resp: 16 18 18 18   Temp: 98.3 F (36.8 C) 98.3 F (36.8 C) 98.7 F (37.1 C) 97.9 F (36.6 C)  TempSrc: Oral Oral Oral Oral  SpO2: 100% 100% 97% 99%  Weight:      Height:       General: alert Lochia: appropriate Uterine Fundus: firm Incision: NA DVT Evaluation: No evidence of DVT seen on physical exam. Labs: Lab Results  Component Value Date   WBC 12.2 (H) 04/03/2016   HGB 12.6 04/03/2016   HCT 36.4 04/03/2016   MCV 96.8 04/03/2016   PLT 243 04/03/2016   CMP Latest Ref Rng & Units 04/03/2016  Glucose 65 - 99 mg/dL 841(L108(H)  BUN 6 - 20 mg/dL  6  Creatinine 2.440.44 - 0.101.00 mg/dL 2.720.44  Sodium 536135 - 644145 mmol/L 136  Potassium 3.5 - 5.1 mmol/L 3.8  Chloride 101 - 111 mmol/L 107  CO2 22 - 32 mmol/L 20(L)  Calcium 8.9 - 10.3 mg/dL 0.3(K8.7(L)  Total Protein 6.5 - 8.1 g/dL 6.2(L)  Total Bilirubin 0.3 - 1.2 mg/dL 7.4(Q0.1(L)  Alkaline Phos 38 - 126 U/L 130(H)  AST 15 - 41 U/L 29  ALT 14 - 54 U/L 22    Discharge instruction: per After Visit Summary and "Baby and Me Booklet".    Diet: routine diet  Activity: Advance as tolerated. Pelvic rest for 6 weeks.   Outpatient follow up:6 weeks Follow up Appt:Future Appointments Date Time Provider Department Center  04/24/2016 9:30 AM [facility] US 1 [facility]   Follow up Visit:No Follow-up on file.  Postpartum contraception: None  Newborn Data: Live born female  Birth Weight: 5 lb 11.2 oz (2585 g) APGAR: 7, 8  Baby Feeding: Breast Disposition:home with mother  04/05/2016 Hermina Staggers, MD

## 2016-04-05 NOTE — Progress Notes (Signed)
All discharge instructions reviewed with patient per spanish interpreter.

## 2016-04-05 NOTE — Lactation Note (Signed)
This note was copied from a baby's chart. Lactation Consultation Note  Patient Name: Regina Jones ZOXWR'UToday's Date: 04/05/2016 Reason for consult: Follow-up assessment;Pump rental;Infant < 6lbs;Late preterm infant Infant is 1045 hours old & seen by Lactation for follow-up. Interpreter present during entire consult. [facility] so collected that as well as $30 and showed mom how to use pump- explained that once she has more milk volume she will not need to use the initiate setting. Mom has all pump parts to go home with as well as bottles. Mom reports she has been pumping q 3hrs (except she missed 1x @ night). Mom received ~1310mL this last time.  Encouraged mom to continue pumping q 3hrs for 15 mins followed by hand expressing for ~5 mins. Reviewed milk storage guidelines & Outpatient Lactation phone number if she has questions. Pt has appt with [facility] on 10/3 to get pump. Pt reports no questions at this time.  Maternal Data    Feeding Feeding Type: Formula Length of feed: 30 min  LATCH Score/Interventions                      Lactation Tools Discussed/Used WIC Program: Yes Pump Review: Setup, frequency, and cleaning;Milk Storage   Consult Status Consult Status: Complete    Oneal GroutLaura C Lavana Huckeba 04/05/2016, 11:14 AM

## 2016-04-05 NOTE — Progress Notes (Signed)

## 2016-04-09 ENCOUNTER — Encounter: Payer: Medicaid Other | Admitting: Obstetrics and Gynecology

## 2016-04-24 ENCOUNTER — Encounter (HOSPITAL_COMMUNITY): Payer: Self-pay

## 2016-04-24 ENCOUNTER — Ambulatory Visit (HOSPITAL_COMMUNITY): Payer: Medicaid Other

## 2016-05-22 ENCOUNTER — Ambulatory Visit (INDEPENDENT_AMBULATORY_CARE_PROVIDER_SITE_OTHER): Payer: Medicaid Other | Admitting: Medical

## 2016-05-22 ENCOUNTER — Encounter: Payer: Self-pay | Admitting: Medical

## 2016-05-22 DIAGNOSIS — I10 Essential (primary) hypertension: Secondary | ICD-10-CM

## 2016-05-22 DIAGNOSIS — Z8751 Personal history of pre-term labor: Secondary | ICD-10-CM

## 2016-05-22 DIAGNOSIS — Z3202 Encounter for pregnancy test, result negative: Secondary | ICD-10-CM | POA: Diagnosis not present

## 2016-05-22 DIAGNOSIS — E119 Type 2 diabetes mellitus without complications: Secondary | ICD-10-CM

## 2016-05-22 DIAGNOSIS — Z3042 Encounter for surveillance of injectable contraceptive: Secondary | ICD-10-CM

## 2016-05-22 LAB — POCT PREGNANCY, URINE: PREG TEST UR: NEGATIVE

## 2016-05-22 MED ORDER — MEDROXYPROGESTERONE ACETATE 150 MG/ML IM SUSP: 150.0000 mg | INTRAMUSCULAR | 0 refills | Status: DC

## 2016-05-22 MED ORDER — MEDROXYPROGESTERONE ACETATE 150 MG/ML IM SUSP
150.0000 mg | Freq: Once | INTRAMUSCULAR | Status: AC
  Administered 2016-05-22: 150 mg via INTRAMUSCULAR

## 2016-05-22 NOTE — Patient Instructions (Signed)
Elección del método anticonceptivo  (Contraception Choices)  La anticoncepción (control de la natalidad) es el uso de cualquier método o dispositivo para evitar el embarazo. A continuación se indican algunos de esos métodos.  ANTICONCEPTIVOS HORMONALES  · Un pequeño tubo colocado bajo la piel de la parte superior del brazo (implante). El tubo puede permanecer en el lugar durante 3 años. El implante debe quitarse después de 3 años.  · Inyecciones que se aplican cada 3 meses.  · Píldoras que deben tomarse todos los días.  · Parches que se cambian una vez por semana.  · Un anillo que se coloca en la vagina (anillo vaginal). El anillo se deja en su lugar durante 3 semanas y se retira durante 1 semana. Luego se coloca un nuevo anillo en la vagina.  · Píldoras para el control de la natalidad después de tener sexo (relaciones sexuales) sin protección.    ANTICONCEPTIVOS DE BARRERA  · Una cubierta delgada que se usa sobe el pene (condón masculino) que se coloca durante las relaciones sexuales.  · Una cubierta blanda y suelta que se coloca en la vagina (condón femenino) antes de las relaciones sexuales.  · Un dispositivo de goma que se aplica sobre el cuello del útero (diafragma). Este dispositivo debe ser hecho para usted. Se coloca en la vagina antes de tener relaciones sexuales. Debe dejarlo colocado en la vagina durante 6 a 8 horas después de las relaciones sexuales.  · Un capuchón pequeño y suave que se fija sobre el cuello del útero (capuchón cervical). Este capuchón debe ser hecho para usted. Debe dejarlo colocado en la vagina durante 48 horas después de las relaciones sexuales.  · Una esponja que se coloca en la vagina antes de tener relaciones sexuales.  · Una sustancia química que destruye o impide que los espermatozoides ingresen al cuello y al útero (espermicida). La sustancia química puede ser en crema, gel, espuma o píldoras.    DISPOSITIVO DE CONTROL INTRAUTERINO (DIU)   · El DIU es un pequeño dispositivo plástico en forma de T. Se coloca dentro del útero. Hay dos tipos de DIU:  ? DIU de cobre. El dispositivo está recubierto en alambre de cobre. El cobre produce un líquido que destruye los espermatozoides. Puede permanecer colocado durante 10 años.  ? DIU hormonal. La hormona impide que ocurra el embarazo. Puede permanecer colocado durante 5 años.    MÉTODOS PERMANENTES  · La mujer puede hacerse sellar, ligar u obstruir las trompas de Falopio durante una cirugía. Esto impide que el óvulo llegue hasta el útero.  · El médico coloca un alambre diminuto o lo inserta en cada una de las trompas de Falopio. Esto produce un tejido cicatrizal que obstruye las trompas de Falopio.  · En el hombre pueden ligarse los conductos por los que pasan los espermatozoides (vasectomía).    CONTROL DE LA NATALIDAD POR PLANIFICACIÓN FAMILIAR NATURAL  · La planificación familiar natural significa no tener relaciones sexuales o usar un método anticonceptivo de barrera en los períodos fértiles de la mujer.  · Use a un almanaque para llevar un registro de la extensión de cada período y para conocer los días en los que puede quedar embarazada.  · Evite tener relaciones sexuales durante la ovulación.  · Use un termómetro para medir la temperatura corporal. También reconozca los síntomas de la ovulación.  · El momento de tener relaciones sexuales debe ser después de que la mujer haya ovulado.  Use condones para protegerse de las enfermedades de transmisión   sexual (ETS). Hágalo independientemente del tipo de anticonceptivo que use. Hable con su médico acerca de cuál es el mejor método anticonceptivo para usted.  Esta información no tiene como fin reemplazar el consejo del médico. Asegúrese de hacerle al médico cualquier pregunta que tenga.  Document Released: 10/08/2010 Document Revised: 06/28/2013 Document Reviewed: 01/12/2013  Elsevier Interactive Patient Education © 2017 [employer]

## 2016-05-22 NOTE — Progress Notes (Signed)
Subjective:     [facility] is a 25 y.o. female who presents for a postpartum visit. She is 7 weeks postpartum following a spontaneous vaginal delivery. I have fully reviewed the prenatal and intrapartum course. The delivery was at 33 gestational weeks. Outcome: vaginal. Anesthesia: Epidual. Postpartum course has been uncomplicated. Baby's course has been complicated by a 1 week NICU stay due to prematurity. Baby is feeding by breast and bottle - Similac. Bleeding yes. Bowel function is normal. Bladder function is normal . Patient is sexually active. Contraception method is desires none at this time. Postpartum depression screening: negative  The following portions of the patient's history were reviewed and updated as appropriate: allergies, current medications, past family history, past medical history, past social history, past surgical history and problem list.  Review of Systems Pertinent items are noted in HPI.   Objective:    BP 127/64   Pulse 72   Wt 158 lb 3.2 oz (71.8 kg)   BMI 27.15 kg/m   General:  alert and cooperative   Breasts:  not performed  Lungs: normal effort  Heart:  normal rate  Abdomen: soft   Vulva:  not evaluated  Vagina: not evaluated  Cervix:  not evaluated  Corpus: not examined  Adnexa:  not evaluated  Rectal Exam: Not performed.        Assessment:     Normal postpartum exam. Pap smear not done at today's visit.  Normal 10/2015  Plan:    1. Contraception: none 2. Patient desires BTL, discussed procedure with Dr. Adrian BlackwaterStinson (see note for additional information) 3. Follow up in: 3 months for Depo Provera or sooner as needed.    Marny LowensteinJulie N Recardo Linn, PA-C 05/22/2016 4:09 PM

## 2016-05-22 NOTE — Progress Notes (Signed)
Discussed BTL with patient. She would like it done, but has to wait 6 months. Will give depo shot, then return in 3 months to sign consent and second depo.

## 2016-08-07 ENCOUNTER — Ambulatory Visit (INDEPENDENT_AMBULATORY_CARE_PROVIDER_SITE_OTHER): Payer: Self-pay

## 2016-08-07 VITALS — BP 127/87 | HR 89 | Wt 165.1 lb

## 2016-08-07 DIAGNOSIS — Z3042 Encounter for surveillance of injectable contraceptive: Secondary | ICD-10-CM

## 2016-08-07 MED ORDER — LEVOTHYROXINE SODIUM 50 MCG PO TABS: 50.0000 ug | ORAL_TABLET | Freq: Every day | ORAL | 0 refills | Status: DC

## 2016-08-07 MED ORDER — MEDROXYPROGESTERONE ACETATE 150 MG/ML IM SUSP
150.0000 mg | Freq: Once | INTRAMUSCULAR | Status: AC
  Administered 2016-08-07: 150 mg via INTRAMUSCULAR

## 2016-08-07 MED ORDER — METFORMIN HCL 500 MG PO TABS: 500.0000 mg | ORAL_TABLET | Freq: Two times a day (BID) | ORAL | 0 refills | Status: AC

## 2016-08-07 NOTE — Progress Notes (Signed)
Pt requested a refill on her Metformin and Synthroid until she is able to find a PCP.  Dr. Shawnie PonsPratt agreed to one refill on medications.  [facility] contact information give to pt to call and schedule appt.

## 2016-08-20 ENCOUNTER — Other Ambulatory Visit: Payer: Self-pay | Admitting: Family Medicine
